# Patient Record
Sex: Female | Born: 1995 | Race: White | Hispanic: No | Marital: Married | Smoking: Former smoker
Health system: Southern US, Community
[De-identification: ages and names within clinical notes are randomized; demographics above are authoritative.]

## PROBLEM LIST (undated history)

## (undated) ENCOUNTER — Inpatient Hospital Stay (HOSPITAL_COMMUNITY): Payer: Self-pay

## (undated) DIAGNOSIS — Z872 Personal history of diseases of the skin and subcutaneous tissue: Secondary | ICD-10-CM

## (undated) DIAGNOSIS — R102 Pelvic and perineal pain unspecified side: Secondary | ICD-10-CM

## (undated) DIAGNOSIS — R591 Generalized enlarged lymph nodes: Secondary | ICD-10-CM

## (undated) DIAGNOSIS — O139 Gestational [pregnancy-induced] hypertension without significant proteinuria, unspecified trimester: Secondary | ICD-10-CM

## (undated) DIAGNOSIS — N926 Irregular menstruation, unspecified: Secondary | ICD-10-CM

## (undated) DIAGNOSIS — Z973 Presence of spectacles and contact lenses: Secondary | ICD-10-CM

## (undated) DIAGNOSIS — G8929 Other chronic pain: Secondary | ICD-10-CM

## (undated) DIAGNOSIS — N939 Abnormal uterine and vaginal bleeding, unspecified: Secondary | ICD-10-CM

## (undated) DIAGNOSIS — N83201 Unspecified ovarian cyst, right side: Secondary | ICD-10-CM

## (undated) DIAGNOSIS — N809 Endometriosis, unspecified: Secondary | ICD-10-CM

## (undated) DIAGNOSIS — N941 Unspecified dyspareunia: Secondary | ICD-10-CM

## (undated) DIAGNOSIS — Z8759 Personal history of other complications of pregnancy, childbirth and the puerperium: Secondary | ICD-10-CM

## (undated) DIAGNOSIS — N83202 Unspecified ovarian cyst, left side: Secondary | ICD-10-CM

## (undated) HISTORY — PX: TONSILLECTOMY: SUR1361

---
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
## 2013-06-06 ENCOUNTER — Encounter (HOSPITAL_COMMUNITY): Payer: Self-pay

## 2013-06-06 ENCOUNTER — Inpatient Hospital Stay (HOSPITAL_COMMUNITY)

## 2013-06-06 ENCOUNTER — Inpatient Hospital Stay (HOSPITAL_COMMUNITY)
Admission: AD | Admit: 2013-06-06 | Discharge: 2013-06-06 | Disposition: A | Discharge status: Home / Self Care | Source: Ambulatory Visit | Attending: Obstetrics and Gynecology | Admitting: Obstetrics and Gynecology

## 2013-06-06 DIAGNOSIS — Z87891 Personal history of nicotine dependence: Secondary | ICD-10-CM | POA: Insufficient documentation

## 2013-06-06 DIAGNOSIS — N83209 Unspecified ovarian cyst, unspecified side: Secondary | ICD-10-CM | POA: Insufficient documentation

## 2013-06-06 DIAGNOSIS — R1031 Right lower quadrant pain: Secondary | ICD-10-CM | POA: Insufficient documentation

## 2013-06-06 LAB — URINALYSIS, ROUTINE W REFLEX MICROSCOPIC
Bilirubin Urine: NEGATIVE
Glucose, UA: NEGATIVE mg/dL
Hgb urine dipstick: NEGATIVE
Ketones, ur: NEGATIVE mg/dL
Leukocytes, UA: NEGATIVE
Nitrite: NEGATIVE
Protein, ur: NEGATIVE mg/dL
Specific Gravity, Urine: 1.01 (ref 1.005–1.030)
Urobilinogen, UA: 0.2 mg/dL (ref 0.0–1.0)
pH: 6 (ref 5.0–8.0)

## 2013-06-06 MED ORDER — HYDROMORPHONE HCL PF 1 MG/ML IJ SOLN
1.0000 mg | Freq: Once | INTRAMUSCULAR | Status: AC
  Administered 2013-06-06: 1 mg via INTRAMUSCULAR
  Filled 2013-06-06: qty 1

## 2013-06-06 NOTE — MAU Provider Note (Signed)
History     CSN: [id number]  Arrival date and time: 06/06/13 1911   First Provider Initiated Contact with Patient 06/06/13 1945      Chief Complaint  Patient presents with  . Abdominal Pain   HPI [name] is a 17 y.o. G0P0 who presents to MAU today with complaint of RLQ pain. The patient has a known ovarian cyst seen on [place] in the office recently. She states that her pain became worse yesterday and then much worse this afternoon. She rates her pain at 8/10 now. She denies vaginal bleeding, but has noted a new clear, white discharge since earlier today. She states recent temp of 99 F earlier today, but no true fever.   OB History   Grav Para Term Preterm Abortions TAB SAB Ect Mult Living   0               Past Medical History  Diagnosis Date  . No pertinent past medical history   . Bilateral ovarian cysts     Past Surgical History  Procedure Laterality Date  . Laparoscopy  11/08/2011    Procedure: LAPAROSCOPY OPERATIVE;  Surgeon: Levi Aland, MD;  Location: [facility];  Service: Gynecology;  Laterality: Right;  . Ovarian cyst removal  11/08/2011    Procedure: OVARIAN CYSTECTOMY;  Surgeon: Levi Aland, MD;  Location: [facility];  Service: Gynecology;  Laterality: Right;    History reviewed. No pertinent family history.  History  Substance Use Topics  . Smoking status: Former Games developer  . Smokeless tobacco: Never Used  . Alcohol Use: No    Allergies:  Allergies  Allergen Reactions  . Peanuts [Peanut Oil] Anaphylaxis and Hives    Allergic to all nuts  . Latex Rash    Prescriptions prior to admission  Medication Sig Dispense Refill  . ibuprofen (ADVIL,MOTRIN) 800 MG tablet Take 1 tablet (800 mg total) by mouth 3 (three) times daily.  21 tablet  0  . HYDROcodone-acetaminophen (NORCO/VICODIN) 5-325 MG per tablet Take 1-2 tablets by mouth every 6 (six) hours as needed for pain.  20 tablet  0    Review of Systems  Constitutional: Negative for fever and  malaise/fatigue.  Gastrointestinal: Positive for nausea and abdominal pain. Negative for vomiting.  Genitourinary:       Neg - vaginal bleeding + vaginal discharge   Physical Exam   Blood pressure 138/78, pulse 106, temperature 98.6 F (37 C), temperature source Oral, resp. rate 20, height 5\' 11"  (1.803 m), weight 224 lb 6.4 oz (101.787 kg), SpO2 100.00%.  Physical Exam  Constitutional: She is oriented to person, place, and time. She appears well-developed and well-nourished. She appears distressed (appears uncomfortable).  HENT:  Head: Normocephalic and atraumatic.  Cardiovascular: Normal rate, regular rhythm and normal heart sounds.   Respiratory: Effort normal and breath sounds normal. No respiratory distress.  GI: Soft. Bowel sounds are normal. She exhibits no distension and no mass. There is tenderness (mild tenderness to palpation of the RLQ). There is no rebound and no guarding.  Neurological: She is alert and oriented to person, place, and time.  Skin: Skin is warm and dry. No erythema.  Psychiatric: She has a normal mood and affect.   Results for orders placed during the hospital encounter of 06/06/13 (from the past 24 hour(s))  URINALYSIS, ROUTINE W REFLEX MICROSCOPIC     Status: Abnormal   Collection Time    06/06/13  7:34 PM      Result Value  Range   Color, Urine STRAW (*) YELLOW   APPearance CLEAR  CLEAR   Specific Gravity, Urine 1.010  1.005 - 1.030   pH 6.0  5.0 - 8.0   Glucose, UA NEGATIVE  NEGATIVE mg/dL   Hgb urine dipstick NEGATIVE  NEGATIVE   Bilirubin Urine NEGATIVE  NEGATIVE   Ketones, ur NEGATIVE  NEGATIVE mg/dL   Protein, ur NEGATIVE  NEGATIVE mg/dL   Urobilinogen, UA 0.2  0.0 - 1.0 mg/dL   Nitrite NEGATIVE  NEGATIVE   Leukocytes, UA NEGATIVE  NEGATIVE  POCT PREGNANCY, URINE     Status: None   Collection Time    06/06/13  7:42 PM      Result Value Range   Preg Test, Ur NEGATIVE  NEGATIVE    MAU Course  Procedures None  MDM Discussed patient  with Dr. Claiborne Billings. Order pelvic US and 1 mg IM dilaudid.   2000 - Care turned over to [facility], CNM   Freddi Starr, PA-C  06/06/2013, 8:03 PM   Dorathy Kinsman, CNM assumed care of patient at 8 PM.  Pain resolved with Dilaudid.  US Transvaginal Non-ob  06/06/2013   CLINICAL DATA:  Right lower quadrant pelvic pain  EXAM: TRANSABDOMINAL AND TRANSVAGINAL ULTRASOUND OF PELVIS  TECHNIQUE: Both transabdominal and transvaginal ultrasound examinations of the pelvis were performed. Transabdominal technique was performed for global imaging of the pelvis including uterus, ovaries, adnexal regions, and pelvic cul-de-sac. It was necessary to proceed with endovaginal exam following the transabdominal exam to visualize the uterus, endometrium and the adnexa in better detail.  COMPARISON:  12/25/2012  FINDINGS: Uterus  Measurements: 6.3 x 2.8 x 3.6 cm. No fibroids or other mass visualized.  Endometrium  Thickness: 4.5 mm. No focal abnormality. Unremarkable appearance by ultrasound  Right ovary  Measurements: 2.4 x 1.4 x 1.3 cm. Normal appearance/no adnexal mass.  Left ovary  Measurements: 3.3 x 1.4 x 1.5 cm. Normal appearance/no adnexal mass.  Other findings  No free fluid.  IMPRESSION: Normal pelvic ultrasound. No acute finding.   Electronically Signed   By: Ruel Favors M.D.   On: 06/06/2013 21:08   US Pelvis Complete  06/06/2013   CLINICAL DATA:  Right lower quadrant pelvic pain  EXAM: TRANSABDOMINAL AND TRANSVAGINAL ULTRASOUND OF PELVIS  TECHNIQUE: Both transabdominal and transvaginal ultrasound examinations of the pelvis were performed. Transabdominal technique was performed for global imaging of the pelvis including uterus, ovaries, adnexal regions, and pelvic cul-de-sac. It was necessary to proceed with endovaginal exam following the transabdominal exam to visualize the uterus, endometrium and the adnexa in better detail.  COMPARISON:  12/25/2012  FINDINGS: Uterus  Measurements: 6.3 x 2.8 x 3.6 cm. No  fibroids or other mass visualized.  Endometrium  Thickness: 4.5 mm. No focal abnormality. Unremarkable appearance by ultrasound  Right ovary  Measurements: 2.4 x 1.4 x 1.3 cm. Normal appearance/no adnexal mass.  Left ovary  Measurements: 3.3 x 1.4 x 1.5 cm. Normal appearance/no adnexal mass.  Other findings  No free fluid.  IMPRESSION: Normal pelvic ultrasound. No acute finding.   Electronically Signed   By: Ruel Favors M.D.   On: 06/06/2013 21:08   Assessment and Plan   1. Ovarian cyst rupture    PLAN: Discharge home per consult with Dr. Claiborne Billings. Continue ibuprofen for pain management. Follow-up Information   Follow up with HORVATH,MICHELLE A, MD. (as scheduled or as needed if symptoms worsen)    Specialty:  Obstetrics and Gynecology   Contact information:  [address] [place] [address] [phone]       Follow up with [facility] [place] [facility]. (As needed in emergencies)    Contact information:   [address] [id number] [place] [address] [phone]       Medication List    STOP taking these medications       HYDROcodone-acetaminophen 5-325 MG per tablet  Commonly known as:  NORCO/VICODIN      TAKE these medications       ibuprofen 800 MG tablet  Commonly known as:  ADVIL,MOTRIN  Take 1 tablet (800 mg total) by mouth 3 (three) times daily.        Mariano Colan, CNM 06/07/2013 5:17 AM

## 2013-06-06 NOTE — MAU Note (Signed)
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 Fever tonight, temp 100 at home. No URI or cold symptoms. Vaginal spotting that pt relates to her birth control (nuvaring). White, thick discharge today, no odor. LMP 3 months ago, since using nuvaring.

## 2013-06-07 NOTE — MAU Provider Note (Signed)
agree

## 2013-07-03 ENCOUNTER — Emergency Department (HOSPITAL_COMMUNITY)
Admission: EM | Admit: 2013-07-03 | Discharge: 2013-07-03 | Disposition: A | Discharge status: Home / Self Care | Attending: Emergency Medicine | Admitting: Emergency Medicine

## 2013-07-03 ENCOUNTER — Emergency Department (HOSPITAL_COMMUNITY)

## 2013-07-03 ENCOUNTER — Encounter (HOSPITAL_COMMUNITY): Payer: Self-pay | Admitting: Emergency Medicine

## 2013-07-03 DIAGNOSIS — Z3202 Encounter for pregnancy test, result negative: Secondary | ICD-10-CM | POA: Insufficient documentation

## 2013-07-03 DIAGNOSIS — Z87891 Personal history of nicotine dependence: Secondary | ICD-10-CM | POA: Insufficient documentation

## 2013-07-03 DIAGNOSIS — Z791 Long term (current) use of non-steroidal anti-inflammatories (NSAID): Secondary | ICD-10-CM | POA: Insufficient documentation

## 2013-07-03 DIAGNOSIS — R197 Diarrhea, unspecified: Secondary | ICD-10-CM

## 2013-07-03 DIAGNOSIS — R109 Unspecified abdominal pain: Secondary | ICD-10-CM

## 2013-07-03 DIAGNOSIS — R1031 Right lower quadrant pain: Secondary | ICD-10-CM | POA: Insufficient documentation

## 2013-07-03 DIAGNOSIS — Z8742 Personal history of other diseases of the female genital tract: Secondary | ICD-10-CM | POA: Insufficient documentation

## 2013-07-03 DIAGNOSIS — Z91040 Latex allergy status: Secondary | ICD-10-CM | POA: Insufficient documentation

## 2013-07-03 DIAGNOSIS — R1110 Vomiting, unspecified: Secondary | ICD-10-CM

## 2013-07-03 DIAGNOSIS — R112 Nausea with vomiting, unspecified: Secondary | ICD-10-CM | POA: Insufficient documentation

## 2013-07-03 DIAGNOSIS — R509 Fever, unspecified: Secondary | ICD-10-CM | POA: Insufficient documentation

## 2013-07-03 LAB — URINE MICROSCOPIC-ADD ON

## 2013-07-03 LAB — URINALYSIS, ROUTINE W REFLEX MICROSCOPIC
Bilirubin Urine: NEGATIVE
Hgb urine dipstick: NEGATIVE
Ketones, ur: NEGATIVE mg/dL
Nitrite: NEGATIVE
Specific Gravity, Urine: 1.008 (ref 1.005–1.030)
Urobilinogen, UA: 0.2 mg/dL (ref 0.0–1.0)

## 2013-07-03 LAB — CBC WITH DIFFERENTIAL/PLATELET
Basophils Relative: 0 % (ref 0–1)
Eosinophils Absolute: 0.1 10*3/uL (ref 0.0–0.7)
Eosinophils Relative: 1 % (ref 0–5)
HCT: 33.8 % — ABNORMAL LOW (ref 36.0–46.0)
Hemoglobin: 10.8 g/dL — ABNORMAL LOW (ref 12.0–15.0)
Lymphs Abs: 2.8 10*3/uL (ref 0.7–4.0)
MCH: 24.3 pg — ABNORMAL LOW (ref 26.0–34.0)
MCHC: 32 g/dL (ref 30.0–36.0)
MCV: 76 fL — ABNORMAL LOW (ref 78.0–100.0)
Monocytes Absolute: 0.7 10*3/uL (ref 0.1–1.0)
Monocytes Relative: 9 % (ref 3–12)
RBC: 4.45 MIL/uL (ref 3.87–5.11)

## 2013-07-03 LAB — BASIC METABOLIC PANEL
BUN: 7 mg/dL (ref 6–23)
Calcium: 9.3 mg/dL (ref 8.4–10.5)
Creatinine, Ser: 0.73 mg/dL (ref 0.50–1.10)
GFR calc Af Amer: >90 mL/min (ref >90)
GFR calc non Af Amer: >90 mL/min (ref >90)
Glucose, Bld: 114 mg/dL — ABNORMAL HIGH (ref 70–99)
Potassium: 3.3 mEq/L — ABNORMAL LOW (ref 3.5–5.1)

## 2013-07-03 MED ORDER — IOHEXOL 300 MG/ML  SOLN
50.0000 mL | Freq: Once | INTRAMUSCULAR | Status: AC | PRN
  Administered 2013-07-03: 50 mL via INTRAVENOUS

## 2013-07-03 MED ORDER — HYDROCODONE-ACETAMINOPHEN 5-325 MG PO TABS: 2.0000 | ORAL_TABLET | ORAL | Status: DC | PRN

## 2013-07-03 MED ORDER — SODIUM CHLORIDE 0.9 % IV BOLUS (SEPSIS)
1000.0000 mL | Freq: Once | INTRAVENOUS | Status: AC
  Administered 2013-07-03: 1000 mL via INTRAVENOUS

## 2013-07-03 MED ORDER — ONDANSETRON HCL 4 MG/2ML IJ SOLN
4.0000 mg | Freq: Once | INTRAMUSCULAR | Status: AC
  Administered 2013-07-03: 4 mg via INTRAVENOUS
  Filled 2013-07-03: qty 2

## 2013-07-03 MED ORDER — ONDANSETRON HCL 4 MG PO TABS: 4.0000 mg | ORAL_TABLET | Freq: Four times a day (QID) | ORAL | Status: DC

## 2013-07-03 MED ORDER — MORPHINE SULFATE 4 MG/ML IJ SOLN
4.0000 mg | Freq: Once | INTRAMUSCULAR | Status: AC
  Administered 2013-07-03: 4 mg via INTRAVENOUS
  Filled 2013-07-03: qty 1

## 2013-07-03 MED ORDER — DIPHENOXYLATE-ATROPINE 2.5-0.025 MG PO TABS: 2.0000 | ORAL_TABLET | Freq: Four times a day (QID) | ORAL | Status: DC | PRN

## 2013-07-03 MED ORDER — IOHEXOL 300 MG/ML  SOLN
100.0000 mL | Freq: Once | INTRAMUSCULAR | Status: AC | PRN
  Administered 2013-07-03: 100 mL via INTRAVENOUS

## 2013-07-03 NOTE — ED Notes (Signed)
Pt has right sided abd pain and vomiting since Sunday has been nauseous every since 28th of Nov. States eating makes the pain worse. [facility] [address] and was told to come hereto rule out appendicitis.

## 2013-07-03 NOTE — ED Provider Notes (Signed)
CSN: [id number]     Arrival date & time 07/03/13  1718 History   First MD Initiated Contact with Patient 07/03/13 1757     Chief Complaint  Patient presents with  . Abdominal Pain  . Nausea  . Emesis   (Consider location/radiation/quality/duration/timing/severity/associated sxs/prior Treatment) HPI Comments: Patient presents to the ER for evaluation of right-sided abdominal pain. Patient has had 2 days of abdominal pain with nausea, vomiting and slight diarrhea. Initially the pain was around her umbilicus area, and now in the right lower area of the abdomen. Pain has been progressively worsening, now moderate to severe. Pain is continuous. She has been running a fever as high as 101.  Patient is a 17 y.o. female presenting with abdominal pain and vomiting.  Abdominal Pain Associated symptoms: diarrhea, fever, nausea and vomiting   Emesis Associated symptoms: abdominal pain and diarrhea     Past Medical History  Diagnosis Date  . No pertinent past medical history   . Bilateral ovarian cysts    Past Surgical History  Procedure Laterality Date  . Laparoscopy  11/08/2011    Procedure: LAPAROSCOPY OPERATIVE;  Surgeon: Levi Aland, MD;  Location: [facility];  Service: Gynecology;  Laterality: Right;  . Ovarian cyst removal  11/08/2011    Procedure: OVARIAN CYSTECTOMY;  Surgeon: Levi Aland, MD;  Location: [facility];  Service: Gynecology;  Laterality: Right;   No family history on file. History  Substance Use Topics  . Smoking status: Former Games developer  . Smokeless tobacco: Never Used  . Alcohol Use: No   OB History   Grav Para Term Preterm Abortions TAB SAB Ect Mult Living   0              Review of Systems  Constitutional: Positive for fever.  Gastrointestinal: Positive for nausea, vomiting, abdominal pain and diarrhea.    Allergies  Peanuts and Latex  Home Medications   Current Outpatient Rx  Name  Route  Sig  Dispense  Refill  . ibuprofen (ADVIL,MOTRIN) 800 MG tablet  Oral   Take 1 tablet (800 mg total) by mouth 3 (three) times daily.   21 tablet   0    BP 143/73  Pulse 118  Temp(Src) 98.4 F (36.9 C) (Oral)  Resp 20  SpO2 100% Physical Exam  Constitutional: She is oriented to person, place, and time. She appears well-developed and well-nourished. No distress.  HENT:  Head: Normocephalic and atraumatic.  Right Ear: Hearing normal.  Left Ear: Hearing normal.  Nose: Nose normal.  Mouth/Throat: Oropharynx is clear and moist and mucous membranes are normal.  Eyes: Conjunctivae and EOM are normal. Pupils are equal, round, and reactive to light.  Neck: Normal range of motion. Neck supple.  Cardiovascular: Regular rhythm, S1 normal and S2 normal.  Exam reveals no gallop and no friction rub.   No murmur heard. Pulmonary/Chest: Effort normal and breath sounds normal. No respiratory distress. She exhibits no tenderness.  Abdominal: Soft. Normal appearance and bowel sounds are normal. There is no hepatosplenomegaly. There is tenderness in the right lower quadrant. There is guarding. There is no rebound, no tenderness at McBurney's point and negative Murphy's sign. No hernia.  Musculoskeletal: Normal range of motion.  Neurological: She is alert and oriented to person, place, and time. She has normal strength. No cranial nerve deficit or sensory deficit. Coordination normal. GCS eye subscore is 4. GCS verbal subscore is 5. GCS motor subscore is 6.  Skin: Skin is warm, dry and intact.  No rash noted. No cyanosis.  Psychiatric: She has a normal mood and affect. Her speech is normal and behavior is normal. Thought content normal.    ED Course  Procedures (including critical care time) Labs Review Labs Reviewed  CBC WITH DIFFERENTIAL - Abnormal; Notable for the following:    Hemoglobin 10.8 (*)    HCT 33.8 (*)    MCV 76.0 (*)    MCH 24.3 (*)    All other components within normal limits  BASIC METABOLIC PANEL - Abnormal; Notable for the following:     Potassium 3.3 (*)    Glucose, Bld 114 (*)    All other components within normal limits  URINALYSIS, ROUTINE W REFLEX MICROSCOPIC - Abnormal; Notable for the following:    Leukocytes, UA TRACE (*)    All other components within normal limits  URINE MICROSCOPIC-ADD ON - Abnormal; Notable for the following:    Bacteria, UA FEW (*)    All other components within normal limits  POCT PREGNANCY, URINE   Imaging Review Ct Abdomen Pelvis W Contrast  07/03/2013   CLINICAL DATA:  Right-sided abdomen pain  EXAM: CT ABDOMEN AND PELVIS WITH CONTRAST  TECHNIQUE: Multidetector CT imaging of the abdomen and pelvis was performed using the standard protocol following bolus administration of intravenous contrast.  CONTRAST:  50mL OMNIPAQUE IOHEXOL 300 MG/ML SOLN, OMNIPAQUE IOHEXOL 300 MG/ML SOLN  COMPARISON:  September 13, 2012  FINDINGS: The liver, spleen, pancreas, gallbladder, adrenal glands and kidneys are normal. The aorta is normal. There is no abdominal lymphadenopathy. There is no small bowel obstruction or diverticulitis. The appendix is normal. There is minimal umbilical herniation of mesenteric fat.  Fluid-filled bladder is normal. The uterus is normal. The bilateral ovaries are normal. Minimal free fluid is identified in the right pelvis, likely physiologic. The lung bases are clear. No acute abnormalities identified within the visualized bones.  IMPRESSION: Normal appendix. No acute abnormality identified in the abdomen and pelvis. Minimal free fluid in the right pelvis, likely physiologic.   Electronically Signed   By: Sherian Rein M.D.   On: 07/03/2013 20:28    EKG Interpretation   None       MDM  Diagnsosis: Abdominal pain, vomiting, diarrhea  Patient presents to the ER for evaluation of fever, abdominal pain. Patient reports that the pain was initially diffuse in the middle of the abdomen, now in the lower abdomen, currently on the right. She has been sick for 2 or 3 days including fever.  She has had GI symptoms including vomiting and diarrhea. She feels constipated. Based on the presentation and the area of tenderness, appendicitis was considered. CAT scan was performed and shows no evidence of acute appendicitis. Appendix was normal. There were no abnormalities seen.  Patient has had previous cyst. Based on the fever or GI symptoms, however, do not suspect gynecologic origin. Symptoms are consistent with likely viral syndrome. She was hydrated in the ER. She will be continued on Zofran, Lomotil as needed.    Gilda Crease, MD 07/03/13 2045

## 2013-07-03 NOTE — ED Notes (Signed)
Patient back from CT.  Patient in NAD.

## 2013-07-03 NOTE — ED Notes (Signed)
MD informed that patient states "The Morphine really didn't help me any."

## 2013-07-03 NOTE — ED Notes (Signed)
Assumed care of patient Patient up to bathroom to provide urine specimen Patient states that pain still present, but has decreased since admin of medication, see MAR Warm blanket given Patient and pt's visitor deny further needs at this time Side rails up, call bell in reach

## 2013-07-03 NOTE — Progress Notes (Signed)
   CARE MANAGEMENT ED NOTE 07/03/2013  Patient:  Chelsea Day, Chelsea Day   Account Number:  [id number]  Date Initiated:  07/03/2013  Documentation initiated by:  Radford Pax  Subjective/Objective Assessment:   Patient presents to Ed with abdominal pain and nausea and vomitting.     Subjective/Objective Assessment Detail:     Action/Plan:   Action/Plan Detail:   Anticipated DC Date:       Status Recommendation to Physician:   Result of Recommendation:    Other ED Services  Consult Working Plan    DC Planning Services  Other  PCP issues    Choice offered to / List presented to:            Status of service:  Completed, signed off  ED Comments:   ED Comments Detail:  EDCM spoke to patient at bedside.  Patient reports she does not have a pcp.  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  EDCM instructed patient to call the phone number on back of insurance card or go to insurance company website to help her find a physician who is close to her and within network.

## 2013-07-03 NOTE — ED Notes (Signed)
MD at bedside. 

## 2013-08-20 ENCOUNTER — Inpatient Hospital Stay (HOSPITAL_COMMUNITY)
Admission: AD | Admit: 2013-08-20 | Discharge: 2013-08-20 | Disposition: A | Discharge status: Home / Self Care | Source: Ambulatory Visit | Attending: Obstetrics and Gynecology | Admitting: Obstetrics and Gynecology

## 2013-08-20 ENCOUNTER — Inpatient Hospital Stay (HOSPITAL_COMMUNITY)

## 2013-08-20 ENCOUNTER — Encounter (HOSPITAL_COMMUNITY): Payer: Self-pay | Admitting: *Deleted

## 2013-08-20 DIAGNOSIS — R300 Dysuria: Secondary | ICD-10-CM | POA: Insufficient documentation

## 2013-08-20 DIAGNOSIS — Z87891 Personal history of nicotine dependence: Secondary | ICD-10-CM | POA: Insufficient documentation

## 2013-08-20 DIAGNOSIS — R350 Frequency of micturition: Secondary | ICD-10-CM | POA: Insufficient documentation

## 2013-08-20 DIAGNOSIS — R1031 Right lower quadrant pain: Secondary | ICD-10-CM

## 2013-08-20 HISTORY — DX: Irregular menstruation, unspecified: N92.6

## 2013-08-20 LAB — WET PREP, GENITAL
CLUE CELLS WET PREP: NONE SEEN
Trich, Wet Prep: NONE SEEN
YEAST WET PREP: NONE SEEN

## 2013-08-20 LAB — POCT PREGNANCY, URINE: Preg Test, Ur: NEGATIVE

## 2013-08-20 LAB — URINALYSIS, ROUTINE W REFLEX MICROSCOPIC
Bilirubin Urine: NEGATIVE
GLUCOSE, UA: NEGATIVE mg/dL
KETONES UR: NEGATIVE mg/dL
Leukocytes, UA: NEGATIVE
Nitrite: NEGATIVE
PH: 6 (ref 5.0–8.0)
Protein, ur: NEGATIVE mg/dL
SPECIFIC GRAVITY, URINE: 1.01 (ref 1.005–1.030)
Urobilinogen, UA: 0.2 mg/dL (ref 0.0–1.0)

## 2013-08-20 LAB — URINE MICROSCOPIC-ADD ON

## 2013-08-20 MED ORDER — OXYCODONE-ACETAMINOPHEN 5-325 MG PO TABS: 1.0000 | ORAL_TABLET | ORAL | Status: DC | PRN

## 2013-08-20 MED ORDER — OXYCODONE-ACETAMINOPHEN 5-325 MG PO TABS
1.0000 | ORAL_TABLET | Freq: Once | ORAL | Status: AC
  Administered 2013-08-20: 1 via ORAL
  Filled 2013-08-20: qty 1

## 2013-08-20 NOTE — MAU Provider Note (Signed)
History     CSN: [id number]  Arrival date and time: 08/20/13 2017   First Provider Initiated Contact with Patient 08/20/13 2057      No chief complaint on file.  HPI [name] is a 18 y.o. G0P0 who presents to MAU today with complaint of RLQ pain and "black-red discharge." The patient states that she uses Nuvaring continuously for bleeding control and history of ovarian cysts. She usually has light pink spotting at some point during the month, but has never noted this kind of darker discharge and not in this quantity. She states that she had to use 2 tampons earlier today. She is also complaining of increased frequency and dysuria. She denies fever or flank pain. The patient states that she is virgin.   OB History   Grav Para Term Preterm Abortions TAB SAB Ect Mult Living   0               Past Medical History  Diagnosis Date  . No pertinent past medical history   . Bilateral ovarian cysts   . Irregular periods/menstrual cycles     Past Surgical History  Procedure Laterality Date  . Laparoscopy  11/08/2011    Procedure: LAPAROSCOPY OPERATIVE;  Surgeon: Olga Millers, MD;  Location: [place] [facility];  Service: Gynecology;  Laterality: Right;  . Ovarian cyst removal  11/08/2011    Procedure: OVARIAN CYSTECTOMY;  Surgeon: Olga Millers, MD;  Location: [place] [facility];  Service: Gynecology;  Laterality: Right;    History reviewed. No pertinent family history.  History  Substance Use Topics  . Smoking status: Former Smoker    Types: Cigarettes  . Smokeless tobacco: Never Used  . Alcohol Use: No    Allergies:  Allergies  Allergen Reactions  . Peanuts [Peanut Oil] Anaphylaxis and Hives    Allergic to all nuts  . Latex Rash    Prescriptions prior to admission  Medication Sig Dispense Refill  . ibuprofen (ADVIL,MOTRIN) 800 MG tablet Take 800 mg by mouth every 8 (eight) hours as needed for fever, headache, mild pain, moderate pain or cramping.      . [DISCONTINUED] ibuprofen  (ADVIL,MOTRIN) 800 MG tablet Take 1 tablet (800 mg total) by mouth 3 (three) times daily.  21 tablet  0    Review of Systems  Constitutional: Negative for fever and malaise/fatigue.  Gastrointestinal: Positive for nausea and abdominal pain. Negative for vomiting, diarrhea and constipation.  Genitourinary: Positive for dysuria, urgency and frequency. Negative for hematuria and flank pain.       + vaginal bleeding Neg - vaginal discharge   Physical Exam   Blood pressure 135/74, pulse 78, temperature 98.7 F (37.1 C), temperature source Oral, resp. rate 78, height 5\' 10"  (1.778 m), weight 220 lb (99.791 kg).  Physical Exam  Constitutional: She is oriented to person, place, and time. She appears well-developed and well-nourished. No distress.  HENT:  Head: Normocephalic and atraumatic.  Cardiovascular: Normal rate.   Respiratory: Effort normal.  GI: Soft. Bowel sounds are normal. She exhibits no distension and no mass. There is no tenderness. There is no rebound, no guarding and no CVA tenderness.  Genitourinary: There is bleeding (scant dark brown blood noted) around the vagina. No vaginal discharge found.  Neurological: She is alert and oriented to person, place, and time.  Skin: Skin is warm and dry. No erythema.  Psychiatric: She has a normal mood and affect.   Results for orders placed during the hospital encounter of 08/20/13 (  from the past 24 hour(s))  URINALYSIS, ROUTINE W REFLEX MICROSCOPIC     Status: Abnormal   Collection Time    08/20/13  8:21 PM      Result Value Range   Color, Urine YELLOW  YELLOW   APPearance CLEAR  CLEAR   Specific Gravity, Urine 1.010  1.005 - 1.030   pH 6.0  5.0 - 8.0   Glucose, UA NEGATIVE  NEGATIVE mg/dL   Hgb urine dipstick LARGE (*) NEGATIVE   Bilirubin Urine NEGATIVE  NEGATIVE   Ketones, ur NEGATIVE  NEGATIVE mg/dL   Protein, ur NEGATIVE  NEGATIVE mg/dL   Urobilinogen, UA 0.2  0.0 - 1.0 mg/dL   Nitrite NEGATIVE  NEGATIVE   Leukocytes, UA  NEGATIVE  NEGATIVE  URINE MICROSCOPIC-ADD ON     Status: None   Collection Time    08/20/13  8:21 PM      Result Value Range   Squamous Epithelial / LPF RARE  RARE   WBC, UA 0-2  <3 WBC/hpf   RBC / HPF 0-2  <3 RBC/hpf   Bacteria, UA RARE  RARE  POCT PREGNANCY, URINE     Status: None   Collection Time    08/20/13  8:30 PM      Result Value Range   Preg Test, Ur NEGATIVE  NEGATIVE  WET PREP, GENITAL     Status: Abnormal   Collection Time    08/20/13  9:05 PM      Result Value Range   Yeast Wet Prep HPF POC NONE SEEN  NONE SEEN   Trich, Wet Prep NONE SEEN  NONE SEEN   Clue Cells Wet Prep HPF POC NONE SEEN  NONE SEEN   WBC, Wet Prep HPF POC FEW (*) NONE SEEN   US Transvaginal Non-ob  08/20/2013   CLINICAL DATA:  Right lower quadrant abdominal pain.  EXAM: TRANSVAGINAL ULTRASOUND OF PELVIS  TECHNIQUE: Transvaginal ultrasound examination of the pelvis was performed including evaluation of the uterus, ovaries, adnexal regions, and pelvic cul-de-sac.  COMPARISON:  CT ABD/PELVIS W CM dated 07/03/2013; US PELVIS COMPLETE dated 06/06/2013  FINDINGS: Uterus  Measurements: 5.4 x 3.1 x 4.1 cm. No fibroids or other mass visualized.  Endometrium  Thickness: 0.5 cm.  No focal abnormality visualized.  Right ovary  Measurements: 2.1 x 1.1 x 1.7 cm. Normal appearance/no adnexal mass.  Left ovary  Measurements: 2.1 x 1.5 x 1.7 cm. Normal appearance/no adnexal mass.  Other findings: A small amount of free fluid is seen within the pelvic cul-de-sac.  IMPRESSION: Unremarkable pelvic ultrasound.   Electronically Signed   By: Garald Balding M.D.   On: 08/20/2013 22:53   US Pelvis Limited  08/20/2013   CLINICAL DATA:  Right lower quadrant abdominal pain.  EXAM: LIMITED ABDOMINAL ULTRASOUND  TECHNIQUE: Pearline Cables scale imaging of the right lower quadrant was performed to evaluate for suspected appendicitis. Standard imaging planes and graded compression technique were utilized.  COMPARISON:  None.  FINDINGS: The appendix is  not visualized.  Ancillary findings: None.  Factors affecting image quality: Patient habitus and overlying bowel loops.  IMPRESSION: No appendix, focal fluid collection or other abnormality seen. Evaluation limited due to the patient's habitus and overlying bowel loops.   Electronically Signed   By: Garald Balding M.D.   On: 08/20/2013 22:47     MAU Course  Procedures None  MDM UPT - negative UA and wet prep today Ordered Pelvic and abdominal US Dr. Harrington Challenger called to MAU. She has  reviewed the patient's chart and noted: 05/2013 - patient was seen in her office and had [place] that showed a ? Dermoid cyst 06/2013 - patient was seen in MAU and admitted for a rupture ovarian cyst 07/2013 - patient seen at [facility] for RLQ pain. Patient had abdominal CT scan that was normal and showed no cyst Assessment and Plan  A: Abdominal pain, RLQ  P: Discharge home Rx for Percocet given to the patient Patient advised to increase PO hydration as tolerated Patient advised to keep appointment for follow-up with Dr. Philis Pique on 08/29/13 or call for earlier appointment if symptoms worsen Patient may return to MAU as needed or if her condition were to change or worsen  Farris Has, PA-C  08/20/2013, 11:56 PM

## 2013-08-20 NOTE — MAU Note (Signed)
Pt c.o black/red discharge for the past few days with  Lower abd pain that started today; took 800mg  ibuprofen without any relief.

## 2013-08-20 NOTE — Discharge Instructions (Signed)
Abdominal Pain, Women °Abdominal (stomach, pelvic, or belly) pain can be caused by many things. It is important to tell your doctor: °· The location of the pain. °· Does it come and go or is it present all the time? °· Are there things that start the pain (eating certain foods, exercise)? °· Are there other symptoms associated with the pain (fever, nausea, vomiting, diarrhea)? °All of this is helpful to know when trying to find the cause of the pain. °CAUSES  °· Stomach: virus or bacteria infection, or ulcer. °· Intestine: appendicitis (inflamed appendix), regional ileitis (Crohn's disease), ulcerative colitis (inflamed colon), irritable bowel syndrome, diverticulitis (inflamed diverticulum of the colon), or cancer of the stomach or intestine. °· Gallbladder disease or stones in the gallbladder. °· Kidney disease, kidney stones, or infection. °· Pancreas infection or cancer. °· Fibromyalgia (pain disorder). °· Diseases of the female organs: °· Uterus: fibroid (non-cancerous) tumors or infection. °· Fallopian tubes: infection or tubal pregnancy. °· Ovary: cysts or tumors. °· Pelvic adhesions (scar tissue). °· Endometriosis (uterus lining tissue growing in the pelvis and on the pelvic organs). °· Pelvic congestion syndrome (female organs filling up with blood just before the menstrual period). °· Pain with the menstrual period. °· Pain with ovulation (producing an egg). °· Pain with an IUD (intrauterine device, birth control) in the uterus. °· Cancer of the female organs. °· Functional pain (pain not caused by a disease, may improve without treatment). °· Psychological pain. °· Depression. °DIAGNOSIS  °Your doctor will decide the seriousness of your pain by doing an examination. °· Blood tests. °· X-rays. °· Ultrasound. °· CT scan (computed tomography, special type of X-ray). °· MRI (magnetic resonance imaging). °· Cultures, for infection. °· Barium enema (dye inserted in the large intestine, to better view it with  X-rays). °· Colonoscopy (looking in intestine with a lighted tube). °· Laparoscopy (minor surgery, looking in abdomen with a lighted tube). °· Major abdominal exploratory surgery (looking in abdomen with a large incision). °TREATMENT  °The treatment will depend on the cause of the pain.  °· Many cases can be observed and treated at home. °· Over-the-counter medicines recommended by your caregiver. °· Prescription medicine. °· Antibiotics, for infection. °· Birth control pills, for painful periods or for ovulation pain. °· Hormone treatment, for endometriosis. °· Nerve blocking injections. °· Physical therapy. °· Antidepressants. °· Counseling with a psychologist or psychiatrist. °· Minor or major surgery. °HOME CARE INSTRUCTIONS  °· Do not take laxatives, unless directed by your caregiver. °· Take over-the-counter pain medicine only if ordered by your caregiver. Do not take aspirin because it can cause an upset stomach or bleeding. °· Try a clear liquid diet (broth or water) as ordered by your caregiver. Slowly move to a bland diet, as tolerated, if the pain is related to the stomach or intestine. °· Have a thermometer and take your temperature several times a day, and record it. °· Bed rest and sleep, if it helps the pain. °· Avoid sexual intercourse, if it causes pain. °· Avoid stressful situations. °· Keep your follow-up appointments and tests, as your caregiver orders. °· If the pain does not go away with medicine or surgery, you may try: °· Acupuncture. °· Relaxation exercises (yoga, meditation). °· Group therapy. °· Counseling. °SEEK MEDICAL CARE IF:  °· You notice certain foods cause stomach pain. °· Your home care treatment is not helping your pain. °· You need stronger pain medicine. °· You want your IUD removed. °· You feel faint or   lightheaded. °· You develop nausea and vomiting. °· You develop a rash. °· You are having side effects or an allergy to your medicine. °SEEK IMMEDIATE MEDICAL CARE IF:  °· Your  pain does not go away or gets worse. °· You have a fever. °· Your pain is felt only in portions of the abdomen. The right side could possibly be appendicitis. The left lower portion of the abdomen could be colitis or diverticulitis. °· You are passing blood in your stools (bright red or black tarry stools, with or without vomiting). °· You have blood in your urine. °· You develop chills, with or without a fever. °· You pass out. °MAKE SURE YOU:  °· Understand these instructions. °· Will watch your condition. °· Will get help right away if you are not doing well or get worse. °Document Released: 05/16/2007 Document Revised: 10/11/2011 Document Reviewed: 06/05/2009 °[employer]® Patient Information ©2014 [employer]. ° °

## 2013-09-14 ENCOUNTER — Ambulatory Visit: Admitting: Physician Assistant

## 2014-09-22 ENCOUNTER — Encounter (HOSPITAL_COMMUNITY): Payer: Self-pay

## 2014-09-22 ENCOUNTER — Emergency Department (HOSPITAL_COMMUNITY)
Admission: EM | Admit: 2014-09-22 | Discharge: 2014-09-23 | Disposition: A | Discharge status: Home / Self Care | Payer: BLUE CROSS/BLUE SHIELD | Attending: Emergency Medicine | Admitting: Emergency Medicine

## 2014-09-22 DIAGNOSIS — N830 Follicular cyst of ovary: Secondary | ICD-10-CM | POA: Diagnosis not present

## 2014-09-22 DIAGNOSIS — Z91040 Latex allergy status: Secondary | ICD-10-CM | POA: Diagnosis not present

## 2014-09-22 DIAGNOSIS — Z87891 Personal history of nicotine dependence: Secondary | ICD-10-CM | POA: Diagnosis not present

## 2014-09-22 DIAGNOSIS — R1031 Right lower quadrant pain: Secondary | ICD-10-CM | POA: Diagnosis present

## 2014-09-22 DIAGNOSIS — N83209 Unspecified ovarian cyst, unspecified side: Secondary | ICD-10-CM

## 2014-09-22 DIAGNOSIS — R109 Unspecified abdominal pain: Secondary | ICD-10-CM

## 2014-09-22 NOTE — ED Notes (Signed)
Pt presents with c/o abdominal pain and nausea that started approx 3 days ago. Pt denies any vomiting, reports her stools have been looser than normal. Pt ambulatory to triage.

## 2014-09-23 ENCOUNTER — Emergency Department (HOSPITAL_COMMUNITY): Payer: BLUE CROSS/BLUE SHIELD

## 2014-09-23 ENCOUNTER — Encounter (HOSPITAL_COMMUNITY): Payer: Self-pay

## 2014-09-23 LAB — COMPREHENSIVE METABOLIC PANEL
ALBUMIN: 4 g/dL (ref 3.5–5.2)
ALT: 41 U/L — AB (ref 0–35)
ANION GAP: 7 (ref 5–15)
AST: 33 U/L (ref 0–37)
Alkaline Phosphatase: 81 U/L (ref 39–117)
BILIRUBIN TOTAL: 0.4 mg/dL (ref 0.3–1.2)
BUN: 6 mg/dL (ref 6–23)
CALCIUM: 9.2 mg/dL (ref 8.4–10.5)
CO2: 23 mmol/L (ref 19–32)
CREATININE: 0.6 mg/dL (ref 0.50–1.10)
Chloride: 109 mmol/L (ref 96–112)
GFR calc non Af Amer: >90 mL/min (ref >90)
Glucose, Bld: 130 mg/dL — ABNORMAL HIGH (ref 70–99)
Potassium: 3.7 mmol/L (ref 3.5–5.1)
Sodium: 139 mmol/L (ref 135–145)
Total Protein: 7.2 g/dL (ref 6.0–8.3)

## 2014-09-23 LAB — CBC WITH DIFFERENTIAL/PLATELET
BASOS PCT: 1 % (ref 0–1)
Basophils Absolute: 0.1 10*3/uL (ref 0.0–0.1)
EOS ABS: 0.3 10*3/uL (ref 0.0–0.7)
Eosinophils Relative: 2 % (ref 0–5)
HEMATOCRIT: 38.9 % (ref 36.0–46.0)
Hemoglobin: 12.3 g/dL (ref 12.0–15.0)
Lymphocytes Relative: 46 % (ref 12–46)
Lymphs Abs: 6.2 10*3/uL — ABNORMAL HIGH (ref 0.7–4.0)
MCH: 26.2 pg (ref 26.0–34.0)
MCHC: 31.6 g/dL (ref 30.0–36.0)
MCV: 82.9 fL (ref 78.0–100.0)
Monocytes Absolute: 0.8 10*3/uL (ref 0.1–1.0)
Monocytes Relative: 6 % (ref 3–12)
NEUTROS ABS: 6 10*3/uL (ref 1.7–7.7)
Neutrophils Relative %: 45 % (ref 43–77)
Platelets: 358 10*3/uL (ref 150–400)
RBC: 4.69 MIL/uL (ref 3.87–5.11)
RDW: 14.9 % (ref 11.5–15.5)
WBC: 13.4 10*3/uL — ABNORMAL HIGH (ref 4.0–10.5)

## 2014-09-23 LAB — URINALYSIS, ROUTINE W REFLEX MICROSCOPIC
Bilirubin Urine: NEGATIVE
GLUCOSE, UA: NEGATIVE mg/dL
HGB URINE DIPSTICK: NEGATIVE
KETONES UR: NEGATIVE mg/dL
Leukocytes, UA: NEGATIVE
NITRITE: NEGATIVE
PH: 6.5 (ref 5.0–8.0)
Protein, ur: NEGATIVE mg/dL
SPECIFIC GRAVITY, URINE: 1.016 (ref 1.005–1.030)
Urobilinogen, UA: 1 mg/dL (ref 0.0–1.0)

## 2014-09-23 LAB — I-STAT BETA HCG BLOOD, ED (MC, WL, AP ONLY): I-stat hCG, quantitative: <5 m[IU]/mL (ref <5)

## 2014-09-23 LAB — PATHOLOGIST SMEAR REVIEW

## 2014-09-23 MED ORDER — HYDROCODONE-ACETAMINOPHEN 5-325 MG PO TABS
2.0000 | ORAL_TABLET | Freq: Once | ORAL | Status: AC
  Administered 2014-09-23: 2 via ORAL
  Filled 2014-09-23: qty 2

## 2014-09-23 MED ORDER — IOHEXOL 300 MG/ML  SOLN
100.0000 mL | Freq: Once | INTRAMUSCULAR | Status: AC | PRN
  Administered 2014-09-23: 100 mL via INTRAVENOUS

## 2014-09-23 MED ORDER — IBUPROFEN 600 MG PO TABS: 600.0000 mg | ORAL_TABLET | Freq: Four times a day (QID) | ORAL | Status: DC | PRN

## 2014-09-23 MED ORDER — HYDROMORPHONE HCL 1 MG/ML IJ SOLN
1.0000 mg | Freq: Once | INTRAMUSCULAR | Status: AC
  Administered 2014-09-23: 1 mg via INTRAVENOUS
  Filled 2014-09-23: qty 1

## 2014-09-23 MED ORDER — SODIUM CHLORIDE 0.9 % IV BOLUS (SEPSIS)
1000.0000 mL | Freq: Once | INTRAVENOUS | Status: AC
  Administered 2014-09-23: 1000 mL via INTRAVENOUS

## 2014-09-23 MED ORDER — ONDANSETRON 8 MG PO TBDP: 8.0000 mg | ORAL_TABLET | Freq: Three times a day (TID) | ORAL | Status: DC | PRN

## 2014-09-23 MED ORDER — HYDROCODONE-ACETAMINOPHEN 5-325 MG PO TABS: 1.0000 | ORAL_TABLET | Freq: Four times a day (QID) | ORAL | Status: DC | PRN

## 2014-09-23 MED ORDER — ONDANSETRON HCL 4 MG/2ML IJ SOLN
4.0000 mg | Freq: Once | INTRAMUSCULAR | Status: AC
  Administered 2014-09-23: 4 mg via INTRAVENOUS
  Filled 2014-09-23: qty 2

## 2014-09-23 MED ORDER — MORPHINE SULFATE 4 MG/ML IJ SOLN
4.0000 mg | Freq: Once | INTRAMUSCULAR | Status: AC
  Administered 2014-09-23: 4 mg via INTRAVENOUS
  Filled 2014-09-23: qty 1

## 2014-09-23 MED ORDER — IOHEXOL 300 MG/ML  SOLN
50.0000 mL | Freq: Once | INTRAMUSCULAR | Status: AC | PRN
  Administered 2014-09-23: 50 mL via ORAL

## 2014-09-23 NOTE — ED Provider Notes (Addendum)
CSN: [id number]     Arrival date & time 09/22/14  2327 History   First MD Initiated Contact with Patient 09/23/14 0015     Chief Complaint  Patient presents with  . Abdominal Pain  . Nausea     (Consider location/radiation/quality/duration/timing/severity/associated sxs/prior Treatment) HPI Comments: Pt comes in with cc of abd pain and nausea. Pt started having pain 3 days ago. Pt has hx of ovarian cyst. Current pain is different. Pain is located in the RLQ, and initially was intermittent  More diffuse, but now constant, and more focal and severe. Patient also has been having nausea and anorexia. There is no vaginal discharge. Pt is here with her partner, and has no hx of STD, vaginal discharge, bleeding. PT has no uti like sx.   ROS 10 Systems reviewed and are negative for acute change except as noted in the HPI.     Patient is a 19 y.o. female presenting with abdominal pain. The history is provided by the patient.  Abdominal Pain Associated symptoms: nausea   Associated symptoms: no vomiting     Past Medical History  Diagnosis Date  . No pertinent past medical history   . Bilateral ovarian cysts   . Irregular periods/menstrual cycles    Past Surgical History  Procedure Laterality Date  . Laparoscopy  11/08/2011    Procedure: LAPAROSCOPY OPERATIVE;  Surgeon: Olga Millers, MD;  Location: [facility];  Service: Gynecology;  Laterality: Right;  . Ovarian cyst removal  11/08/2011    Procedure: OVARIAN CYSTECTOMY;  Surgeon: Olga Millers, MD;  Location: [facility];  Service: Gynecology;  Laterality: Right;   No family history on file. History  Substance Use Topics  . Smoking status: Former Smoker    Types: Cigarettes  . Smokeless tobacco: Never Used  . Alcohol Use: No   OB History    Gravida Para Term Preterm AB TAB SAB Ectopic Multiple Living   0              Review of Systems  Gastrointestinal: Positive for nausea and abdominal pain. Negative for vomiting.  All other  systems reviewed and are negative.     Allergies  Peanuts and Latex  Home Medications   Prior to Admission medications   Medication Sig Start Date End Date Taking? Authorizing Provider  HYDROcodone-acetaminophen (NORCO/VICODIN) 5-325 MG per tablet Take 1 tablet by mouth every 6 (six) hours as needed. 09/23/14   Varney Biles, MD  ibuprofen (ADVIL,MOTRIN) 600 MG tablet Take 1 tablet (600 mg total) by mouth every 6 (six) hours as needed. 09/23/14   Varney Biles, MD  ondansetron (ZOFRAN ODT) 8 MG disintegrating tablet Take 1 tablet (8 mg total) by mouth every 8 (eight) hours as needed for nausea. 09/23/14   Varney Biles, MD  oxyCODONE-acetaminophen (PERCOCET/ROXICET) 5-325 MG per tablet Take 1-2 tablets by mouth every 4 (four) hours as needed for severe pain. Patient not taking: Reported on 09/23/2014 08/20/13   Luvenia Redden, PA-C   BP 111/45 mmHg  Pulse 81  Temp(Src) 98.1 F (36.7 C) (Oral)  Resp 18  SpO2 100%  LMP 09/11/2014 Physical Exam  Constitutional: She appears well-developed and well-nourished.  HENT:  Head: Normocephalic and atraumatic.  Eyes: EOM are normal. Pupils are equal, round, and reactive to light.  Neck: Neck supple.  Cardiovascular: Normal rate, regular rhythm and normal heart sounds.   No murmur heard. Pulmonary/Chest: Effort normal. No respiratory distress.  Abdominal: Soft. She exhibits no distension. There is tenderness.  There is no rebound and no guarding.  RLQ tenderness, including at Mcburney's point  Skin: Skin is warm and dry.  Nursing note and vitals reviewed.   ED Course  Procedures (including critical care time) Labs Review Labs Reviewed  CBC WITH DIFFERENTIAL/PLATELET - Abnormal; Notable for the following:    WBC 13.4 (*)    Lymphs Abs 6.2 (*)    All other components within normal limits  COMPREHENSIVE METABOLIC PANEL - Abnormal; Notable for the following:    Glucose, Bld 130 (*)    ALT 41 (*)    All other components within normal  limits  URINALYSIS, ROUTINE W REFLEX MICROSCOPIC  PATHOLOGIST SMEAR REVIEW  I-STAT BETA HCG BLOOD, ED ([facility], [facility], [facility] ONLY)    Imaging Review US Transvaginal Non-ob  09/23/2014   CLINICAL DATA:  Right lower quadrant pain and nausea for 3 days. Evaluate for ovarian cyst, rule out torsion  EXAM: TRANSABDOMINAL AND TRANSVAGINAL ULTRASOUND OF PELVIS  DOPPLER ULTRASOUND OF OVARIES  TECHNIQUE: Both transabdominal and transvaginal ultrasound examinations of the pelvis were performed. Transabdominal technique was performed for global imaging of the pelvis including uterus, ovaries, adnexal regions, and pelvic cul-de-sac.  It was necessary to proceed with endovaginal exam following the transabdominal exam to visualize the uterus, adnexa and ovaries. Color and duplex Doppler ultrasound was utilized to evaluate blood flow to the ovaries.  COMPARISON:  CT earlier the same day.  Pelvic ultrasound 08/20/2013  FINDINGS: Uterus  Measurements: 7.6 x 2.8 x 3.3 cm. No fibroids or other mass visualized.  Endometrium  Thickness: 4 mm.  No focal abnormality visualized.  Right ovary  Measurements: 3.8 x 2.8 x 3.2 cm. There is a 2.5 x 2.3 cm hypoechoic structure within the right ovary with lacy internal echoes and no vascularity. Normal blood flow seen to the adjacent ovarian parenchyma.  Left ovary  Measurements: 2.9 x 1.8 x 2.0 cm. Normal appearance/no adnexal mass. Normal blood flow appear  Pulsed Doppler evaluation of both ovaries demonstrates normal low-resistance arterial and venous waveforms.  Other findings  Small volume of simple free fluid.  IMPRESSION: 1. Findings consistent with 2.5 cm hemorrhagic cyst in the right ovary. 2. No ovarian torsion.   Electronically Signed   By: Jeb Levering M.D.   On: 09/23/2014 06:03   US Pelvis Complete  09/23/2014   CLINICAL DATA:  Right lower quadrant pain and nausea for 3 days. Evaluate for ovarian cyst, rule out torsion  EXAM: TRANSABDOMINAL AND TRANSVAGINAL ULTRASOUND OF PELVIS   DOPPLER ULTRASOUND OF OVARIES  TECHNIQUE: Both transabdominal and transvaginal ultrasound examinations of the pelvis were performed. Transabdominal technique was performed for global imaging of the pelvis including uterus, ovaries, adnexal regions, and pelvic cul-de-sac.  It was necessary to proceed with endovaginal exam following the transabdominal exam to visualize the uterus, adnexa and ovaries. Color and duplex Doppler ultrasound was utilized to evaluate blood flow to the ovaries.  COMPARISON:  CT earlier the same day.  Pelvic ultrasound 08/20/2013  FINDINGS: Uterus  Measurements: 7.6 x 2.8 x 3.3 cm. No fibroids or other mass visualized.  Endometrium  Thickness: 4 mm.  No focal abnormality visualized.  Right ovary  Measurements: 3.8 x 2.8 x 3.2 cm. There is a 2.5 x 2.3 cm hypoechoic structure within the right ovary with lacy internal echoes and no vascularity. Normal blood flow seen to the adjacent ovarian parenchyma.  Left ovary  Measurements: 2.9 x 1.8 x 2.0 cm. Normal appearance/no adnexal mass. Normal blood flow appear  Pulsed  Doppler evaluation of both ovaries demonstrates normal low-resistance arterial and venous waveforms.  Other findings  Small volume of simple free fluid.  IMPRESSION: 1. Findings consistent with 2.5 cm hemorrhagic cyst in the right ovary. 2. No ovarian torsion.   Electronically Signed   By: Jeb Levering M.D.   On: 09/23/2014 06:03   Ct Abdomen Pelvis W Contrast  09/23/2014   CLINICAL DATA:  Right lower quadrant pain and nausea for 3 days. White cell count 13.4. Microhematuria. Negative pregnancy test.  EXAM: CT ABDOMEN AND PELVIS WITH CONTRAST  TECHNIQUE: Multidetector CT imaging of the abdomen and pelvis was performed using the standard protocol following bolus administration of intravenous contrast.  CONTRAST:  124mL OMNIPAQUE IOHEXOL 300 MG/ML  SOLN  COMPARISON:  07/03/2013  FINDINGS: Mild dependent changes in the lung bases.  The liver, spleen, gallbladder, pancreas, adrenal  glands, kidneys, abdominal aorta, inferior vena cava, and retroperitoneal lymph nodes are unremarkable. Stomach, small bowel, and colon are not abnormally distended. Stool fills the colon. No free air or free fluid in the abdomen. Abdominal wall musculature appears intact.  Pelvis: The appendix is normal. Small amount of free fluid in the pelvis is likely physiologic. Probable involuting cyst in the right ovary. Uterus is not abnormally enlarged. Bladder wall is not thickened. No pelvic mass or lymphadenopathy. No destructive bone lesions.  IMPRESSION: Normal appendix. Probable involuting cyst in the right ovary with small amount of free fluid in the pelvis, likely physiologic.   Electronically Signed   By: Lucienne Capers M.D.   On: 09/23/2014 02:53   [place] Art/ven Flow Abd Pelv Doppler  09/23/2014   CLINICAL DATA:  Right lower quadrant pain and nausea for 3 days. Evaluate for ovarian cyst, rule out torsion  EXAM: TRANSABDOMINAL AND TRANSVAGINAL ULTRASOUND OF PELVIS  DOPPLER ULTRASOUND OF OVARIES  TECHNIQUE: Both transabdominal and transvaginal ultrasound examinations of the pelvis were performed. Transabdominal technique was performed for global imaging of the pelvis including uterus, ovaries, adnexal regions, and pelvic cul-de-sac.  It was necessary to proceed with endovaginal exam following the transabdominal exam to visualize the uterus, adnexa and ovaries. Color and duplex Doppler ultrasound was utilized to evaluate blood flow to the ovaries.  COMPARISON:  CT earlier the same day.  Pelvic ultrasound 08/20/2013  FINDINGS: Uterus  Measurements: 7.6 x 2.8 x 3.3 cm. No fibroids or other mass visualized.  Endometrium  Thickness: 4 mm.  No focal abnormality visualized.  Right ovary  Measurements: 3.8 x 2.8 x 3.2 cm. There is a 2.5 x 2.3 cm hypoechoic structure within the right ovary with lacy internal echoes and no vascularity. Normal blood flow seen to the adjacent ovarian parenchyma.  Left ovary  Measurements:  2.9 x 1.8 x 2.0 cm. Normal appearance/no adnexal mass. Normal blood flow appear  Pulsed Doppler evaluation of both ovaries demonstrates normal low-resistance arterial and venous waveforms.  Other findings  Small volume of simple free fluid.  IMPRESSION: 1. Findings consistent with 2.5 cm hemorrhagic cyst in the right ovary. 2. No ovarian torsion.   Electronically Signed   By: Jeb Levering M.D.   On: 09/23/2014 06:03     EKG Interpretation None       6:46 AM@4am  CT scan resulted. No appendicitis. There is involution Rt cyst with some free fluid in the pelvis, which makes me think that she might have had a rupture. I discussed the case with the Radiologist (different than the one who read the CT), and there is no Ct evidence  of ovarian torsion or ischemia. We will start oral pain control and reassess to see if [place] is mandated for this 3 day old, evolving pain process.   @5  am Persistent pain. Will get US pelvis now.  @6 :30 US shows hemorrhagic cyst only. Results discussed. Will d.c MDM   Final diagnoses:  Right lower quadrant abdominal pain  Acute right lower quadrant pain  Hemorrhagic ovarian cyst    DDx includes: Diverticulitis Peritonitis Appendicitis Hernia Nephrolithiasis Pyelonephritis UTI/Cystitis Ovarian cyst TOA Ectopic pregnancy PID  Pt comes in with cc of right sided abd pain.  Pain x 3 days, with hx of ovarian cyst. No hx of STD, no vaginal discharge and no risk factors for it. Pt has RLQ tenderness, different than her cyst, with nausea and anorexia. CT ordered to start off, as we have concerns for possible appendicitis due to the nature of the pain.            Varney Biles, MD 09/23/14 [id number]  Varney Biles, MD 09/23/14 [place], MD 09/23/14 [phone]

## 2014-09-23 NOTE — Discharge Instructions (Signed)
ER results show hemorrhagic cyst. Please see Gyne as requested. Ultrasound results provided to you, you may take it with you to the Gynecologist.   Ovarian Cyst An ovarian cyst is a fluid-filled sac that forms on an ovary. The ovaries are small organs that produce eggs in women. Various types of cysts can form on the ovaries. Most are not cancerous. Many do not cause problems, and they often go away on their own. Some may cause symptoms and require treatment. Common types of ovarian cysts include:  Functional cysts--These cysts may occur every month during the menstrual cycle. This is normal. The cysts usually go away with the next menstrual cycle if the woman does not get pregnant. Usually, there are no symptoms with a functional cyst.  Endometrioma cysts--These cysts form from the tissue that lines the uterus. They are also called "chocolate cysts" because they become filled with blood that turns brown. This type of cyst can cause pain in the lower abdomen during intercourse and with your menstrual period.  Cystadenoma cysts--This type develops from the cells on the outside of the ovary. These cysts can get very big and cause lower abdomen pain and pain with intercourse. This type of cyst can twist on itself, cut off its blood supply, and cause severe pain. It can also easily rupture and cause a lot of pain.  Dermoid cysts--This type of cyst is sometimes found in both ovaries. These cysts may contain different kinds of body tissue, such as skin, teeth, hair, or cartilage. They usually do not cause symptoms unless they get very big.  Theca lutein cysts--These cysts occur when too much of a certain hormone (human chorionic gonadotropin) is produced and overstimulates the ovaries to produce an egg. This is most common after procedures used to assist with the conception of a baby (in vitro fertilization). CAUSES   Fertility drugs can cause a condition in which multiple large cysts are formed on the  ovaries. This is called ovarian hyperstimulation syndrome.  A condition called polycystic ovary syndrome can cause hormonal imbalances that can lead to nonfunctional ovarian cysts. SIGNS AND SYMPTOMS  Many ovarian cysts do not cause symptoms. If symptoms are present, they may include:  Pelvic pain or pressure.  Pain in the lower abdomen.  Pain during sexual intercourse.  Increasing girth (swelling) of the abdomen.  Abnormal menstrual periods.  Increasing pain with menstrual periods.  Stopping having menstrual periods without being pregnant. DIAGNOSIS  These cysts are commonly found during a routine or annual pelvic exam. Tests may be ordered to find out more about the cyst. These tests may include:  Ultrasound.  X-ray of the pelvis.  CT scan.  MRI.  Blood tests. TREATMENT  Many ovarian cysts go away on their own without treatment. Your health care provider may want to check your cyst regularly for 2-3 months to see if it changes. For women in menopause, it is particularly important to monitor a cyst closely because of the higher rate of ovarian cancer in menopausal women. When treatment is needed, it may include any of the following:  A procedure to drain the cyst (aspiration). This may be done using a long needle and ultrasound. It can also be done through a laparoscopic procedure. This involves using a thin, lighted tube with a tiny camera on the end (laparoscope) inserted through a small incision.  Surgery to remove the whole cyst. This may be done using laparoscopic surgery or an open surgery involving a larger incision in the lower  abdomen.  Hormone treatment or birth control pills. These methods are sometimes used to help dissolve a cyst. HOME CARE INSTRUCTIONS   Only take over-the-counter or prescription medicines as directed by your health care provider.  Follow up with your health care provider as directed.  Get regular pelvic exams and Pap tests. SEEK MEDICAL  CARE IF:   Your periods are late, irregular, or painful, or they stop.  Your pelvic pain or abdominal pain does not go away.  Your abdomen becomes larger or swollen.  You have pressure on your bladder or trouble emptying your bladder completely.  You have pain during sexual intercourse.  You have feelings of fullness, pressure, or discomfort in your stomach.  You lose weight for no apparent reason.  You feel generally ill.  You become constipated.  You lose your appetite.  You develop acne.  You have an increase in body and facial hair.  You are gaining weight, without changing your exercise and eating habits.  You think you are pregnant. SEEK IMMEDIATE MEDICAL CARE IF:   You have increasing abdominal pain.  You feel sick to your stomach (nauseous), and you throw up (vomit).  You develop a fever that comes on suddenly.  You have abdominal pain during a bowel movement.  Your menstrual periods become heavier than usual. MAKE SURE YOU:  Understand these instructions.  Will watch your condition.  Will get help right away if you are not doing well or get worse. Document Released: 07/19/2005 Document Revised: 07/24/2013 Document Reviewed: 03/26/2013 [facility] Patient Information 2015 [place], [place]. This information is not intended to replace advice given to you by your health care provider. Make sure you discuss any questions you have with your health care provider.

## 2014-09-27 ENCOUNTER — Encounter: Payer: Self-pay | Admitting: Women's Health

## 2014-09-27 ENCOUNTER — Ambulatory Visit (INDEPENDENT_AMBULATORY_CARE_PROVIDER_SITE_OTHER): Payer: BLUE CROSS/BLUE SHIELD | Admitting: Women's Health

## 2014-09-27 VITALS — BP 128/76 | Ht 69.0 in | Wt 231.0 lb

## 2014-09-27 DIAGNOSIS — B009 Herpesviral infection, unspecified: Secondary | ICD-10-CM

## 2014-09-27 DIAGNOSIS — Z01419 Encounter for gynecological examination (general) (routine) without abnormal findings: Secondary | ICD-10-CM

## 2014-09-27 DIAGNOSIS — R635 Abnormal weight gain: Secondary | ICD-10-CM

## 2014-09-27 DIAGNOSIS — N8320 Unspecified ovarian cysts: Secondary | ICD-10-CM

## 2014-09-27 DIAGNOSIS — N83201 Unspecified ovarian cyst, right side: Secondary | ICD-10-CM

## 2014-09-27 DIAGNOSIS — Z30011 Encounter for initial prescription of contraceptive pills: Secondary | ICD-10-CM

## 2014-09-27 LAB — TSH: TSH: 3.353 u[IU]/mL (ref 0.350–4.500)

## 2014-09-27 MED ORDER — LEVONORGESTREL-ETHINYL ESTRAD 0.1-20 MG-MCG PO TABS: 1.0000 | ORAL_TABLET | Freq: Every day | ORAL | Status: DC

## 2014-09-27 MED ORDER — VALACYCLOVIR HCL 500 MG PO TABS: ORAL_TABLET | ORAL | Status: DC

## 2014-09-27 NOTE — Progress Notes (Signed)
Karyssa Amaral 1996-01-31 [id number]    History:    Presents for annual exam.  Presents for follow-up from the ER visit 09/22/2014 having extreme lower right quadrant pain, was noted to have a right ovarian hemorrhagic cyst 2.5 cm. Negative CT scan 09/22/2014. Reports pain persists but is now tolerable. Regular monthly cycle/condoms. History of ovarian cysts, 11/2011 had a benign 10 cm cyst removed. First partner both.  Past medical history, past surgical history, family history and social history were all reviewed and documented in the EPIC chart. Student at [employer] and works at [employer].  ROS:  A ROS was performed and pertinent positives and negatives are included.  Exam:  Filed Vitals:   09/27/14 1502  BP: 128/76    General appearance:  Normal Thyroid:  Symmetrical, normal in size, without palpable masses or nodularity. Respiratory  Auscultation:  Clear without wheezing or rhonchi Cardiovascular  Auscultation:  Regular rate, without rubs, murmurs or gallops  Edema/varicosities:  Not grossly evident Abdominal  Soft,nontender, without masses, guarding or rebound.  Liver/spleen:  No organomegaly noted  Hernia:  None appreciated  Skin  Inspection:  Grossly normal right upper cheek healing superficial erythematous rash   Breasts: Examined lying and sitting.     Right: Without masses, retractions, discharge or axillary adenopathy.     Left: Without masses, retractions, discharge or axillary adenopathy. Gentitourinary   Inguinal/mons:  Normal without inguinal adenopathy  External genitalia:  Normal  BUS/Urethra/Skene's glands:  Normal  Vagina:  Normal  Cervix:  Normal  Uterus:   normal in size, shape and contour.  Midline and mobile  Adnexa/parametria:     Rt: Without masses or tenderness.   Lt: Without masses or tenderness.  Anus and perineum: Normal   Assessment/Plan:  19 y.o. MWF G0 for annual exam.    Right ovarian cyst Monthly cycle/condoms Recurrent facial rash with  blistering appearance  Plan: Contraception and prevention of ovarian cysts reviewed. Will try Alesse, prescription, risks of blood clots and strokes reviewed, has used in the past. proper use given and reviewed start up instructions reviewed encouraged condoms first month. Repeat ultrasound in 3 months to check for resolution. SBE's, exercise, calcium rich diet, MVI daily encouraged. Reports difficulty losing weight. Has had problems with recurrent blistering area on upper right cheek since childhood has seen a dermatologist without resolution. In the healing stage right now will check a HSV IgG. TSH, glucose, Pap. New screening guidelines reviewed. Declines gardasil.    Huel Cote [facility], 5:34 PM 09/27/2014

## 2014-09-27 NOTE — Patient Instructions (Signed)

## 2014-09-28 LAB — GLUCOSE, RANDOM: GLUCOSE: 88 mg/dL (ref 70–99)

## 2014-09-30 LAB — HSV(HERPES SIMPLEX VRS) I + II AB-IGG: HSV 1 Glycoprotein G Ab, IgG: 11.59 IV — ABNORMAL HIGH

## 2014-11-22 ENCOUNTER — Ambulatory Visit (INDEPENDENT_AMBULATORY_CARE_PROVIDER_SITE_OTHER): Payer: BLUE CROSS/BLUE SHIELD

## 2014-11-22 ENCOUNTER — Ambulatory Visit (INDEPENDENT_AMBULATORY_CARE_PROVIDER_SITE_OTHER): Payer: BLUE CROSS/BLUE SHIELD | Admitting: Women's Health

## 2014-11-22 VITALS — BP 160/80 | Ht 69.0 in | Wt 231.0 lb

## 2014-11-22 DIAGNOSIS — R1031 Right lower quadrant pain: Secondary | ICD-10-CM | POA: Diagnosis not present

## 2014-11-22 MED ORDER — IBUPROFEN 600 MG PO TABS: 600.0000 mg | ORAL_TABLET | Freq: Four times a day (QID) | ORAL | Status: DC | PRN

## 2014-11-22 NOTE — Progress Notes (Signed)
Patient ID: Chelsea Day, female   DOB: 1996-03-28, 19 y.o.   MRN: [id number] Presents with complaint of severe, sharp and burning RLQ pain and nausea/vomiting 1x since this morning. Pain began about 2 weeks ago off and on, but has worsened and become more constant over the past 2 days. Pain 8/10, localized to right side and does not radiate. Pain best with standing, worst with lying. Tried ibuprofen and Zofran this morning, little to no relief. Denies discharge, urinary symptoms, diarrhea, constipation, or fever. Married, OCs and condoms regularly, LMP in February, all home pregnancy tests negative. History of 10 cm cyst April 2013, additional cyst May 2014, still has appendix.   Exam: Visibly uncomfortable and tearful, abdomen soft, no guarding, rebound test negative, McBurney's point negative. Ultrasound: No uterine abnormalities seen. Ovaries appear normal with approximately 15 follicles less than 10 mm on each ovary. Good blood flow seen to right ovary. Small amount of free fluid seen in cul-de-sac. Transvaginal images. Moderate amount of stool noted in colon.  Minimal free fluid noted in cul-de-sac, no visible cysts Constipation  Plan: Encouraged to rest and Ibuprofen 600 mg prescription given. Instructed to take MiraLAX until bowel movements soft, increase fluids. Continue OCs. Remains uncomfortable but better than earlier.

## 2014-12-25 ENCOUNTER — Ambulatory Visit: Payer: BLUE CROSS/BLUE SHIELD | Admitting: Women's Health

## 2014-12-25 ENCOUNTER — Other Ambulatory Visit: Payer: BLUE CROSS/BLUE SHIELD

## 2015-01-17 ENCOUNTER — Other Ambulatory Visit: Payer: Self-pay | Admitting: Obstetrics and Gynecology

## 2015-01-17 ENCOUNTER — Encounter (HOSPITAL_COMMUNITY): Payer: Self-pay | Admitting: *Deleted

## 2015-01-19 IMAGING — CT CT ABD-PELV W/ CM
1 of 2 series · 15 of 32 positions shown, 19 images · IV contrast (OMNIPAQUE)
Comparison: CT abdomen and pelvis 11/03/2011.  Pelvic ultrasound
09/13/2012.

CLINICAL DATA: Right lower quadrant abdominal pain.  Nausea and
vomiting.  Surgery for right ovarian cyst November 2011.

CT ABDOMEN AND PELVIS WITH CONTRAST
TECHNIQUE: Multidetector CT imaging of the abdomen and pelvis was
performed following the standard protocol during bolus
administration of intravenous contrast.
Contrast: 100mL OMNIPAQUE IOHEXOL 300 MG/ML  SOLN

[Series 2: routine abdomen/pelvis with · axial · 0.70mm/px · z∈[-489,-94]mm · 15 of 87 slices shown, 19 images]
[im 4/87  soft-tissue]
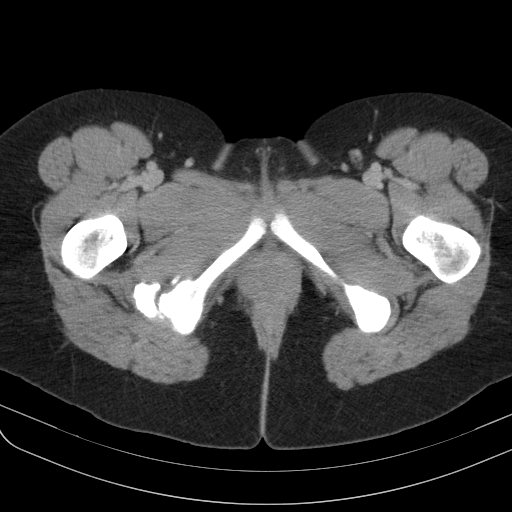
[im 4/87  bone]
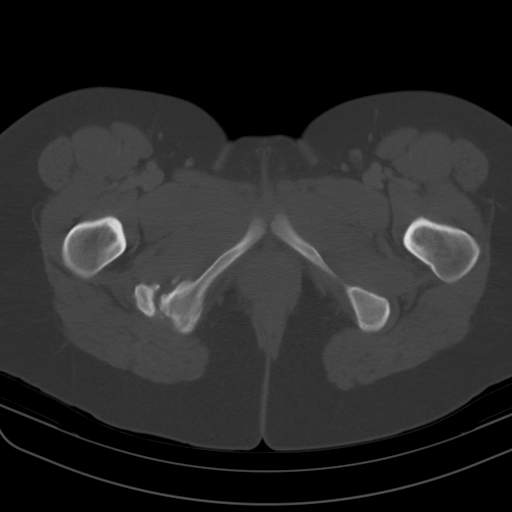
[im 11/87  soft-tissue]
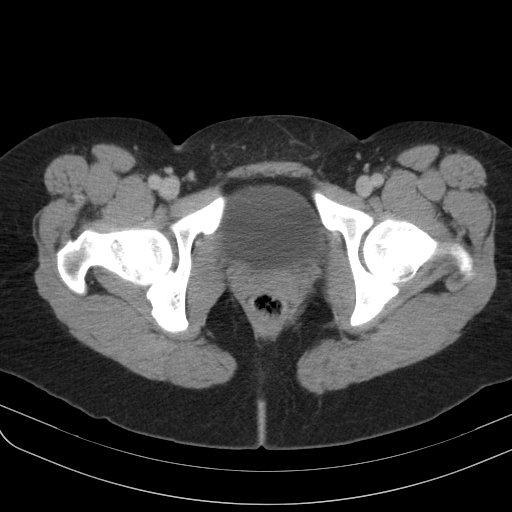
[im 18/87  soft-tissue]
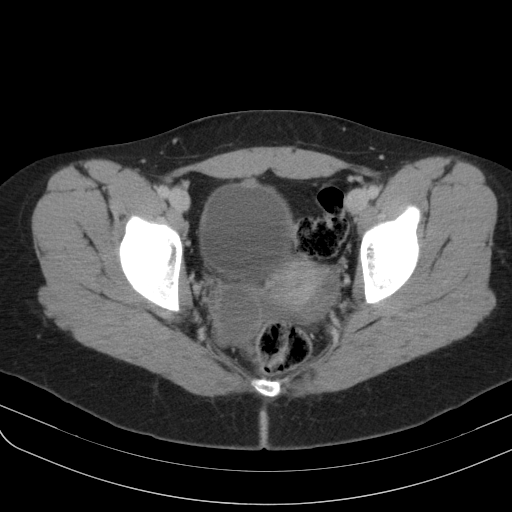
[im 25/87  soft-tissue]
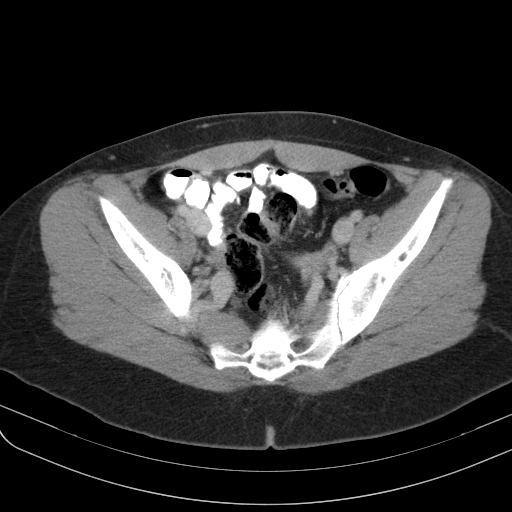
[im 31/87  soft-tissue]
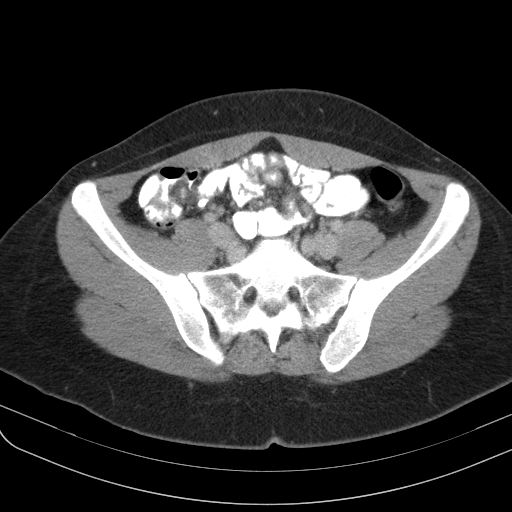
[im 38/87  soft-tissue]
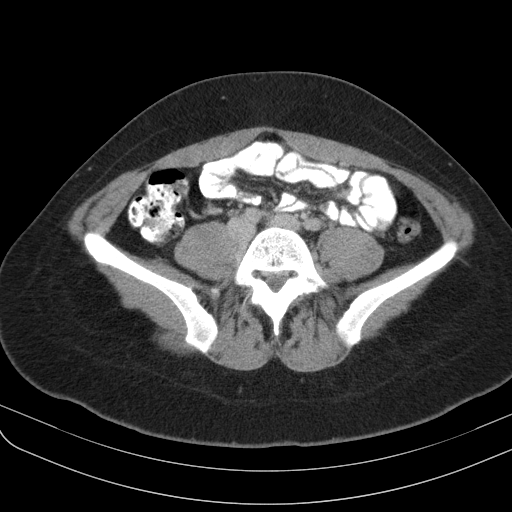
[im 45/87  soft-tissue]
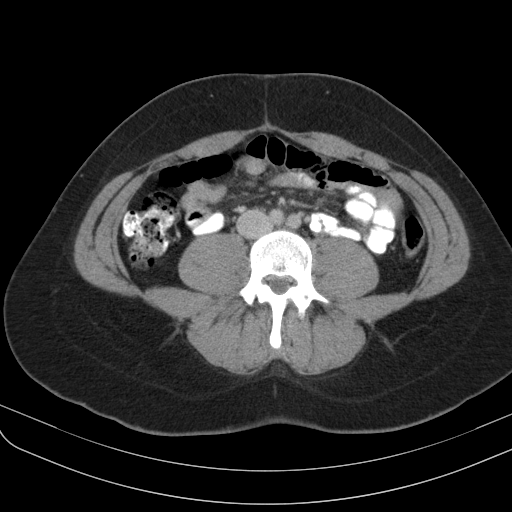
[im 49/87  soft-tissue]
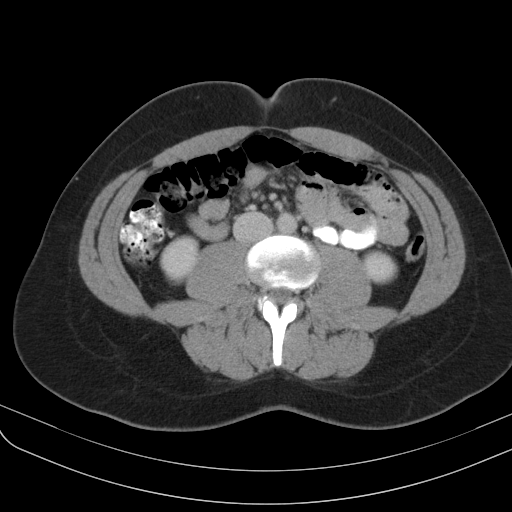
[im 56/87  soft-tissue]
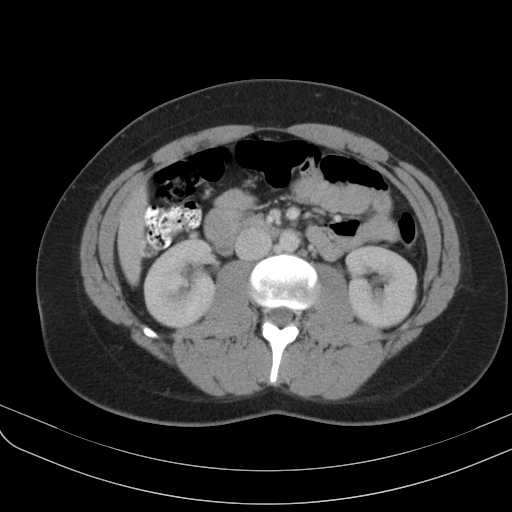
[im 56/87  bone]
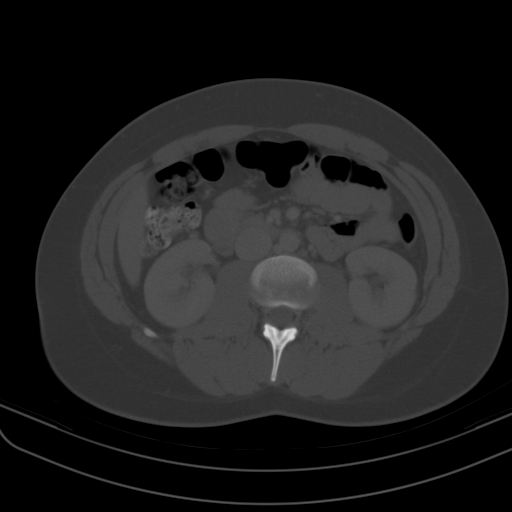
[im 62/87  soft-tissue]
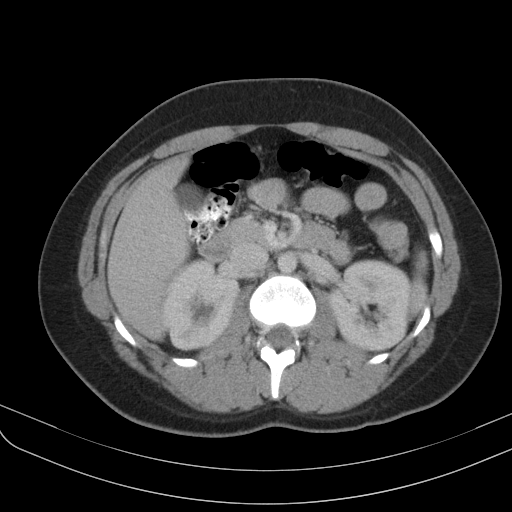
[im 69/87  soft-tissue]
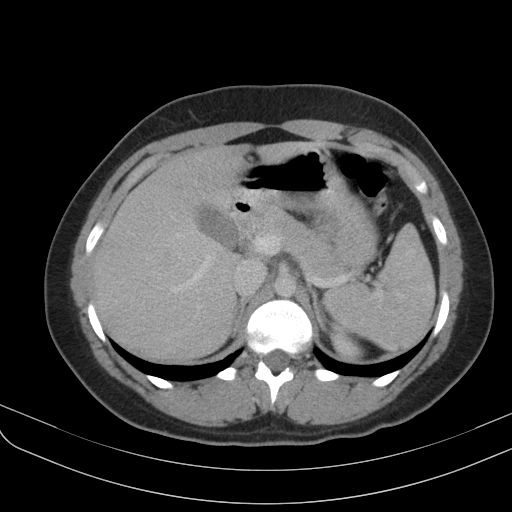
[im 73/87  lung]
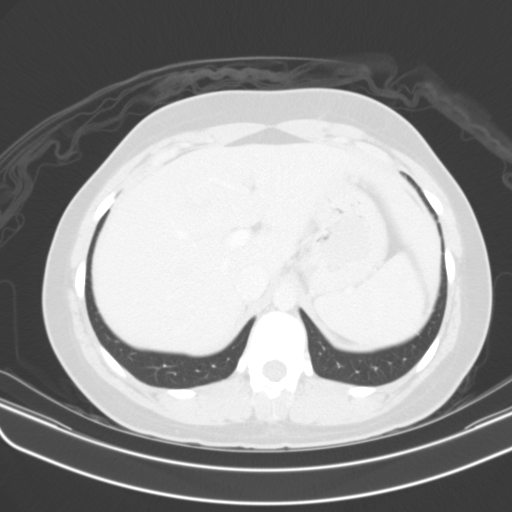
[im 76/87  soft-tissue]
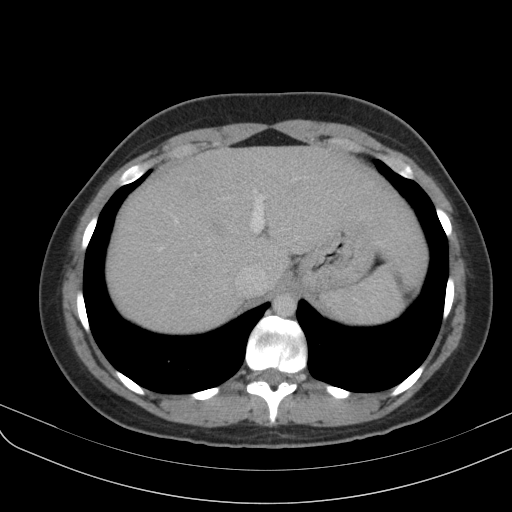
[im 76/87  lung]
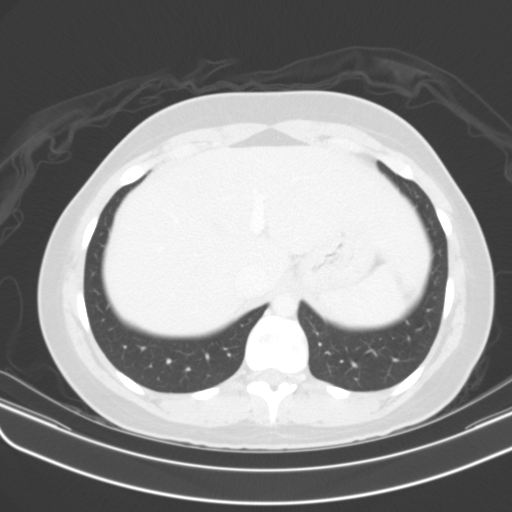
[im 80/87  lung]
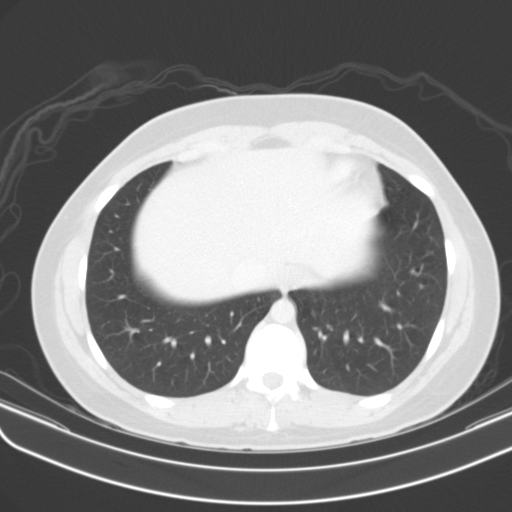
[im 83/87  soft-tissue]
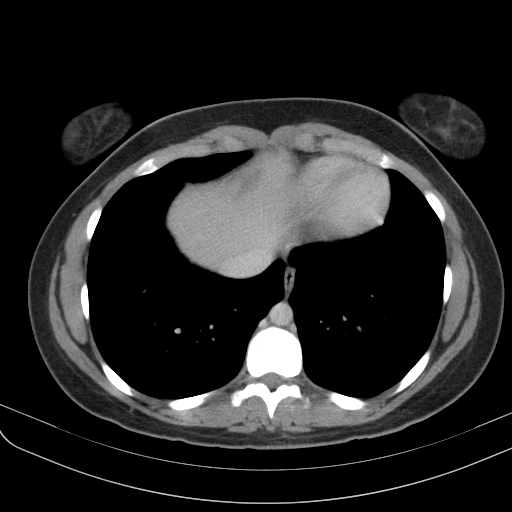
[im 83/87  lung]
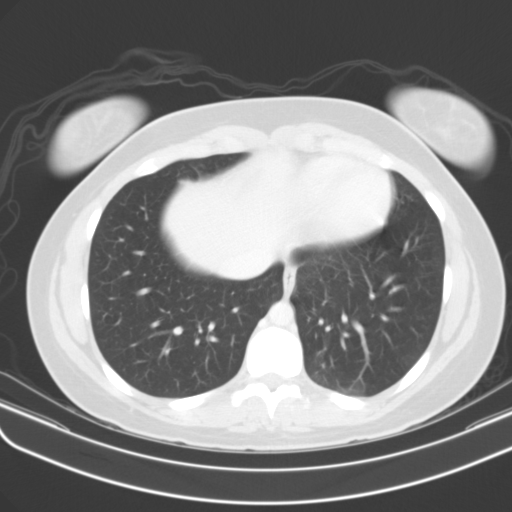

[15 of 32 positions shown; findings below may reference images not displayed]

FINDINGS: Lungs are clear without focal nodule, mass, or airspace
disease.  The heart size is normal.  No significant extra-axial
fluid collection is present.

The liver and spleen are within normal limits.  The stomach,
duodenum, and pancreas are unremarkable.  The common bile duct and
gallbladder are within normal limits.  The adrenal glands are
normal bilaterally.  The kidneys and ureters are unremarkable.

The rectosigmoid colon is within normal limits.  Remainder of the
colon is unremarkable.  The appendix is not discretely visualized
and may be surgically absent.  No secondary inflammatory changes
are present.  The small bowel is unremarkable.  No significant
adenopathy is present.

Uterus and left adnexa are within normal limits.  A cystic lesion
of the right adnexa measures 4.5 x 3.2 x 3.2 cm.  A small amount of
free fluid is present.

Bone windows are unremarkable.
IMPRESSION: 1.  4.5 cm cyst of the right ovary as on ultrasound.
2.  The appendix is not discretely visualized but no secondary
signs inflammation are evident.
3.  A small amount of free fluid may be associated with the right
ovarian cyst.  This could be physiologic.

## 2015-01-23 NOTE — H&P (Signed)
Admission History and Physical Exam for a Gynecology Patient  [name] is a 19 y.o. female, G0P0, who presents for a diagnostic laparoscopy and possible right oophorectomy. She has been followed at the [facility] of [facility]. The patient has a history of right lower quadrant pain and recurrent ovarian cyst.  She has had a laparoscopy in the past were a 10 cm simple cyst was removed.  The patient continues to have severe pain on that side.  She has been told in the past that she has endometriosis.  However, at the time of her prior laparoscopy no endometriosis was seen.  OB History    Gravida Para Term Preterm AB TAB SAB Ectopic Multiple Living   0               Past Medical History  Diagnosis Date  . Bilateral ovarian cysts   . Irregular periods/menstrual cycles     No prescriptions prior to admission    Past Surgical History  Procedure Laterality Date  . Laparoscopy  11/08/2011    Procedure: LAPAROSCOPY OPERATIVE;  Surgeon: Olga Millers, MD;  Location: [facility];  Service: Gynecology;  Laterality: Right;  . Ovarian cyst removal  11/08/2011    Procedure: OVARIAN CYSTECTOMY;  Surgeon: Olga Millers, MD;  Location: [facility];  Service: Gynecology;  Laterality: Right;    Allergies  Allergen Reactions  . Peanuts [Peanut Oil] Anaphylaxis and Hives    Allergic to all nuts  . Latex Rash    Family History: family history includes Cancer in her father.  Social History:  reports that she quit smoking about 13 months ago. Her smoking use included Cigarettes. She has never used smokeless tobacco. She reports that she drinks alcohol. She reports that she does not use illicit drugs.  Review of systems: See HPI.  Admission Physical Exam:    Body mass index is 34.15 kg/(m^2).  Height 5\' 10"  (1.778 m), weight 238 lb (107.956 kg), last menstrual period 12/24/2014.  HEENT:                 Within normal limits Chest:                    Clear Heart:                    Regular rate and rhythm Breasts:                No masses, skin changes, bleeding, or discharge present Abdomen:             Nontender, no masses Extremities:          Grossly normal Neurologic exam: Grossly normal  Pelvic exam:  External genitalia: normal general appearance Vaginal: normal without tenderness, induration or masses Cervix: normal appearance Adnexa: normal bimanual exam and tenderness on the right Uterus: normal size shape and consistency  Assessment:  Right lower quadrant pain  Questionable history of endometriosis  History of right ovarian cysts  Plan:  The patient will undergo a diagnostic laparoscopy.  If endometriosis, pelvic adhesions, or other pathology is seen on the right ovary, then we will proceed with a right oophorectomy.  However, if no pathology is seen, then we will not remove the right ovary.  The patient understands the indications for surgical procedure.  She accepts the risk of, but not limited to, anesthetic complications, bleeding, infections, and possible damage to surrounding organs.  She understands that no guarantees can be given concerning the total relief of her discomfort.   Chelsea Day 01/23/2015

## 2015-01-24 ENCOUNTER — Ambulatory Visit (HOSPITAL_COMMUNITY)
Admission: RE | Admit: 2015-01-24 | Discharge: 2015-01-24 | Disposition: A | Discharge status: Home / Self Care | Payer: BLUE CROSS/BLUE SHIELD | Source: Ambulatory Visit | Attending: Obstetrics and Gynecology | Admitting: Obstetrics and Gynecology

## 2015-01-24 ENCOUNTER — Ambulatory Visit (HOSPITAL_COMMUNITY): Payer: BLUE CROSS/BLUE SHIELD | Admitting: Anesthesiology

## 2015-01-24 ENCOUNTER — Encounter (HOSPITAL_COMMUNITY): Payer: Self-pay | Admitting: Anesthesiology

## 2015-01-24 ENCOUNTER — Encounter (HOSPITAL_COMMUNITY)
Admission: RE | Disposition: A | Discharge status: Home / Self Care | Payer: Self-pay | Source: Ambulatory Visit | Attending: Obstetrics and Gynecology

## 2015-01-24 DIAGNOSIS — N736 Female pelvic peritoneal adhesions (postinfective): Secondary | ICD-10-CM | POA: Insufficient documentation

## 2015-01-24 DIAGNOSIS — Z91010 Allergy to peanuts: Secondary | ICD-10-CM | POA: Diagnosis not present

## 2015-01-24 DIAGNOSIS — E669 Obesity, unspecified: Secondary | ICD-10-CM | POA: Insufficient documentation

## 2015-01-24 DIAGNOSIS — N803 Endometriosis of pelvic peritoneum: Secondary | ICD-10-CM | POA: Diagnosis not present

## 2015-01-24 DIAGNOSIS — Z302 Encounter for sterilization: Secondary | ICD-10-CM | POA: Insufficient documentation

## 2015-01-24 DIAGNOSIS — N830 Follicular cyst of ovary: Secondary | ICD-10-CM | POA: Insufficient documentation

## 2015-01-24 DIAGNOSIS — Z91040 Latex allergy status: Secondary | ICD-10-CM | POA: Insufficient documentation

## 2015-01-24 DIAGNOSIS — Z87891 Personal history of nicotine dependence: Secondary | ICD-10-CM | POA: Diagnosis not present

## 2015-01-24 DIAGNOSIS — Z6834 Body mass index (BMI) 34.0-34.9, adult: Secondary | ICD-10-CM | POA: Diagnosis not present

## 2015-01-24 DIAGNOSIS — R1031 Right lower quadrant pain: Secondary | ICD-10-CM | POA: Diagnosis present

## 2015-01-24 HISTORY — PX: LAPAROSCOPIC OVARIAN CYSTECTOMY: SHX6248

## 2015-01-24 HISTORY — PX: UNILATERAL SALPINGECTOMY: SHX6160

## 2015-01-24 LAB — CBC
HCT: 40.4 % (ref 36.0–46.0)
HEMOGLOBIN: 13.4 g/dL (ref 12.0–15.0)
MCH: 27.4 pg (ref 26.0–34.0)
MCHC: 33.2 g/dL (ref 30.0–36.0)
MCV: 82.6 fL (ref 78.0–100.0)
PLATELETS: 332 10*3/uL (ref 150–400)
RBC: 4.89 MIL/uL (ref 3.87–5.11)
RDW: 14.3 % (ref 11.5–15.5)
WBC: 10.5 10*3/uL (ref 4.0–10.5)

## 2015-01-24 LAB — PREGNANCY, URINE: Preg Test, Ur: NEGATIVE

## 2015-01-24 SURGERY — EXCISION, CYST, OVARY, LAPAROSCOPIC
Anesthesia: General | Site: Abdomen | Laterality: Right

## 2015-01-24 MED ORDER — SCOPOLAMINE 1 MG/3DAYS TD PT72
1.0000 | MEDICATED_PATCH | Freq: Once | TRANSDERMAL | Status: DC
  Administered 2015-01-24: 1.5 mg via TRANSDERMAL

## 2015-01-24 MED ORDER — HYDROMORPHONE HCL 1 MG/ML IJ SOLN
INTRAMUSCULAR | Status: AC
  Filled 2015-01-24: qty 1

## 2015-01-24 MED ORDER — OXYCODONE-ACETAMINOPHEN 5-325 MG PO TABS
1.0000 | ORAL_TABLET | ORAL | Status: DC | PRN
Start: 2015-01-24 — End: 2015-01-24
  Administered 2015-01-24: 1 via ORAL

## 2015-01-24 MED ORDER — ONDANSETRON HCL 4 MG/2ML IJ SOLN
INTRAMUSCULAR | Status: DC
Start: 2015-01-24 — End: 2015-01-24
  Filled 2015-01-24: qty 2

## 2015-01-24 MED ORDER — PROPOFOL 10 MG/ML IV BOLUS
INTRAVENOUS | Status: DC | PRN
  Administered 2015-01-24: 200 mg via INTRAVENOUS

## 2015-01-24 MED ORDER — FENTANYL CITRATE (PF) 100 MCG/2ML IJ SOLN
INTRAMUSCULAR | Status: DC | PRN
  Administered 2015-01-24 (×5): 50 ug via INTRAVENOUS
  Administered 2015-01-24: 100 ug via INTRAVENOUS

## 2015-01-24 MED ORDER — ONDANSETRON HCL 4 MG/2ML IJ SOLN
INTRAMUSCULAR | Status: DC | PRN
  Administered 2015-01-24: 4 mg via INTRAVENOUS

## 2015-01-24 MED ORDER — GLYCOPYRROLATE 0.2 MG/ML IJ SOLN
INTRAMUSCULAR | Status: DC | PRN
  Administered 2015-01-24: 0.6 mg via INTRAVENOUS

## 2015-01-24 MED ORDER — DEXAMETHASONE SODIUM PHOSPHATE 10 MG/ML IJ SOLN
INTRAMUSCULAR | Status: AC
  Filled 2015-01-24: qty 1

## 2015-01-24 MED ORDER — OXYCODONE-ACETAMINOPHEN 5-325 MG PO TABS
ORAL_TABLET | ORAL | Status: AC
  Filled 2015-01-24: qty 1

## 2015-01-24 MED ORDER — NEOSTIGMINE METHYLSULFATE 10 MG/10ML IV SOLN
INTRAVENOUS | Status: AC
  Filled 2015-01-24: qty 1

## 2015-01-24 MED ORDER — FENTANYL CITRATE (PF) 100 MCG/2ML IJ SOLN: 25.0000 ug | INTRAMUSCULAR | Status: DC | PRN

## 2015-01-24 MED ORDER — PROPOFOL 10 MG/ML IV BOLUS
INTRAVENOUS | Status: AC
Start: 2015-01-24 — End: 2015-01-24
  Filled 2015-01-24: qty 20

## 2015-01-24 MED ORDER — NEOSTIGMINE METHYLSULFATE 10 MG/10ML IV SOLN
INTRAVENOUS | Status: DC | PRN
  Administered 2015-01-24: 3 mg via INTRAVENOUS

## 2015-01-24 MED ORDER — FENTANYL CITRATE (PF) 100 MCG/2ML IJ SOLN
INTRAMUSCULAR | Status: AC
  Filled 2015-01-24: qty 2

## 2015-01-24 MED ORDER — MEPERIDINE HCL 25 MG/ML IJ SOLN: 6.2500 mg | INTRAMUSCULAR | Status: DC | PRN

## 2015-01-24 MED ORDER — LIDOCAINE HCL (CARDIAC) 20 MG/ML IV SOLN
INTRAVENOUS | Status: DC | PRN
Start: 2015-01-24 — End: 2015-01-24
  Administered 2015-01-24: 100 mg via INTRAVENOUS

## 2015-01-24 MED ORDER — MIDAZOLAM HCL 2 MG/2ML IJ SOLN
INTRAMUSCULAR | Status: AC
  Filled 2015-01-24: qty 2

## 2015-01-24 MED ORDER — DEXAMETHASONE SODIUM PHOSPHATE 10 MG/ML IJ SOLN
INTRAMUSCULAR | Status: DC | PRN
  Administered 2015-01-24: 8 mg via INTRAVENOUS

## 2015-01-24 MED ORDER — GLYCOPYRROLATE 0.2 MG/ML IJ SOLN
INTRAMUSCULAR | Status: AC
  Filled 2015-01-24: qty 2

## 2015-01-24 MED ORDER — MIDAZOLAM HCL 2 MG/2ML IJ SOLN
INTRAMUSCULAR | Status: DC | PRN
  Administered 2015-01-24: 2 mg via INTRAVENOUS

## 2015-01-24 MED ORDER — KETOROLAC TROMETHAMINE 30 MG/ML IJ SOLN: 30.0000 mg | Freq: Once | INTRAMUSCULAR | Status: DC | PRN

## 2015-01-24 MED ORDER — LIDOCAINE HCL (CARDIAC) 20 MG/ML IV SOLN
INTRAVENOUS | Status: AC
Start: 2015-01-24 — End: 2015-01-24
  Filled 2015-01-24: qty 5

## 2015-01-24 MED ORDER — OXYCODONE-ACETAMINOPHEN 5-325 MG PO TABS: 1.0000 | ORAL_TABLET | ORAL | Status: DC | PRN

## 2015-01-24 MED ORDER — ROCURONIUM BROMIDE 100 MG/10ML IV SOLN
INTRAVENOUS | Status: AC
Start: 2015-01-24 — End: 2015-01-24
  Filled 2015-01-24: qty 1

## 2015-01-24 MED ORDER — ROCURONIUM BROMIDE 100 MG/10ML IV SOLN
INTRAVENOUS | Status: DC | PRN
  Administered 2015-01-24: 10 mg via INTRAVENOUS
  Administered 2015-01-24: 40 mg via INTRAVENOUS

## 2015-01-24 MED ORDER — BUPIVACAINE-EPINEPHRINE (PF) 0.5% -1:200000 IJ SOLN
INTRAMUSCULAR | Status: AC
  Filled 2015-01-24: qty 30

## 2015-01-24 MED ORDER — IBUPROFEN 800 MG PO TABS
800.0000 mg | ORAL_TABLET | Freq: Three times a day (TID) | ORAL | Status: DC | PRN
Start: 2015-01-24 — End: 2016-02-04

## 2015-01-24 MED ORDER — BUPIVACAINE-EPINEPHRINE 0.5% -1:200000 IJ SOLN
INTRAMUSCULAR | Status: DC | PRN
  Administered 2015-01-24: 30 mL

## 2015-01-24 MED ORDER — ONDANSETRON HCL 4 MG/2ML IJ SOLN
INTRAMUSCULAR | Status: AC
  Filled 2015-01-24: qty 2

## 2015-01-24 MED ORDER — LACTATED RINGERS IV SOLN
INTRAVENOUS | Status: DC
  Administered 2015-01-24 (×2): via INTRAVENOUS

## 2015-01-24 MED ORDER — HYDROMORPHONE HCL 1 MG/ML IJ SOLN
INTRAMUSCULAR | Status: DC | PRN
  Administered 2015-01-24: 1 mg via INTRAVENOUS

## 2015-01-24 MED ORDER — KETOROLAC TROMETHAMINE 30 MG/ML IJ SOLN
INTRAMUSCULAR | Status: DC | PRN
  Administered 2015-01-24: 30 mg via INTRAVENOUS

## 2015-01-24 MED ORDER — HEPARIN SODIUM (PORCINE) 5000 UNIT/ML IJ SOLN
INTRAMUSCULAR | Status: AC
  Filled 2015-01-24: qty 1

## 2015-01-24 MED ORDER — ONDANSETRON HCL 4 MG/2ML IJ SOLN
4.0000 mg | Freq: Once | INTRAMUSCULAR | Status: AC | PRN
  Administered 2015-01-24: 4 mg via INTRAVENOUS

## 2015-01-24 MED ORDER — FENTANYL CITRATE (PF) 250 MCG/5ML IJ SOLN
INTRAMUSCULAR | Status: AC
  Filled 2015-01-24: qty 5

## 2015-01-24 MED ORDER — ONDANSETRON 8 MG PO TBDP: 8.0000 mg | ORAL_TABLET | Freq: Three times a day (TID) | ORAL | Status: DC | PRN

## 2015-01-24 MED ORDER — SCOPOLAMINE 1 MG/3DAYS TD PT72
MEDICATED_PATCH | TRANSDERMAL | Status: AC
  Administered 2015-01-24: 1.5 mg via TRANSDERMAL
  Filled 2015-01-24: qty 1

## 2015-01-24 MED ORDER — KETOROLAC TROMETHAMINE 30 MG/ML IJ SOLN
INTRAMUSCULAR | Status: AC
Start: 2015-01-24 — End: 2015-01-24
  Filled 2015-01-24: qty 1

## 2015-01-24 MED ORDER — EPHEDRINE SULFATE 50 MG/ML IJ SOLN
INTRAMUSCULAR | Status: DC | PRN
  Administered 2015-01-24 (×2): 10 mg via INTRAVENOUS

## 2015-01-24 SURGICAL SUPPLY — 32 items
CABLE HIGH FREQUENCY MONO STRZ (ELECTRODE) ×4 IMPLANT
CHLORAPREP W/TINT 26ML (MISCELLANEOUS) ×4 IMPLANT
CLOSURE WOUND 1/4 X3 (GAUZE/BANDAGES/DRESSINGS) ×1
CLOTH BEACON ORANGE TIMEOUT ST (SAFETY) ×4 IMPLANT
DRSG COVADERM PLUS 2X2 (GAUZE/BANDAGES/DRESSINGS) ×8 IMPLANT
DRSG OPSITE POSTOP 3X4 (GAUZE/BANDAGES/DRESSINGS) ×4 IMPLANT
FORCEPS CUTTING 33CM 5MM (CUTTING FORCEPS) IMPLANT
FORCEPS CUTTING 45CM 5MM (CUTTING FORCEPS) IMPLANT
GLOVE BIOGEL PI IND STRL 8.5 (GLOVE) ×2 IMPLANT
GLOVE BIOGEL PI INDICATOR 8.5 (GLOVE) ×2
GLOVE ECLIPSE 8.0 STRL XLNG CF (GLOVE) ×8 IMPLANT
GOWN STRL REUS W/TWL 2XL LVL3 (GOWN DISPOSABLE) ×4 IMPLANT
GOWN STRL REUS W/TWL LRG LVL3 (GOWN DISPOSABLE) ×8 IMPLANT
LIQUID BAND (GAUZE/BANDAGES/DRESSINGS) ×4 IMPLANT
NS IRRIG 1000ML POUR BTL (IV SOLUTION) ×4 IMPLANT
PACK LAPAROSCOPY BASIN (CUSTOM PROCEDURE TRAY) ×4 IMPLANT
PAD POSITIONER PINK NONSTERILE (MISCELLANEOUS) ×4 IMPLANT
POUCH SPECIMEN RETRIEVAL 10MM (ENDOMECHANICALS) IMPLANT
PROTECTOR NERVE ULNAR (MISCELLANEOUS) ×4 IMPLANT
RINGERS IRRIG 1000ML POUR BTL (IV SOLUTION) IMPLANT
SET IRRIG TUBING LAPAROSCOPIC (IRRIGATION / IRRIGATOR) IMPLANT
SHEARS HARMONIC ACE PLUS 36CM (ENDOMECHANICALS) IMPLANT
STRIP CLOSURE SKIN 1/4X3 (GAUZE/BANDAGES/DRESSINGS) ×3 IMPLANT
SUT MNCRL AB 3-0 PS2 27 (SUTURE) ×4 IMPLANT
SUT VIC AB 2-0 UR6 27 (SUTURE) ×8 IMPLANT
SUT VICRYL 0 ENDOLOOP (SUTURE) ×8 IMPLANT
SYR 50ML LL SCALE MARK (SYRINGE) IMPLANT
TOWEL OR 17X24 6PK STRL BLUE (TOWEL DISPOSABLE) ×8 IMPLANT
TRAY FOLEY CATH SILVER 14FR (SET/KITS/TRAYS/PACK) ×4 IMPLANT
TROCAR BALLN 12MMX100 BLUNT (TROCAR) ×4 IMPLANT
WARMER LAPAROSCOPE (MISCELLANEOUS) ×4 IMPLANT
WATER STERILE IRR 1000ML POUR (IV SOLUTION) ×4 IMPLANT

## 2015-01-24 NOTE — Op Note (Addendum)
OPERATIVE NOTE  Chelsea Day  DOB:    [birth year]  MRN:    [id number]  CSN:    [id number]  Date of Surgery:  01/24/2015  Preoperative Diagnosis:  Right lower quadrant pain  History of endometriosis  History of ovarian cyst  Postoperative Diagnosis:  Same  Pelvic adhesions  Procedure:  Diagnostic laparoscopy  Laparoscopic lysis of adhesions  Laparoscopic right salpingo-oophorectomy  Laparoscopic pelvic biopsies  Surgeon:  Gildardo Cranker, M.D.  Assistant:  None  Anesthetic:  General  Disposition:  [name] is a 19 y.o. year old female,G0P0, who presents for a tubal sterilization procedure. She understands the indications for her operation as well as the alternative treatment options. She accepts the risk of, but not limited to, anesthetic complications, bleeding, infections, and possible damage to the surrounding organs. The patient understands that no guarantees can be given concerning the total relief of her discomfort.  Findings:  The uterus, fallopian tubes, and left ovary were normal. The right ovary had a thick adhesion to the right pelvic sidewall. Hyperpigmented lesions were noted in the posterior cul-de-sac. One was located on the right broad ligament. Another was located on the left uterosacral ligament. These were consistent with stage I endometriosis. There were adhesions between the bowel and the left pelvic sidewall. The appendix appeared normal. The liver and upper abdomen appeared normal. The anterior cul-de-sac appeared normal. The right ureter appeared normal.  Procedure:  The patient was taken to the operating room where a general anesthetic was given. The patient's abdomen, perineum, and vagina were prepped with sterile solution. The bladder was drained of urine using a Foley catheter. An examination under anesthesia was performed. A Hulka tenaculum was placed inside the uterus. The patient was sterilely draped. The  subumbilical area was injected with half percent Marcaine with epinephrine. A subumbilical incision was made and the incision was carried sharply through the subcutaneous tissue, the fascia, and the anterior peritoneum. The Hassan cannula was sutured into place. A pneumoperitoneum was obtained. The laparoscope was inserted. The pelvis was visualized with findings as mentioned above. Half percent Marcaine with epinephrine was injected in the left lower quadrant. A 5 mm trocar was placed and the abdominal cavity under direct visualization. An identical procedure was carried out on the opposite side. The adhesions between the right ovary and the right pelvis were lysed. The right fallopian tube was cauterized and cut. The right infundibulopelvic ligament was skeletonized. 2 Endoloop sutures of 0 by oh were placed on the right infundibular pelvic ligament. The ligament was cut freeing the right fallopian tube and the right ovary. Hemostasis was adequate. The adhesions between the bowel and the left pelvic sidewall were lysed. The posterior peritoneum underlining the endometriosis lesions was injected with half percent Marcaine with epinephrine. 2 biopsies were obtained. Hemostasis was adequate. The ovary and the right fallopian tube were removed from the umbilical port. A final check was made for hemostasis and hemostasis was confirmed. We felt that we were ready to end our procedure. The pneumoperitoneum was allowed to escape. All instruments were removed. The subumbilical fascia was closed using 0 Vicryl. The skin was reapproximated using 3-0 Monocryl. Sponge and needle counts were correct. The estimated blood loss was less than 10 cc. The patient tolerated her procedure well. She was awakened from her anesthetic without difficulty. She was transported to the recovery room in stable condition. The right fallopian tube, right ovary, and the 2 pelvic biopsies were sent to pathology.   Followup  instructions:  The  patient will return to see Dr. Raphael Gibney in 3 weeks. She was given a copy of the postoperative instructions as prepared by the [facility] for patients who've undergone laparoscopy.  Discharge medications:  Motrin 800 mg tablets            One tablet every 8 hours as needed for moderate pain. Percocet                                One or two tablets every 4 hours as needed for severe pain. Zofran ODT                           One tablet every 8 hours as needed for nausea.  Gildardo Cranker, M.D.  01/24/2015

## 2015-01-24 NOTE — Discharge Instructions (Signed)

## 2015-01-24 NOTE — Anesthesia Postprocedure Evaluation (Signed)
  Anesthesia Post-op Note  Patient: Chelsea Day  Procedure(s) Performed: Procedure(s): LAPAROSCOPIC LYSIS OF ADHESION, LAPROSCOPIC RIGHT SALPINGOOOPHORECTOMY, BIOPSY OF RIGHT BOARD LIGAMENT; LYSIS BIOPSY OF ADHESIONS, BIOPSY OF LEFT UTERO LIGAMENT (Right)  Patient Location: PACU  Anesthesia Type:General  Level of Consciousness: awake and alert   Airway and Oxygen Therapy: Patient Spontanous Breathing  Post-op Pain: mild  Post-op Assessment: Post-op Vital signs reviewed and Patient's Cardiovascular Status Stable              Post-op Vital Signs: Reviewed and stable  Last Vitals:  Filed Vitals:   01/24/15 1515  BP: 122/61  Pulse: 83  Temp:   Resp: 14    Complications: No apparent anesthesia complications

## 2015-01-24 NOTE — H&P (Signed)
The patient was interviewed and examined today.  The previously documented history and physical examination was reviewed. There are no changes. The operative procedure was reviewed. The risks and benefits were outlined again. The specific risks include, but are not limited to, anesthetic complications, bleeding, infections, and possible damage to the surrounding organs. The patient's questions were answered.  We are ready to proceed as outlined. The likelihood of the patient achieving the goals of this procedure is very likely.   BP 137/71 mmHg  Pulse 71  Temp(Src) 99 F (37.2 C)  Resp 20  Ht 5\' 10"  (1.778 m)  Wt 238 lb (107.956 kg)  BMI 34.15 kg/m2  SpO2 100%  LMP 12/24/2014  CBC    Component Value Date/Time   WBC 10.5 01/24/2015 1107   RBC 4.89 01/24/2015 1107   HGB 13.4 01/24/2015 1107   HCT 40.4 01/24/2015 1107   PLT 332 01/24/2015 1107   MCV 82.6 01/24/2015 1107   MCH 27.4 01/24/2015 1107   MCHC 33.2 01/24/2015 1107   RDW 14.3 01/24/2015 1107   LYMPHSABS 6.2* 09/22/2014 2337   MONOABS 0.8 09/22/2014 2337   EOSABS 0.3 09/22/2014 2337   BASOSABS 0.1 09/22/2014 2337    Results for orders placed or performed during the hospital encounter of 01/24/15 (from the past 24 hour(s))  CBC     Status: None   Collection Time: 01/24/15 11:07 AM  Result Value Ref Range   WBC 10.5 4.0 - 10.5 K/uL   RBC 4.89 3.87 - 5.11 MIL/uL   Hemoglobin 13.4 12.0 - 15.0 g/dL   HCT 40.4 36.0 - 46.0 %   MCV 82.6 78.0 - 100.0 fL   MCH 27.4 26.0 - 34.0 pg   MCHC 33.2 30.0 - 36.0 g/dL   RDW 14.3 11.5 - 15.5 %   Platelets 332 150 - 400 K/uL  Pregnancy, urine     Status: None   Collection Time: 01/24/15 11:13 AM  Result Value Ref Range   Preg Test, Ur NEGATIVE NEGATIVE     Gildardo Cranker, M.D.

## 2015-01-24 NOTE — Anesthesia Preprocedure Evaluation (Signed)
Anesthesia Evaluation  Patient identified by MRN, date of birth, ID band Patient awake    Reviewed: Allergy & Precautions, H&P , Patient's Chart, lab work & pertinent test results  Airway Mallampati: I  TM Distance: >3 FB Neck ROM: full    Dental no notable dental hx. (+) Teeth Intact   Pulmonary former smoker,    Pulmonary exam normal       Cardiovascular negative cardio ROS Normal cardiovascular exam    Neuro/Psych negative neurological ROS  negative psych ROS   GI/Hepatic negative GI ROS, Neg liver ROS,   Endo/Other  negative endocrine ROS  Renal/GU negative Renal ROS     Musculoskeletal   Abdominal (+) + obese,   Peds  Hematology negative hematology ROS (+)   Anesthesia Other Findings   Reproductive/Obstetrics negative OB ROS                             Anesthesia Physical Anesthesia Plan  ASA: II  Anesthesia Plan: General   Post-op Pain Management:    Induction: Intravenous  Airway Management Planned: Oral ETT  Additional Equipment:   Intra-op Plan:   Post-operative Plan: Extubation in OR  Informed Consent: I have reviewed the patients History and Physical, chart, labs and discussed the procedure including the risks, benefits and alternatives for the proposed anesthesia with the patient or authorized representative who has indicated his/her understanding and acceptance.   Dental Advisory Given  Plan Discussed with: CRNA and Surgeon  Anesthesia Plan Comments:         Anesthesia Quick Evaluation

## 2015-01-24 NOTE — Anesthesia Procedure Notes (Signed)
Procedure Name: Intubation Date/Time: 01/24/2015 12:32 PM Performed by: Brock Ra Pre-anesthesia Checklist: Patient identified, Emergency Drugs available, Suction available, Patient being monitored and Timeout performed Patient Re-evaluated:Patient Re-evaluated prior to inductionOxygen Delivery Method: Circle system utilized Preoxygenation: Pre-oxygenation with 100% oxygen Intubation Type: IV induction Ventilation: Mask ventilation without difficulty Laryngoscope Size: Mac and 3 Grade View: Grade I Tube type: Oral Tube size: 7.0 mm Number of attempts: 1 Placement Confirmation: ETT inserted through vocal cords under direct vision,  positive ETCO2 and breath sounds checked- equal and bilateral Secured at: 22 cm Tube secured with: Tape Dental Injury: Teeth and Oropharynx as per pre-operative assessment

## 2015-01-24 NOTE — Transfer of Care (Signed)
Immediate Anesthesia Transfer of Care Note  Patient: Chelsea Day  Procedure(s) Performed: Procedure(s): LAPAROSCOPIC LYSIS OF ADHESION, LAPROSCOPIC RIGHT SALPINGOOOPHORECTOMY, BIOPSY OF RIGHT BOARD LIGAMENT; LYSIS BIOPSY OF ADHESIONS, BIOPSY OF LEFT UTERO LIGAMENT (Right)  Patient Location: PACU  Anesthesia Type:General  Level of Consciousness: awake, alert , oriented and patient cooperative  Airway & Oxygen Therapy: Patient Spontanous Breathing and Patient connected to nasal cannula oxygen  Post-op Assessment: Report given to RN and Post -op Vital signs reviewed and stable  Post vital signs: Reviewed and stable  Last Vitals:  Filed Vitals:   01/24/15 1111  BP: 137/71  Pulse: 71  Temp: 37.2 C  Resp: 20    Complications: No apparent anesthesia complications

## 2015-01-27 ENCOUNTER — Encounter (HOSPITAL_COMMUNITY): Payer: Self-pay | Admitting: Obstetrics and Gynecology

## 2015-09-15 ENCOUNTER — Emergency Department (HOSPITAL_COMMUNITY): Payer: Worker's Compensation

## 2015-09-15 ENCOUNTER — Emergency Department (HOSPITAL_COMMUNITY)
Admission: EM | Admit: 2015-09-15 | Discharge: 2015-09-15 | Disposition: A | Discharge status: Home / Self Care | Payer: Worker's Compensation | Attending: Emergency Medicine | Admitting: Emergency Medicine

## 2015-09-15 ENCOUNTER — Encounter (HOSPITAL_COMMUNITY): Payer: Self-pay | Admitting: Emergency Medicine

## 2015-09-15 DIAGNOSIS — W260XXA Contact with knife, initial encounter: Secondary | ICD-10-CM | POA: Diagnosis not present

## 2015-09-15 DIAGNOSIS — Z91040 Latex allergy status: Secondary | ICD-10-CM | POA: Diagnosis not present

## 2015-09-15 DIAGNOSIS — S6992XA Unspecified injury of left wrist, hand and finger(s), initial encounter: Secondary | ICD-10-CM | POA: Diagnosis present

## 2015-09-15 DIAGNOSIS — Y9389 Activity, other specified: Secondary | ICD-10-CM | POA: Insufficient documentation

## 2015-09-15 DIAGNOSIS — Z8742 Personal history of other diseases of the female genital tract: Secondary | ICD-10-CM | POA: Insufficient documentation

## 2015-09-15 DIAGNOSIS — Y990 Civilian activity done for income or pay: Secondary | ICD-10-CM | POA: Diagnosis not present

## 2015-09-15 DIAGNOSIS — S61211A Laceration without foreign body of left index finger without damage to nail, initial encounter: Secondary | ICD-10-CM | POA: Diagnosis not present

## 2015-09-15 DIAGNOSIS — S61219A Laceration without foreign body of unspecified finger without damage to nail, initial encounter: Secondary | ICD-10-CM

## 2015-09-15 DIAGNOSIS — Y9289 Other specified places as the place of occurrence of the external cause: Secondary | ICD-10-CM | POA: Diagnosis not present

## 2015-09-15 DIAGNOSIS — Z87891 Personal history of nicotine dependence: Secondary | ICD-10-CM | POA: Diagnosis not present

## 2015-09-15 DIAGNOSIS — Z23 Encounter for immunization: Secondary | ICD-10-CM | POA: Insufficient documentation

## 2015-09-15 MED ORDER — TETANUS-DIPHTH-ACELL PERTUSSIS 5-2.5-18.5 LF-MCG/0.5 IM SUSP
0.5000 mL | Freq: Once | INTRAMUSCULAR | Status: AC
  Administered 2015-09-15: 0.5 mL via INTRAMUSCULAR
  Filled 2015-09-15: qty 0.5

## 2015-09-15 NOTE — ED Provider Notes (Signed)
CSN: [id number]     Arrival date & time 09/15/15  1103 History  By signing my name below, I, Rayna Sexton, attest that this documentation has been prepared under the direction and in the presence of [employer], PA-C. Electronically Signed: Rayna Sexton, ED Scribe. 09/15/2015. 12:53 PM.    Chief Complaint  Patient presents with  . Extremity Laceration   The history is provided by the patient. No language interpreter was used.   HPI Comments: Chelsea Day is a 20 y.o. 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 Pt notes associated, moderate, pain to the affected finger that worsens with movement. She is unsure of the timing of her last TDAP booster. Pt denies any other associated symptoms at this time.    Past Medical History  Diagnosis Date  . Bilateral ovarian cysts   . Irregular periods/menstrual cycles    Past Surgical History  Procedure Laterality Date  . Laparoscopy  11/08/2011    Procedure: LAPAROSCOPY OPERATIVE;  Surgeon: Olga Millers, MD;  Location: [facility];  Service: Gynecology;  Laterality: Right;  . Ovarian cyst removal  11/08/2011    Procedure: OVARIAN CYSTECTOMY;  Surgeon: Olga Millers, MD;  Location: [facility];  Service: Gynecology;  Laterality: Right;  . Laparoscopic ovarian cystectomy Right 01/24/2015    Procedure: LAPAROSCOPIC LYSIS OF ADHESION, LAPROSCOPIC RIGHT SALPINGOOOPHORECTOMY, BIOPSY OF RIGHT BOARD LIGAMENT; LYSIS BIOPSY OF ADHESIONS, BIOPSY OF LEFT UTERO LIGAMENT;  Surgeon: Ena Dawley, MD;  Location: [facility];  Service: Gynecology;  Laterality: Right;   Family History  Problem Relation Age of Onset  . Cancer Father     ???   Social History  Substance Use Topics  . Smoking status: Former Smoker    Types: Cigarettes    Quit date: 11/30/2013  . Smokeless tobacco: Never Used  .  Alcohol Use: 0.0 oz/week    0 Standard drinks or equivalent per week     Comment: social   OB History    Gravida Para Term Preterm AB TAB SAB Ectopic Multiple Living   0              Review of Systems A complete 10 system review of systems was obtained and all systems are negative except as noted in the HPI and PMH.   Allergies  Peanuts and Latex  Home Medications   Prior to Admission medications   Medication Sig Start Date End Date Taking? Authorizing Provider  ibuprofen (ADVIL,MOTRIN) 800 MG tablet Take 1 tablet (800 mg total) by mouth every 8 (eight) hours as needed. 01/24/15   Ena Dawley, MD  levonorgestrel-ethinyl estradiol (AVIANE,ALESSE,LESSINA) 0.1-20 MG-MCG tablet Take 1 tablet by mouth daily. Patient not taking: Reported on 01/17/2015 09/27/14   Huel Cote, NP  ondansetron (ZOFRAN ODT) 8 MG disintegrating tablet Take 1 tablet (8 mg total) by mouth every 8 (eight) hours as needed for nausea or vomiting. 01/24/15   Ena Dawley, MD  oxyCODONE-acetaminophen (PERCOCET/ROXICET) 5-325 MG per tablet Take 1 tablet by mouth every 6 (six) hours as needed for moderate pain or severe pain.    Historical Provider, MD  oxyCODONE-acetaminophen (ROXICET) 5-325 MG per tablet Take 1 tablet by mouth every 4 (four) hours as needed for severe pain. 01/24/15   Ena Dawley, MD  valACYclovir (VALTREX) 500 MG tablet Take twice daily for 3-5 days as  needed Patient taking differently: Take 500 mg by mouth 2 (two) times daily as needed. For fever blisters 09/27/14   Huel Cote, NP   BP 138/76 mmHg  Pulse 80  Temp(Src) 98.7 F (37.1 C) (Oral)  Resp 18  SpO2 100%    Physical Exam  Constitutional: She is oriented to person, place, and time. She appears well-developed and well-nourished.  HENT:  Head: Normocephalic and atraumatic.  Eyes: EOM are normal.  Neck: Normal range of motion.  Cardiovascular: Normal rate.   Pulmonary/Chest: Effort normal. No respiratory distress.  Abdominal:  Soft.  Musculoskeletal: Normal range of motion.  Neurological: She is alert and oriented to person, place, and time.  Skin: Skin is warm and dry.  1cm vertical laceration to distal tip of L index finger. No FB seen. No active bleeding. No evidence of tendon injury.  Psychiatric: She has a normal mood and affect.  Nursing note and vitals reviewed.   ED Course  Procedures  DIAGNOSTIC STUDIES: Oxygen Saturation is 100% on RA, normal by my interpretation.    COORDINATION OF CARE: 12:35 PM Discussed next steps with pt including DG of the affected finger and reevaluation based on imaging results. Pt verbalized understanding and is agreeable with the plan.   LACERATION REPAIR Performed by: Carlos Levering, PA-C Consent: Verbal consent obtained. Risks and benefits: risks, benefits and alternatives were discussed Patient identity confirmed: provided demographic data Time out performed prior to procedure Prepped and Draped in normal sterile fashion Wound explored Laceration Location: distal tip L index finger Laceration Length: 1cm No Foreign Bodies seen or palpated Irrigation method: syringe Amount of cleaning: standard Technique: dermabond Patient tolerance: Patient tolerated the procedure well with no immediate complications.   Labs Review Labs Reviewed - No data to display  Imaging Review Dg Finger Index Left  09/15/2015  CLINICAL DATA:  Laceration left index finger today. Initial encounter. EXAM: LEFT INDEX FINGER 2+V COMPARISON:  None. FINDINGS: There is no evidence of fracture or dislocation. There is no evidence of arthropathy or other focal bone abnormality. Small well corticated ossicle volar to the PIP joint left index finger is most consistent with a sesamoid bone or possibly a remote avulsion fracture. Soft tissues are unremarkable. IMPRESSION: No acute abnormality. Electronically Signed   By: Inge Rise M.D.   On: 09/15/2015 12:57   I have personally reviewed  and evaluated these images as part of my medical decision-making.   EKG Interpretation None      MDM   Final diagnoses:  Finger laceration, initial encounter    Tetanus updated in ED. Laceration occurred < 12 hours prior to repair. Repaired with dermabond. Xray negative for fx or FB. Discussed laceration care with pt and answered questions. Pt is hemodynamically stable with no complaints prior to dc.    I personally performed the services described in this documentation, which was scribed in my presence. The recorded information has been reviewed and is accurate.     Dondra Spry Derma, PA-C 09/15/15 1706  Varney Biles, MD 09/15/15 1736

## 2015-09-15 NOTE — ED Notes (Signed)
Pt states that she was cutting a bagel at work and cut her lt index finger.  Went to UC and the wait was too long.  Pt states that she tried to use liquid bandage on it but it didn't work. Bleeding controlled.  Last tetanus unknown.

## 2015-09-15 NOTE — Discharge Instructions (Signed)
Nonsutured Laceration Care °A laceration is a cut that goes through all layers of the skin and extends into the tissue that is right under the skin. This type of cut is usually stitched up (sutured) or closed with tape (adhesive strips) or skin glue shortly after the injury happens. °However, if the wound is dirty or if several hours pass before medical treatment is provided, it is likely that germs (bacteria) will enter the wound. Closing a laceration after bacteria have entered it increases the risk of infection. In these cases, your health care provider may leave the laceration open (nonsutured) and cover it with a bandage. This type of treatment helps prevent infection and allows the wound to heal from the deepest layer of tissue damage up to the surface. °An open fracture is a type of injury that may involve nonsutured lacerations. An open fracture is a break in a bone that happens along with one or more lacerations through the skin that is near the fracture site. °HOW TO CARE FOR YOUR NONSUTURED LACERATION °· Take or apply over-the-counter and prescription medicines only as told by your health care provider. °· If you were prescribed an antibiotic medicine, take or apply it as told by your health care provider. Do not stop using the antibiotic even if your condition improves. °· Clean the wound one time each day or as told by your health care provider. °¨ Wash the wound with mild soap and water. °¨ Rinse the wound with water to remove all soap. °¨ Pat your wound dry with a clean towel. Do not rub the wound. °· Do not inject anything into the wound unless your health care provider told you to. °· Change any bandages (dressings) as told by your health care provider. This includes changing the dressing if it gets wet, dirty, or starts to smell bad. °· Keep the dressing dry until your health care provider says it can be removed. Do not take baths, swim, or do anything that puts your wound underwater until your  health care provider approves. °· Raise (elevate) the injured area above the level of your heart while you are sitting or lying down, if possible. °· Do not scratch or pick at the wound. °· Check your wound every day for signs of infection. Watch for: °¨ Redness, swelling, or pain. °¨ Fluid, blood, or pus. °· Keep all follow-up visits as told by your health care provider. This is important. °SEEK MEDICAL CARE IF: °· You received a tetanus and shot and you have swelling, severe pain, redness, or bleeding at the injection site.   °· You have a fever. °· Your pain is not controlled with medicine. °· You have increased redness, swelling, or pain at the site of your wound. °· You have fluid, blood, or pus coming from your wound. °· You notice a bad smell coming from your wound or your dressing. °· You notice something coming out of the wound, such as wood or glass. °· You notice a change in the color of your skin near your wound. °· You develop a new rash. °· You need to change the dressing frequently due to fluid, blood, or pus draining from the wound. °· You develop numbness around your wound. °SEEK IMMEDIATE MEDICAL CARE IF: °· Your pain suddenly increases and is severe. °· You develop severe swelling around the wound. °· The wound is on your hand or foot and you cannot properly move a finger or toe. °· The wound is on your hand or   foot and you notice that your fingers or toes look pale or bluish.  You have a red streak going away from your wound.   This information is not intended to replace advice given to you by your health care provider. Make sure you discuss any questions you have with your health care provider.  Keep wound clean and dry. Take ibuprofen as needed for pain. Return to the ED if you experience severe worsening of your symptoms, redness or swelling around wound, fevers, chills.

## 2016-01-14 LAB — OB RESULTS CONSOLE GC/CHLAMYDIA
CHLAMYDIA, DNA PROBE: NEGATIVE
GC PROBE AMP, GENITAL: NEGATIVE

## 2016-01-14 LAB — OB RESULTS CONSOLE RPR: RPR: NONREACTIVE

## 2016-01-14 LAB — OB RESULTS CONSOLE RUBELLA ANTIBODY, IGM: Rubella: UNDETERMINED

## 2016-01-14 LAB — OB RESULTS CONSOLE HIV ANTIBODY (ROUTINE TESTING): HIV: NONREACTIVE

## 2016-01-14 LAB — OB RESULTS CONSOLE HEPATITIS B SURFACE ANTIGEN: Hepatitis B Surface Ag: NEGATIVE

## 2016-02-04 ENCOUNTER — Encounter (HOSPITAL_COMMUNITY): Payer: Self-pay | Admitting: *Deleted

## 2016-02-04 ENCOUNTER — Inpatient Hospital Stay (HOSPITAL_COMMUNITY)
Admission: AD | Admit: 2016-02-04 | Discharge: 2016-02-04 | Disposition: A | Discharge status: Home / Self Care | Payer: 59 | Source: Ambulatory Visit | Attending: Obstetrics and Gynecology | Admitting: Obstetrics and Gynecology

## 2016-02-04 ENCOUNTER — Inpatient Hospital Stay (HOSPITAL_COMMUNITY): Payer: 59

## 2016-02-04 DIAGNOSIS — Z87891 Personal history of nicotine dependence: Secondary | ICD-10-CM | POA: Diagnosis not present

## 2016-02-04 DIAGNOSIS — O26891 Other specified pregnancy related conditions, first trimester: Secondary | ICD-10-CM | POA: Diagnosis not present

## 2016-02-04 DIAGNOSIS — N83201 Unspecified ovarian cyst, right side: Secondary | ICD-10-CM | POA: Diagnosis not present

## 2016-02-04 DIAGNOSIS — O26899 Other specified pregnancy related conditions, unspecified trimester: Secondary | ICD-10-CM

## 2016-02-04 DIAGNOSIS — Z3A09 9 weeks gestation of pregnancy: Secondary | ICD-10-CM | POA: Insufficient documentation

## 2016-02-04 DIAGNOSIS — R1031 Right lower quadrant pain: Secondary | ICD-10-CM | POA: Diagnosis present

## 2016-02-04 DIAGNOSIS — O3481 Maternal care for other abnormalities of pelvic organs, first trimester: Secondary | ICD-10-CM | POA: Insufficient documentation

## 2016-02-04 DIAGNOSIS — Z91040 Latex allergy status: Secondary | ICD-10-CM | POA: Diagnosis not present

## 2016-02-04 DIAGNOSIS — R109 Unspecified abdominal pain: Secondary | ICD-10-CM

## 2016-02-04 DIAGNOSIS — Z91010 Allergy to peanuts: Secondary | ICD-10-CM | POA: Diagnosis not present

## 2016-02-04 HISTORY — DX: Endometriosis, unspecified: N80.9

## 2016-02-04 LAB — DIFFERENTIAL
Basophils Absolute: 0 10*3/uL (ref 0.0–0.1)
Basophils Relative: 0 %
EOS ABS: 0.1 10*3/uL (ref 0.0–0.7)
Eosinophils Relative: 1 %
Lymphocytes Relative: 20 %
Lymphs Abs: 3.3 10*3/uL (ref 0.7–4.0)
Monocytes Absolute: 0.7 10*3/uL (ref 0.1–1.0)
Monocytes Relative: 4 %
Neutro Abs: 12.3 10*3/uL — ABNORMAL HIGH (ref 1.7–7.7)
Neutrophils Relative %: 75 %

## 2016-02-04 LAB — CBC
HCT: 35 % — ABNORMAL LOW (ref 36.0–46.0)
Hemoglobin: 12 g/dL (ref 12.0–15.0)
MCH: 28.4 pg (ref 26.0–34.0)
MCHC: 34.3 g/dL (ref 30.0–36.0)
MCV: 82.7 fL (ref 78.0–100.0)
PLATELETS: 276 10*3/uL (ref 150–400)
RBC: 4.23 MIL/uL (ref 3.87–5.11)
RDW: 13.8 % (ref 11.5–15.5)
WBC: 15.9 10*3/uL — ABNORMAL HIGH (ref 4.0–10.5)

## 2016-02-04 LAB — URINE MICROSCOPIC-ADD ON

## 2016-02-04 LAB — ABO/RH: ABO/RH(D): A POS

## 2016-02-04 LAB — URINALYSIS, ROUTINE W REFLEX MICROSCOPIC
Bilirubin Urine: NEGATIVE
Glucose, UA: NEGATIVE mg/dL
Ketones, ur: NEGATIVE mg/dL
LEUKOCYTES UA: NEGATIVE
NITRITE: NEGATIVE
Protein, ur: NEGATIVE mg/dL
pH: 5.5 (ref 5.0–8.0)

## 2016-02-04 LAB — HCG, QUANTITATIVE, PREGNANCY: hCG, Beta Chain, Quant, S: 99182 m[IU]/mL — ABNORMAL HIGH (ref <5)

## 2016-02-04 MED ORDER — IBUPROFEN 600 MG PO TABS
600.0000 mg | ORAL_TABLET | Freq: Once | ORAL | Status: AC
  Administered 2016-02-04: 600 mg via ORAL
  Filled 2016-02-04: qty 1

## 2016-02-04 MED ORDER — OXYCODONE-ACETAMINOPHEN 5-325 MG PO TABS
1.0000 | ORAL_TABLET | Freq: Once | ORAL | Status: AC
  Administered 2016-02-04: 1 via ORAL
  Filled 2016-02-04: qty 1

## 2016-02-04 MED ORDER — OXYCODONE-ACETAMINOPHEN 5-325 MG PO TABS: 1.0000 | ORAL_TABLET | ORAL | Status: DC | PRN

## 2016-02-04 NOTE — MAU Note (Signed)
Has some pretty bad in RLQ, feels like it is starting to move to left side. Took Tylenol, no relief.

## 2016-02-04 NOTE — MAU Provider Note (Signed)
History     CSN: [id number]  Arrival date and time: 02/04/16 1657   First Provider Initiated Contact with Patient 02/04/16 1754       Chief Complaint  Patient presents with  . Abdominal Pain   HPI  Chelsea Day is a 20 y.o. G1P0 at [redacted]w[redacted]d who presents with abdominal pain. Reports constant RLQ abdominal pain that started at 3 pm today. Describes pain as sharp and radiates down right thigh. States feels similar to previous ovarian cysts. Had right salpingo-oopherectomy in 2016 & diagnosed with endometriosis at that time. Currently rates pain 6/10. Has not treated. No aggravating or alleviating factors.  Seen at [facility] last month for initial prenatal appt; has not had ultrasound done during this pregnancy.  Denies fever, dysuria, vaginal bleeding, vaginal discharge, or vomiting. Endorses nausea & diarrhea. Reports watery/loose stools x 2 months; no changes.  OB History    Gravida Para Term Preterm AB TAB SAB Ectopic Multiple Living   1               Past Medical History  Diagnosis Date  . Bilateral ovarian cysts   . Irregular periods/menstrual cycles   . Endometriosis determined by laparoscopy     Past Surgical History  Procedure Laterality Date  . Laparoscopy  11/08/2011    Procedure: LAPAROSCOPY OPERATIVE;  Surgeon: Olga Millers, MD;  Location: [facility];  Service: Gynecology;  Laterality: Right;  . Ovarian cyst removal  11/08/2011    Procedure: OVARIAN CYSTECTOMY;  Surgeon: Olga Millers, MD;  Location: [facility];  Service: Gynecology;  Laterality: Right;  . Laparoscopic ovarian cystectomy Right 01/24/2015    Procedure: LAPAROSCOPIC LYSIS OF ADHESION, LAPROSCOPIC RIGHT SALPINGOOOPHORECTOMY, BIOPSY OF RIGHT BOARD LIGAMENT; LYSIS BIOPSY OF ADHESIONS, BIOPSY OF LEFT UTERO LIGAMENT;  Surgeon: Ena Dawley, MD;  Location: [facility];  Service: Gynecology;  Laterality: Right;  . Unilateral salpingectomy Right 01/24/15    Family History  Problem Relation Age of Onset  . Cancer Father    ???    Social History  Substance Use Topics  . Smoking status: Former Smoker    Types: Cigarettes    Quit date: 11/30/2013  . Smokeless tobacco: Never Used  . Alcohol Use: 0.0 oz/week    0 Standard drinks or equivalent per week     Comment: social    Allergies:  Allergies  Allergen Reactions  . Peanuts [Peanut Oil] Anaphylaxis and Hives    Allergic to all nuts  . Latex Rash    Prescriptions prior to admission  Medication Sig Dispense Refill Last Dose  . ibuprofen (ADVIL,MOTRIN) 800 MG tablet Take 1 tablet (800 mg total) by mouth every 8 (eight) hours as needed. 50 tablet 1   . levonorgestrel-ethinyl estradiol (AVIANE,ALESSE,LESSINA) 0.1-20 MG-MCG tablet Take 1 tablet by mouth daily. (Patient not taking: Reported on 01/17/2015) 3 Package 4 Not Taking at Unknown time  . ondansetron (ZOFRAN ODT) 8 MG disintegrating tablet Take 1 tablet (8 mg total) by mouth every 8 (eight) hours as needed for nausea or vomiting. 20 tablet 0   . oxyCODONE-acetaminophen (PERCOCET/ROXICET) 5-325 MG per tablet Take 1 tablet by mouth every 6 (six) hours as needed for moderate pain or severe pain.     Marland Kitchen oxyCODONE-acetaminophen (ROXICET) 5-325 MG per tablet Take 1 tablet by mouth every 4 (four) hours as needed for severe pain. 30 tablet 0   . valACYclovir (VALTREX) 500 MG tablet Take twice daily for 3-5 days as needed (Patient taking differently: Take 500 mg  by mouth 2 (two) times daily as needed. For fever blisters) 30 tablet 0 Taking    Review of Systems  Constitutional: Negative.   Gastrointestinal: Positive for nausea, abdominal pain and diarrhea. Negative for vomiting, constipation, blood in stool and melena.  Genitourinary: Negative.    Physical Exam   Blood pressure 129/65, pulse 85, temperature 98.7 F (37.1 C), temperature source Oral, resp. rate 20, weight 234 lb (106.142 kg), last menstrual period 11/28/2015.  Physical Exam  Nursing note and vitals reviewed. Constitutional: She is oriented  to person, place, and time. She appears well-developed and well-nourished. No distress.  HENT:  Head: Normocephalic and atraumatic.  Eyes: Conjunctivae are normal. Right eye exhibits no discharge. Left eye exhibits no discharge. No scleral icterus.  Neck: Normal range of motion.  Cardiovascular: Normal rate, regular rhythm and normal heart sounds.   No murmur heard. Respiratory: Effort normal and breath sounds normal. No respiratory distress. She has no wheezes.  GI: Soft. Bowel sounds are normal. She exhibits no distension. There is no tenderness. There is no rebound and no guarding.  Genitourinary: Uterus normal. Cervix exhibits no motion tenderness. Right adnexum displays tenderness. Left adnexum displays no tenderness.  Cervix closed Adnexa difficult to palpate d/t body habitus  Neurological: She is alert and oriented to person, place, and time.  Skin: Skin is warm and dry. She is not diaphoretic.  Psychiatric: She has a normal mood and affect. Her behavior is normal. Judgment and thought content normal.    MAU Course  Procedures Results for orders placed or performed during the hospital encounter of 02/04/16 (from the past 24 hour(s))  Urinalysis, Routine w reflex microscopic (not at [facility])     Status: Abnormal   Collection Time: 02/04/16  5:25 PM  Result Value Ref Range   Color, Urine YELLOW YELLOW   APPearance CLEAR CLEAR   Specific Gravity, Urine >1.030 (H) 1.005 - 1.030   pH 5.5 5.0 - 8.0   Glucose, UA NEGATIVE NEGATIVE mg/dL   Hgb urine dipstick TRACE (A) NEGATIVE   Bilirubin Urine NEGATIVE NEGATIVE   Ketones, ur NEGATIVE NEGATIVE mg/dL   Protein, ur NEGATIVE NEGATIVE mg/dL   Nitrite NEGATIVE NEGATIVE   Leukocytes, UA NEGATIVE NEGATIVE  Urine microscopic-add on     Status: Abnormal   Collection Time: 02/04/16  5:25 PM  Result Value Ref Range   Squamous Epithelial / LPF 0-5 (A) NONE SEEN   WBC, UA 0-5 0 - 5 WBC/hpf   RBC / HPF 0-5 0 - 5 RBC/hpf   Bacteria, UA FEW (A)  NONE SEEN  CBC     Status: Abnormal   Collection Time: 02/04/16  6:34 PM  Result Value Ref Range   WBC 15.9 (H) 4.0 - 10.5 K/uL   RBC 4.23 3.87 - 5.11 MIL/uL   Hemoglobin 12.0 12.0 - 15.0 g/dL   HCT 35.0 (L) 36.0 - 46.0 %   MCV 82.7 78.0 - 100.0 fL   MCH 28.4 26.0 - 34.0 pg   MCHC 34.3 30.0 - 36.0 g/dL   RDW 13.8 11.5 - 15.5 %   Platelets 276 150 - 400 K/uL  ABO/Rh     Status: None   Collection Time: 02/04/16  6:34 PM  Result Value Ref Range   ABO/RH(D) A POS   hCG, quantitative, pregnancy     Status: Abnormal   Collection Time: 02/04/16  6:34 PM  Result Value Ref Range   hCG, Beta Chain, Quant, S 99182 (H) <5 mIU/mL  US Ob Comp Less 14 Wks  02/04/2016  CLINICAL DATA:  Pregnant patient in first-trimester pregnancy with right lower quadrant pain for 4 hours. EXAM: OBSTETRIC <14 WK ULTRASOUND TECHNIQUE: Transabdominal ultrasound was performed for evaluation of the gestation as well as the maternal uterus and adnexal regions. COMPARISON:  None. FINDINGS: Intrauterine gestational sac: Single Yolk sac:  Present. Embryo:  Present. Cardiac Activity: Present. Heart Rate: 165 bpm CRL:   28.9  mm   9 w 5 d                  [place] EDC: 09/03/2016 Subchorionic hemorrhage:  None visualized. Maternal uterus/adnexae: Large right ovarian cyst measures 7.2 x 5.8 x 7.3 cm. Blood flow seen peripherally, however normal discrete ovarian tissue is identified separate from the ovarian cyst. No internal septations. The left ovary is normal measuring 3.9 x 2.6 x 2.4 cm. There is no pelvic free fluid. IMPRESSION: 1. Single live intrauterine pregnancy estimated gestational age [redacted] weeks 5 days for estimated date of delivery 09/03/2016. 2. Right ovarian cyst measures 7.3 cm. Blood flow about the periphery of the cyst is likely within a thin rind of ovarian tissue. Electronically Signed   By: Jeb Levering M.D.   On: 02/04/2016 19:56    MDM CBC, BHCG, Abo/rh, ultrasound  2000- Care turned over to [facility], [place], NP   2027: D/W Dr. Mancel Bale. Will give ibuprofen for pain. If that does not help then patient can have percocet. Call Dr. Mancel Bale with an update on pain after medication. 2145: Patient states that ibuprofen has not helped her pain. She rates her pain 7/10 at this time. Of note, she can be heard at the nurses station laughing and talking loudly with her visitors.  2229: Patient reports that her pain is much improved with one percocet. She rates her pain 4/10 at this time.  2230: DW Dr. Mancel Bale, ok for DC home. Call for an appointment to see Dr. Raphael Gibney. Can have RX for percocet.   Assessment and Plan   1. Cyst of right ovary   2. Abdominal pain affecting pregnancy    DC home Comfort measures reviewed  1st Trimester precautions  RX: percocet PRN #[id number]  Return to MAU as needed FU with OB as planned  Follow-up Information    Schedule an appointment as soon as possible for a visit with Eli Hose, MD.   Specialty:  Obstetrics and Gynecology   Contact information:   [address] [place] [address] [phone]

## 2016-02-04 NOTE — Discharge Instructions (Signed)
First Trimester of Pregnancy The first trimester of pregnancy is from week 1 until the end of week 12 (months 1 through 3). A week after a sperm fertilizes an egg, the egg will implant on the wall of the uterus. This embryo will begin to develop into a baby. Genes from you and your partner are forming the baby. The female genes determine whether the baby is a boy or a girl. At 6-8 weeks, the eyes and face are formed, and the heartbeat can be seen on ultrasound. At the end of 12 weeks, all the baby's organs are formed.  Now that you are pregnant, you will want to do everything you can to have a healthy baby. Two of the most important things are to get good prenatal care and to follow your health care provider's instructions. Prenatal care is all the medical care you receive before the baby's birth. This care will help prevent, find, and treat any problems during the pregnancy and childbirth. BODY CHANGES Your body goes through many changes during pregnancy. The changes vary from woman to woman.   You may gain or lose a couple of pounds at first.  You may feel sick to your stomach (nauseous) and throw up (vomit). If the vomiting is uncontrollable, call your health care provider.  You may tire easily.  You may develop headaches that can be relieved by medicines approved by your health care provider.  You may urinate more often. Painful urination may mean you have a bladder infection.  You may develop heartburn as a result of your pregnancy.  You may develop constipation because certain hormones are causing the muscles that push waste through your intestines to slow down.  You may develop hemorrhoids or swollen, bulging veins (varicose veins).  Your breasts may begin to grow larger and become tender. Your nipples may stick out more, and the tissue that surrounds them (areola) may become darker.  Your gums may bleed and may be sensitive to brushing and flossing.  Dark spots or blotches (chloasma,  mask of pregnancy) may develop on your face. This will likely fade after the baby is born.  Your menstrual periods will stop.  You may have a loss of appetite.  You may develop cravings for certain kinds of food.  You may have changes in your emotions from day to day, such as being excited to be pregnant or being concerned that something may go wrong with the pregnancy and baby.  You may have more vivid and strange dreams.  You may have changes in your hair. These can include thickening of your hair, rapid growth, and changes in texture. Some women also have hair loss during or after pregnancy, or hair that feels dry or thin. Your hair will most likely return to normal after your baby is born. WHAT TO EXPECT AT YOUR PRENATAL VISITS During a routine prenatal visit:  You will be weighed to make sure you and the baby are growing normally.  Your blood pressure will be taken.  Your abdomen will be measured to track your baby's growth.  The fetal heartbeat will be listened to starting around week 10 or 12 of your pregnancy.  Test results from any previous visits will be discussed. Your health care provider may ask you:  How you are feeling.  If you are feeling the baby move.  If you have had any abnormal symptoms, such as leaking fluid, bleeding, severe headaches, or abdominal cramping.  If you are using any tobacco products,   including cigarettes, chewing tobacco, and electronic cigarettes.  If you have any questions. Other tests that may be performed during your first trimester include:  Blood tests to find your blood type and to check for the presence of any previous infections. They will also be used to check for low iron levels (anemia) and Rh antibodies. Later in the pregnancy, blood tests for diabetes will be done along with other tests if problems develop.  Urine tests to check for infections, diabetes, or protein in the urine.  An ultrasound to confirm the proper growth  and development of the baby.  An amniocentesis to check for possible genetic problems.  Fetal screens for spina bifida and Down syndrome.  You may need other tests to make sure you and the baby are doing well.  HIV (human immunodeficiency virus) testing. Routine prenatal testing includes screening for HIV, unless you choose not to have this test. HOME CARE INSTRUCTIONS  Medicines  Follow your health care provider's instructions regarding medicine use. Specific medicines may be either safe or unsafe to take during pregnancy.  Take your prenatal vitamins as directed.  If you develop constipation, try taking a stool softener if your health care provider approves. Diet  Eat regular, well-balanced meals. Choose a variety of foods, such as meat or vegetable-based protein, fish, milk and low-fat dairy products, vegetables, fruits, and whole grain breads and cereals. Your health care provider will help you determine the amount of weight gain that is right for you.  Avoid raw meat and uncooked cheese. These carry germs that can cause birth defects in the baby.  Eating four or five small meals rather than three large meals a day may help relieve nausea and vomiting. If you start to feel nauseous, eating a few soda crackers can be helpful. Drinking liquids between meals instead of during meals also seems to help nausea and vomiting.  If you develop constipation, eat more high-fiber foods, such as fresh vegetables or fruit and whole grains. Drink enough fluids to keep your urine clear or pale yellow. Activity and Exercise  Exercise only as directed by your health care provider. Exercising will help you:  Control your weight.  Stay in shape.  Be prepared for labor and delivery.  Experiencing pain or cramping in the lower abdomen or low back is a good sign that you should stop exercising. Check with your health care provider before continuing normal exercises.  Try to avoid standing for long  periods of time. Move your legs often if you must stand in one place for a long time.  Avoid heavy lifting.  Wear low-heeled shoes, and practice good posture.  You may continue to have sex unless your health care provider directs you otherwise. Relief of Pain or Discomfort  Wear a good support bra for breast tenderness.   Take warm sitz baths to soothe any pain or discomfort caused by hemorrhoids. Use hemorrhoid cream if your health care provider approves.   Rest with your legs elevated if you have leg cramps or low back pain.  If you develop varicose veins in your legs, wear support hose. Elevate your feet for 15 minutes, 3-4 times a day. Limit salt in your diet. Prenatal Care  Schedule your prenatal visits by the twelfth week of pregnancy. They are usually scheduled monthly at first, then more often in the last 2 months before delivery.  Write down your questions. Take them to your prenatal visits.  Keep all your prenatal visits as directed by your   health care provider. Safety  Wear your seat belt at all times when driving.  Make a list of emergency phone numbers, including numbers for family, friends, the hospital, and police and fire departments. General Tips  Ask your health care provider for a referral to a local prenatal education class. Begin classes no later than at the beginning of month 6 of your pregnancy.  Ask for help if you have counseling or nutritional needs during pregnancy. Your health care provider can offer advice or refer you to specialists for help with various needs.  Do not use hot tubs, steam rooms, or saunas.  Do not douche or use tampons or scented sanitary pads.  Do not cross your legs for long periods of time.  Avoid cat litter boxes and soil used by cats. These carry germs that can cause birth defects in the baby and possibly loss of the fetus by miscarriage or stillbirth.  Avoid all smoking, herbs, alcohol, and medicines not prescribed by  your health care provider. Chemicals in these affect the formation and growth of the baby.  Do not use any tobacco products, including cigarettes, chewing tobacco, and electronic cigarettes. If you need help quitting, ask your health care provider. You may receive counseling support and other resources to help you quit.  Schedule a dentist appointment. At home, brush your teeth with a soft toothbrush and be gentle when you floss. SEEK MEDICAL CARE IF:   You have dizziness.  You have mild pelvic cramps, pelvic pressure, or nagging pain in the abdominal area.  You have persistent nausea, vomiting, or diarrhea.  You have a bad smelling vaginal discharge.  You have pain with urination.  You notice increased swelling in your face, hands, legs, or ankles. SEEK IMMEDIATE MEDICAL CARE IF:   You have a fever.  You are leaking fluid from your vagina.  You have spotting or bleeding from your vagina.  You have severe abdominal cramping or pain.  You have rapid weight gain or loss.  You vomit blood or material that looks like coffee grounds.  You are exposed to German measles and have never had them.  You are exposed to fifth disease or chickenpox.  You develop a severe headache.  You have shortness of breath.  You have any kind of trauma, such as from a fall or a car accident.   This information is not intended to replace advice given to you by your health care provider. Make sure you discuss any questions you have with your health care provider.   Document Released: 07/13/2001 Document Revised: 08/09/2014 Document Reviewed: 05/29/2013 Elsevier Interactive Patient Education 2016 [employer]  

## 2016-02-05 ENCOUNTER — Other Ambulatory Visit: Payer: Self-pay | Admitting: Advanced Practice Midwife

## 2016-02-05 DIAGNOSIS — O26899 Other specified pregnancy related conditions, unspecified trimester: Secondary | ICD-10-CM

## 2016-02-05 DIAGNOSIS — R102 Pelvic and perineal pain: Principal | ICD-10-CM

## 2016-02-05 DIAGNOSIS — O26891 Other specified pregnancy related conditions, first trimester: Secondary | ICD-10-CM

## 2016-02-05 NOTE — Progress Notes (Signed)
Spoke with Dr. Mancel Bale she would like a urine culture added to patient's urine. Unable to place orders after patient has been DC >2 hours. Called the lab. They were unable to find this patient's urine. They will attempt to look for it, and will call if they can find it. Lab was able to locate the urine. Order placed for urine culture.  Mathis Bud  1:17 AM 02/05/2016

## 2016-02-05 NOTE — Progress Notes (Signed)
I have attempted to place urine culture order multiple times and in different ways. Unable to get the order to crossover the lab. Lab is not sure as to why the order will not crossover. Unable to take a verbal order for urine culture.  Mathis Bud  3:04 AM 02/05/2016

## 2016-07-21 ENCOUNTER — Inpatient Hospital Stay (HOSPITAL_COMMUNITY)
Admission: AD | Admit: 2016-07-21 | Discharge: 2016-07-21 | Disposition: A | Discharge status: Home / Self Care | Payer: 59 | Source: Ambulatory Visit | Attending: Obstetrics and Gynecology | Admitting: Obstetrics and Gynecology

## 2016-07-21 ENCOUNTER — Encounter (HOSPITAL_COMMUNITY): Payer: Self-pay | Admitting: *Deleted

## 2016-07-21 DIAGNOSIS — R109 Unspecified abdominal pain: Secondary | ICD-10-CM

## 2016-07-21 DIAGNOSIS — O4703 False labor before 37 completed weeks of gestation, third trimester: Secondary | ICD-10-CM | POA: Insufficient documentation

## 2016-07-21 DIAGNOSIS — Z87891 Personal history of nicotine dependence: Secondary | ICD-10-CM | POA: Diagnosis not present

## 2016-07-21 DIAGNOSIS — Z3A33 33 weeks gestation of pregnancy: Secondary | ICD-10-CM | POA: Diagnosis not present

## 2016-07-21 DIAGNOSIS — O479 False labor, unspecified: Secondary | ICD-10-CM

## 2016-07-21 DIAGNOSIS — O26893 Other specified pregnancy related conditions, third trimester: Secondary | ICD-10-CM

## 2016-07-21 LAB — URINALYSIS, ROUTINE W REFLEX MICROSCOPIC
Bilirubin Urine: NEGATIVE
GLUCOSE, UA: NEGATIVE mg/dL
HGB URINE DIPSTICK: NEGATIVE
Ketones, ur: NEGATIVE mg/dL
Nitrite: NEGATIVE
PROTEIN: NEGATIVE mg/dL
SPECIFIC GRAVITY, URINE: 1.008 (ref 1.005–1.030)
pH: 7 (ref 5.0–8.0)

## 2016-07-21 MED ORDER — NIFEDIPINE 10 MG PO CAPS
10.0000 mg | ORAL_CAPSULE | Freq: Once | ORAL | Status: AC
  Administered 2016-07-21: 10 mg via ORAL
  Filled 2016-07-21: qty 1

## 2016-07-21 NOTE — MAU Note (Addendum)
Patient was sent from office due to cramping/contractions, spotting and was told FHR was 180. Denies recent intercourse, was not examined in office. States cramping and contractions started 3 days ago. States pain starts in her back, then moves down and around to abdomen.

## 2016-07-21 NOTE — MAU Provider Note (Signed)
History     CSN: [id number]  Arrival date and time: 07/21/16 1131   First Provider Initiated Contact with Patient 07/21/16 1256      Chief Complaint  Patient presents with  . Abdominal Cramping   HPI   [name] is a 20 y.o. female G1P0 @ [redacted]w[redacted]d here in MAU from the office. She was scheduled for a regular appointment and had an NST. The patient was sent here and was told that her baby's heart rate was high. Since her arrival she has been feeling the baby move.   No recent intercourse, nothing in the vagina in the last 24 hours. She says she saw some brown discharge this morning mixed with mucus. No bleeding.   Since her arrival here she has continued to have abdominal cramping. The cramping started 3 days ago. The cramping is felt the most in her lower back and around to the middle of her abdomen.    OB History    Gravida Para Term Preterm AB Living   1             SAB TAB Ectopic Multiple Live Births                  Past Medical History:  Diagnosis Date  . Bilateral ovarian cysts   . Endometriosis determined by laparoscopy   . Irregular periods/menstrual cycles     Past Surgical History:  Procedure Laterality Date  . LAPAROSCOPIC OVARIAN CYSTECTOMY Right 01/24/2015   Procedure: LAPAROSCOPIC LYSIS OF ADHESION, LAPROSCOPIC RIGHT SALPINGOOOPHORECTOMY, BIOPSY OF RIGHT BOARD LIGAMENT; LYSIS BIOPSY OF ADHESIONS, BIOPSY OF LEFT UTERO LIGAMENT;  Surgeon: Ena Dawley, MD;  Location: [facility];  Service: Gynecology;  Laterality: Right;  . LAPAROSCOPY  11/08/2011   Procedure: LAPAROSCOPY OPERATIVE;  Surgeon: Olga Millers, MD;  Location: [facility];  Service: Gynecology;  Laterality: Right;  . OVARIAN CYST REMOVAL  11/08/2011   Procedure: OVARIAN CYSTECTOMY;  Surgeon: Olga Millers, MD;  Location: [facility];  Service: Gynecology;  Laterality: Right;  . UNILATERAL SALPINGECTOMY Right 01/24/15    Family History  Problem Relation Age of Onset  . Cancer Father     ???     Social History  Substance Use Topics  . Smoking status: Former Smoker    Types: Cigarettes    Quit date: 11/30/2013  . Smokeless tobacco: Never Used  . Alcohol use 0.0 oz/week     Comment: social    Allergies:  Allergies  Allergen Reactions  . Peanuts [Peanut Oil] Anaphylaxis and Hives    Allergic to all nuts  . Latex Rash    Prescriptions Prior to Admission  Medication Sig Dispense Refill Last Dose  . ondansetron (ZOFRAN ODT) 8 MG disintegrating tablet Take 1 tablet (8 mg total) by mouth every 8 (eight) hours as needed for nausea or vomiting. 20 tablet 0   . oxyCODONE-acetaminophen (PERCOCET/ROXICET) 5-325 MG tablet Take 1-2 tablets by mouth every 4 (four) hours as needed for severe pain. 10 tablet 0   . valACYclovir (VALTREX) 500 MG tablet Take twice daily for 3-5 days as needed (Patient taking differently: Take 500 mg by mouth 2 (two) times daily as needed. For fever blisters) 30 tablet 0 Taking   Results for orders placed or performed during the hospital encounter of 07/21/16 (from the past 48 hour(s))  Urinalysis, Routine w reflex microscopic     Status: Abnormal   Collection Time: 07/21/16 11:52 AM  Result Value Ref Range   Color, Urine  YELLOW YELLOW   APPearance HAZY (A) CLEAR   Specific Gravity, Urine 1.008 1.005 - 1.030   pH 7.0 5.0 - 8.0   Glucose, UA NEGATIVE NEGATIVE mg/dL   Hgb urine dipstick NEGATIVE NEGATIVE   Bilirubin Urine NEGATIVE NEGATIVE   Ketones, ur NEGATIVE NEGATIVE mg/dL   Protein, ur NEGATIVE NEGATIVE mg/dL   Nitrite NEGATIVE NEGATIVE   Leukocytes, UA MODERATE (A) NEGATIVE   RBC / HPF 0-5 0 - 5 RBC/hpf   WBC, UA 0-5 0 - 5 WBC/hpf   Bacteria, UA RARE (A) NONE SEEN   Squamous Epithelial / LPF 6-30 (A) NONE SEEN    Review of Systems  Constitutional: Negative for chills and fever.  Gastrointestinal: Negative for nausea and vomiting.   Physical Exam   Blood pressure 134/72, pulse 99, temperature 98.2 F (36.8 C), temperature source Oral,  resp. rate 18, height 5' 10.5" (1.791 m), weight 269 lb (122 kg), last menstrual period 11/28/2015, SpO2 98 %.  Physical Exam  Constitutional: She is oriented to person, place, and time. She appears well-developed and well-nourished. No distress.  HENT:  Head: Normocephalic.  Eyes: Pupils are equal, round, and reactive to light.  GI: Soft. She exhibits no distension. There is no tenderness.  Genitourinary:  Genitourinary Comments: Dilation: Closed Effacement (%): Thick Cervical Position: Posterior Station: Ballotable Presentation: Undeterminable Exam by:: Altamease Oiler, NP  Musculoskeletal: Normal range of motion.  Neurological: She is alert and oriented to person, place, and time.  Skin: Skin is warm. She is not diaphoretic.  Psychiatric: Her behavior is normal.   Fetal Tracing: Baseline: 135 bpm  Variability: Moderate  Accelerations: 15x15 Decelerations: Quick variables  Toco: occasional UI   MAU Course  Procedures  None  MDM  UA Urine culture- leukocytes found on UA.  Procardia X 1 Po. Patient feeling much better following procardia.  Discussed patient with Dr. Charlesetta Garibaldi.   Assessment and Plan   A:  1. Abdominal pain during pregnancy in third trimester   2. Braxton Hicks contractions     P:  Discharge home in stable condition Preterm labor precautions Return to MAU if symptoms worsen  Follow up with OB as scheduled  Lezlie Lye, NP 07/21/2016 7:31 PM

## 2016-07-21 NOTE — Discharge Instructions (Signed)
Braxton Hicks Contractions °Contractions of the uterus can occur throughout pregnancy. Contractions are not always a sign that you are in labor.  °WHAT ARE BRAXTON HICKS CONTRACTIONS?  °Contractions that occur before labor are called Braxton Hicks contractions, or false labor. Toward the end of pregnancy (32-34 weeks), these contractions can develop more often and may become more forceful. This is not true labor because these contractions do not result in opening (dilatation) and thinning of the cervix. They are sometimes difficult to tell apart from true labor because these contractions can be forceful and people have different pain tolerances. You should not feel embarrassed if you go to the hospital with false labor. Sometimes, the only way to tell if you are in true labor is for your health care provider to look for changes in the cervix. °If there are no prenatal problems or other health problems associated with the pregnancy, it is completely safe to be sent home with false labor and await the onset of true labor. °HOW CAN YOU TELL THE DIFFERENCE BETWEEN TRUE AND FALSE LABOR? °False Labor  °· The contractions of false labor are usually shorter and not as hard as those of true labor.   °· The contractions are usually irregular.   °· The contractions are often felt in the front of the lower abdomen and in the groin.   °· The contractions may go away when you walk around or change positions while lying down.   °· The contractions get weaker and are shorter lasting as time goes on.   °· The contractions do not usually become progressively stronger, regular, and closer together as with true labor.   °True Labor  °· Contractions in true labor last 30-70 seconds, become very regular, usually become more intense, and increase in frequency.   °· The contractions do not go away with walking.   °· The discomfort is usually felt in the top of the uterus and spreads to the lower abdomen and low back.   °· True labor can be  determined by your health care provider with an exam. This will show that the cervix is dilating and getting thinner.   °WHAT TO REMEMBER °· Keep up with your usual exercises and follow other instructions given by your health care provider.   °· Take medicines as directed by your health care provider.   °· Keep your regular prenatal appointments.   °· Eat and drink lightly if you think you are going into labor.   °· If Braxton Hicks contractions are making you uncomfortable:   °¨ Change your position from lying down or resting to walking, or from walking to resting.   °¨ Sit and rest in a tub of warm water.   °¨ Drink 2-3 glasses of water. Dehydration may cause these contractions.   °¨ Do slow and deep breathing several times an hour.   °WHEN SHOULD I SEEK IMMEDIATE MEDICAL CARE? °Seek immediate medical care if: °· Your contractions become stronger, more regular, and closer together.   °· You have fluid leaking or gushing from your vagina.   °· You have a fever.   °· You pass blood-tinged mucus.   °· You have vaginal bleeding.   °· You have continuous abdominal pain.   °· You have low back pain that you never had before.   °· You feel your baby's head pushing down and causing pelvic pressure.   °· Your baby is not moving as much as it used to.   °This information is not intended to replace advice given to you by your health care provider. Make sure you discuss any questions you have with your health care   provider. °Document Released: 07/19/2005 Document Revised: 11/10/2015 Document Reviewed: 04/30/2013 °Elsevier Interactive Patient Education © 2017 Elsevier Inc. ° °

## 2016-07-22 LAB — CULTURE, OB URINE: Special Requests: NORMAL

## 2016-08-02 DIAGNOSIS — Z8759 Personal history of other complications of pregnancy, childbirth and the puerperium: Secondary | ICD-10-CM

## 2016-08-02 HISTORY — DX: Personal history of other complications of pregnancy, childbirth and the puerperium: Z87.59

## 2016-08-02 NOTE — L&D Delivery Note (Signed)
Delivery Note At  a viable female was delivered via  (Presentation:ROA ;  vtx).  APGAR:8 ,9 ; weight pending  .   Placenta status:complete , . 3V Cord:  with the following complications:After delivery of placenta inspected to be intact, bleeding noted.  Bimanual expression used, boggy uterus noted, pt given Hemabate and 1000cc cytotec rectally.  Uterus firm after meds.  Pitocin running per Iv, mom stable.  Anesthesia:  Epidural Episiotomy:  None Lacerations:  1st degree Suture Repair: 2.0  Vicryl 4-0 Vicryl  Est. Blood Loss (mL): 1400cc    Mom to postpartum.  Baby to Couplet care / Skin to Skin.  Pleas Koch Prothero 08/13/2016, 3:54 PM

## 2016-08-11 ENCOUNTER — Inpatient Hospital Stay (HOSPITAL_COMMUNITY)
Admission: AD | Admit: 2016-08-11 | Discharge: 2016-08-15 | DRG: 774 | Disposition: A | Discharge status: Home / Self Care | Payer: BLUE CROSS/BLUE SHIELD | Source: Ambulatory Visit | Attending: Obstetrics and Gynecology | Admitting: Obstetrics and Gynecology

## 2016-08-11 ENCOUNTER — Encounter (HOSPITAL_COMMUNITY): Payer: Self-pay | Admitting: *Deleted

## 2016-08-11 DIAGNOSIS — Z87891 Personal history of nicotine dependence: Secondary | ICD-10-CM | POA: Diagnosis not present

## 2016-08-11 DIAGNOSIS — Z3A36 36 weeks gestation of pregnancy: Secondary | ICD-10-CM | POA: Diagnosis not present

## 2016-08-11 DIAGNOSIS — O99824 Streptococcus B carrier state complicating childbirth: Secondary | ICD-10-CM | POA: Diagnosis present

## 2016-08-11 DIAGNOSIS — O99214 Obesity complicating childbirth: Secondary | ICD-10-CM | POA: Diagnosis present

## 2016-08-11 DIAGNOSIS — O9081 Anemia of the puerperium: Secondary | ICD-10-CM | POA: Diagnosis not present

## 2016-08-11 DIAGNOSIS — R030 Elevated blood-pressure reading, without diagnosis of hypertension: Secondary | ICD-10-CM | POA: Diagnosis present

## 2016-08-11 DIAGNOSIS — Z23 Encounter for immunization: Secondary | ICD-10-CM

## 2016-08-11 DIAGNOSIS — Z6839 Body mass index (BMI) 39.0-39.9, adult: Secondary | ICD-10-CM

## 2016-08-11 DIAGNOSIS — O1414 Severe pre-eclampsia complicating childbirth: Secondary | ICD-10-CM | POA: Diagnosis present

## 2016-08-11 DIAGNOSIS — Z90721 Acquired absence of ovaries, unilateral: Secondary | ICD-10-CM

## 2016-08-11 DIAGNOSIS — O1413 Severe pre-eclampsia, third trimester: Secondary | ICD-10-CM | POA: Diagnosis not present

## 2016-08-11 DIAGNOSIS — Z9079 Acquired absence of other genital organ(s): Secondary | ICD-10-CM

## 2016-08-11 DIAGNOSIS — R51 Headache: Secondary | ICD-10-CM

## 2016-08-11 DIAGNOSIS — D649 Anemia, unspecified: Secondary | ICD-10-CM | POA: Diagnosis not present

## 2016-08-11 DIAGNOSIS — R519 Headache, unspecified: Secondary | ICD-10-CM

## 2016-08-11 DIAGNOSIS — B951 Streptococcus, group B, as the cause of diseases classified elsewhere: Secondary | ICD-10-CM | POA: Diagnosis present

## 2016-08-11 LAB — CBC
HEMATOCRIT: 37.1 % (ref 36.0–46.0)
HEMOGLOBIN: 12.6 g/dL (ref 12.0–15.0)
MCH: 29 pg (ref 26.0–34.0)
MCHC: 34 g/dL (ref 30.0–36.0)
MCV: 85.3 fL (ref 78.0–100.0)
Platelets: 310 10*3/uL (ref 150–400)
RBC: 4.35 MIL/uL (ref 3.87–5.11)
RDW: 14.2 % (ref 11.5–15.5)
WBC: 15.9 10*3/uL — ABNORMAL HIGH (ref 4.0–10.5)

## 2016-08-11 LAB — PROTEIN / CREATININE RATIO, URINE
Creatinine, Urine: 59 mg/dL
Total Protein, Urine: <6 mg/dL

## 2016-08-11 LAB — COMPREHENSIVE METABOLIC PANEL
ALBUMIN: 3.2 g/dL — AB (ref 3.5–5.0)
ALK PHOS: 151 U/L — AB (ref 38–126)
ALT: 17 U/L (ref 14–54)
ANION GAP: 7 (ref 5–15)
AST: 16 U/L (ref 15–41)
BUN: 8 mg/dL (ref 6–20)
CALCIUM: 9.1 mg/dL (ref 8.9–10.3)
CO2: 22 mmol/L (ref 22–32)
Chloride: 106 mmol/L (ref 101–111)
Creatinine, Ser: 0.52 mg/dL (ref 0.44–1.00)
Glucose, Bld: 91 mg/dL (ref 65–99)
POTASSIUM: 3.4 mmol/L — AB (ref 3.5–5.1)
Sodium: 135 mmol/L (ref 135–145)
Total Bilirubin: 0.1 mg/dL — ABNORMAL LOW (ref 0.3–1.2)
Total Protein: 7.4 g/dL (ref 6.5–8.1)

## 2016-08-11 LAB — TYPE AND SCREEN
ABO/RH(D): A POS
Antibody Screen: NEGATIVE

## 2016-08-11 MED ORDER — METOCLOPRAMIDE HCL 5 MG/ML IJ SOLN
10.0000 mg | Freq: Once | INTRAMUSCULAR | Status: AC
  Administered 2016-08-11: 10 mg via INTRAVENOUS
  Filled 2016-08-11: qty 2

## 2016-08-11 MED ORDER — DOCUSATE SODIUM 100 MG PO CAPS
100.0000 mg | ORAL_CAPSULE | Freq: Every day | ORAL | Status: DC
  Filled 2016-08-11: qty 1

## 2016-08-11 MED ORDER — LACTATED RINGERS IV BOLUS (SEPSIS)
1000.0000 mL | Freq: Once | INTRAVENOUS | Status: AC
  Administered 2016-08-11: 1000 mL via INTRAVENOUS

## 2016-08-11 MED ORDER — LACTATED RINGERS IV SOLN
INTRAVENOUS | Status: DC
  Administered 2016-08-11: 19:00:00 via INTRAVENOUS

## 2016-08-11 MED ORDER — ACETAMINOPHEN 325 MG PO TABS: 650.0000 mg | ORAL_TABLET | ORAL | Status: DC | PRN

## 2016-08-11 MED ORDER — DIPHENHYDRAMINE HCL 25 MG PO CAPS
25.0000 mg | ORAL_CAPSULE | Freq: Four times a day (QID) | ORAL | Status: DC | PRN
  Administered 2016-08-11: 25 mg via ORAL
  Filled 2016-08-11: qty 1

## 2016-08-11 MED ORDER — BUTALBITAL-APAP-CAFFEINE 50-325-40 MG PO TABS
2.0000 | ORAL_TABLET | Freq: Once | ORAL | Status: AC
  Administered 2016-08-11: 2 via ORAL
  Filled 2016-08-11: qty 2

## 2016-08-11 MED ORDER — BUTALBITAL-APAP-CAFFEINE 50-325-40 MG PO TABS
1.0000 | ORAL_TABLET | ORAL | Status: DC | PRN
  Administered 2016-08-12: 2 via ORAL
  Filled 2016-08-11 (×2): qty 2

## 2016-08-11 MED ORDER — PRENATAL MULTIVITAMIN CH
1.0000 | ORAL_TABLET | Freq: Every day | ORAL | Status: DC
  Filled 2016-08-11: qty 1

## 2016-08-11 MED ORDER — CALCIUM CARBONATE ANTACID 500 MG PO CHEW: 2.0000 | CHEWABLE_TABLET | Freq: Two times a day (BID) | ORAL | Status: DC | PRN

## 2016-08-11 MED ORDER — OXYCODONE HCL 5 MG PO TABS
5.0000 mg | ORAL_TABLET | Freq: Four times a day (QID) | ORAL | Status: DC | PRN
  Administered 2016-08-11 (×2): 5 mg via ORAL
  Administered 2016-08-12: 10 mg via ORAL
  Administered 2016-08-12 – 2016-08-14 (×3): 5 mg via ORAL
  Filled 2016-08-11 (×5): qty 1
  Filled 2016-08-11: qty 2

## 2016-08-11 MED ORDER — CALCIUM CARBONATE ANTACID 500 MG PO CHEW: 2.0000 | CHEWABLE_TABLET | ORAL | Status: DC | PRN

## 2016-08-11 MED ORDER — ZOLPIDEM TARTRATE 5 MG PO TABS
5.0000 mg | ORAL_TABLET | Freq: Every evening | ORAL | Status: DC | PRN
  Administered 2016-08-12: 5 mg via ORAL
  Filled 2016-08-11: qty 1

## 2016-08-11 MED ORDER — FAMOTIDINE 20 MG PO TABS
20.0000 mg | ORAL_TABLET | Freq: Two times a day (BID) | ORAL | Status: DC
  Administered 2016-08-11: 20 mg via ORAL
  Filled 2016-08-11: qty 1

## 2016-08-11 MED ORDER — DIPHENHYDRAMINE HCL 25 MG PO CAPS: 25.0000 mg | ORAL_CAPSULE | Freq: Three times a day (TID) | ORAL | Status: DC | PRN

## 2016-08-11 MED ORDER — ACETAMINOPHEN 500 MG PO TABS
1000.0000 mg | ORAL_TABLET | Freq: Once | ORAL | Status: AC
  Administered 2016-08-11: 1000 mg via ORAL
  Filled 2016-08-11: qty 2

## 2016-08-11 MED ORDER — BETAMETHASONE SOD PHOS & ACET 6 (3-3) MG/ML IJ SUSP
12.0000 mg | Freq: Once | INTRAMUSCULAR | Status: AC
  Administered 2016-08-11: 12 mg via INTRAMUSCULAR
  Filled 2016-08-11: qty 2

## 2016-08-11 MED ORDER — METOCLOPRAMIDE HCL 5 MG/ML IJ SOLN
10.0000 mg | Freq: Four times a day (QID) | INTRAMUSCULAR | Status: DC | PRN
  Administered 2016-08-11: 10 mg via INTRAVENOUS
  Filled 2016-08-11: qty 2

## 2016-08-11 NOTE — MAU Provider Note (Signed)
History     CSN: [id number]  Arrival date and time: 08/11/16 1319   First Provider Initiated Contact with Patient 08/11/16 1424      Chief Complaint  Patient presents with  . Hypertension  . Headache   HPI   [name] is a 21 y.o. female G1P0 @ 69w5dhere in MAU with headache. She does not have a history of HA/Migraines. This HA came on all of a sudden.  She was seen in the office today and sent here due to her BP being elevated.   OB History    Gravida Para Term Preterm AB Living   1             SAB TAB Ectopic Multiple Live Births                  Past Medical History:  Diagnosis Date  . Bilateral ovarian cysts   . Endometriosis determined by laparoscopy   . Irregular periods/menstrual cycles     Past Surgical History:  Procedure Laterality Date  . LAPAROSCOPIC OVARIAN CYSTECTOMY Right 01/24/2015   Procedure: LAPAROSCOPIC LYSIS OF ADHESION, LAPROSCOPIC RIGHT SALPINGOOOPHORECTOMY, BIOPSY OF RIGHT BOARD LIGAMENT; LYSIS BIOPSY OF ADHESIONS, BIOPSY OF LEFT UTERO LIGAMENT;  Surgeon: AEna Dawley MD;  [facility];  Service: Gynecology;  Laterality: Right;  . LAPAROSCOPY  11/08/2011   Procedure: LAPAROSCOPY OPERATIVE;  Surgeon: MOlga Millers MD;  [facility];  Service: Gynecology;  Laterality: Right;  . OVARIAN CYST REMOVAL  11/08/2011   Procedure: OVARIAN CYSTECTOMY;  Surgeon: MOlga Millers MD;  [facility];  Service: Gynecology;  Laterality: Right;  . UNILATERAL SALPINGECTOMY Right 01/24/15    Family History  Problem Relation Age of Onset  . Cancer Father     ???    Social History  Substance Use Topics  . Smoking status: Former Smoker    Types: Cigarettes    Quit date: 11/30/2013  . Smokeless tobacco: Never Used  . Alcohol use 0.0 oz/week     Comment: social    Allergies:  Allergies  Allergen Reactions  . Peanuts [Peanut Oil] Anaphylaxis and Hives    Allergic to all nuts  . Latex Rash    Prescriptions Prior to Admission   Medication Sig Dispense Refill Last Dose  . acetaminophen (TYLENOL) 325 MG tablet Take 650 mg by mouth every 6 (six) hours as needed for headache.   08/10/2016 at Unknown time  . clove oil liquid Apply 2 drops topically as needed. Uses when she does feel well.   08/02/2016  . Prenatal Vit-Fe Fumarate-FA (PRENATAL MULTIVITAMIN) TABS tablet Take 1 tablet by mouth daily at 12 noon.   08/10/2016 at Unknown time  . valACYclovir (VALTREX) 500 MG tablet Take twice daily for 3-5 days as needed (Patient taking differently: Take 500 mg by mouth 2 (two) times daily as needed. For fever blisters) 30 tablet 0 Taking   Results for orders placed or performed during the hospital encounter of 08/11/16 (from the past 48 hour(s))  Protein / creatinine ratio, urine     Status: None   Collection Time: 08/11/16  1:38 PM  Result Value Ref Range   Creatinine, Urine 59.00 mg/dL   Total Protein, Urine <6 mg/dL    Comment: REPEATED TO VERIFY NO NORMAL RANGE ESTABLISHED FOR THIS TEST    Protein Creatinine Ratio        0.00 - 0.15 mg/mg[Cre]    Comment: RESULT BELOW REPORTABLE RANGE, UNABLE TO CALCULATE.  CBC     Status: Abnormal   Collection Time: 08/11/16  2:45 PM  Result Value Ref Range   WBC 15.9 (H) 4.0 - 10.5 K/uL   RBC 4.35 3.87 - 5.11 MIL/uL   Hemoglobin 12.6 12.0 - 15.0 g/dL   HCT 37.1 36.0 - 46.0 %   MCV 85.3 78.0 - 100.0 fL   MCH 29.0 26.0 - 34.0 pg   MCHC 34.0 30.0 - 36.0 g/dL   RDW 14.2 11.5 - 15.5 %   Platelets 310 150 - 400 K/uL  Comprehensive metabolic panel     Status: Abnormal   Collection Time: 08/11/16  2:45 PM  Result Value Ref Range   Sodium 135 135 - 145 mmol/L   Potassium 3.4 (L) 3.5 - 5.1 mmol/L   Chloride 106 101 - 111 mmol/L   CO2 22 22 - 32 mmol/L   Glucose, Bld 91 65 - 99 mg/dL   BUN 8 6 - 20 mg/dL   Creatinine, Ser 0.52 0.44 - 1.00 mg/dL   Calcium 9.1 8.9 - 10.3 mg/dL   Total Protein 7.4 6.5 - 8.1 g/dL   Albumin 3.2 (L) 3.5 - 5.0 g/dL   AST 16 15 - 41 U/L   ALT 17 14 - 54  U/L   Alkaline Phosphatase 151 (H) 38 - 126 U/L   Total Bilirubin 0.1 (L) 0.3 - 1.2 mg/dL   GFR calc non Af Amer >60 >60 mL/min   GFR calc Af Amer >60 >60 mL/min    Comment: (NOTE) The eGFR has been calculated using the CKD EPI equation. This calculation has not been validated in all clinical situations. eGFR's persistently <60 mL/min signify possible Chronic Kidney Disease.    Anion gap 7 5 - 15    Review of Systems  Eyes: Positive for visual disturbance.  Gastrointestinal: Positive for abdominal pain.  Neurological: Positive for headaches.   Physical Exam   Blood pressure 150/85, pulse 112, temperature 98 F (36.7 C), temperature source Oral, resp. rate 20, last menstrual period 11/28/2015.   Patient Vitals for the past 24 hrs:  BP Temp Temp src Pulse Resp  08/11/16 1613 144/90 - - 108 -  08/11/16 1558 137/89 - - 106 -  08/11/16 1543 134/83 - - 97 -  08/11/16 1528 149/68 - - 111 -  08/11/16 1513 147/96 - - 104 -  08/11/16 1458 140/80 - - 107 -  08/11/16 1443 145/91 - - 108 -  08/11/16 1428 158/87 - - 106 -  08/11/16 1413 150/85 - - 112 -  08/11/16 1357 146/96 - - 116 -  08/11/16 1352 159/92 - - 107 -  08/11/16 1332 162/88 98 F (36.7 C) Oral 115 20    Patient Vitals for the past 24 hrs:  BP Temp Temp src Pulse Resp  08/11/16 1513 147/96 - - 104 -  08/11/16 1458 140/80 - - 107 -  08/11/16 1443 145/91 - - 108 -  08/11/16 1428 158/87 - - 106 -  08/11/16 1413 150/85 - - 112 -  08/11/16 1357 146/96 - - 116 -  08/11/16 1352 159/92 - - 107 -  08/11/16 1332 162/88 98 F (36.7 C) Oral 115 20    Physical Exam  Constitutional: She is oriented to person, place, and time. She appears well-developed and well-nourished. No distress.  HENT:  Head: Normocephalic.  Eyes: Pupils are equal, round, and reactive to light.  Respiratory: Effort normal and breath sounds normal.  GI: Soft. She exhibits no distension.  There is no tenderness. There is no rebound.  Musculoskeletal:  Normal range of motion.  Neurological: She is alert and oriented to person, place, and time. She has normal reflexes. She displays normal reflexes.  Negative clonus   Skin: Skin is warm. She is not diaphoretic.  Psychiatric: Her behavior is normal.   Fetal Tracing: Baseline: 150 bpm  Variability: Moderate  Accelerations: 15x15 Decelerations: None Toco: quiet   MAU Course  Procedures  None  MDM  PIH labs Tylenol 1 gram given PO  Discussed labs, HPI and patients HA status. Tylenol given with no relief of HA. Betamethasone  Fioricet 2 tabs, Reglan IV and LR bolus per Dr. Mancel Bale.  HA resolved briefly, however returned. Discussed with Dr. Mancel Bale. Will admit and observe at this time. NST Q-shift per Dr. Mancel Bale  Patient rates her HA pain 6/10 "It feels the same as when I arrived".   Assessment and Plan   A:  1. Severe pre-eclampsia in third trimester     P:  Admit to Ante Betamethasone  NST Q-shift Call Dr. Mancel Bale for further plan of care.   Lezlie Lye, NP 08/11/2016 5:45 PM

## 2016-08-11 NOTE — MAU Note (Signed)
Pt sent from MD office, C/O severe HA's for the last week, seeing spots, legs swelling.  BP was increased in office.  Having braxton hicks contractions, denies bleeding or LOF.

## 2016-08-11 NOTE — H&P (Signed)
Chelsea Day is a 21 y.o. female presenting for evaluation after elevated BP and new onset HA in office.  Patient history significant as listed below and has allergies to latex and peanuts.  Patient reports ongoing HA, spotty vision, but denies SOB, epigastric pain, and N/V.  Patient also denies history of migraines.  Patient had fiorcet and reglan and reports eating a sub, but headache remains.   Patient Active Problem List   Diagnosis Date Noted  . Hypertension in pregnancy, preeclampsia, severe, third trimester 08/11/2016  . Group beta Strep positive 08/11/2016  . History of right salpingo-oophorectomy 08/11/2016  . Need for vaccination for rubella-Equivocal 08/11/2016    OB History    Gravida Para Term Preterm AB Living   1             SAB TAB Ectopic Multiple Live Births                 Past Medical History:  Diagnosis Date  . Bilateral ovarian cysts   . Endometriosis determined by laparoscopy   . Irregular periods/menstrual cycles    Past Surgical History:  Procedure Laterality Date  . LAPAROSCOPIC OVARIAN CYSTECTOMY Right 01/24/2015   Procedure: LAPAROSCOPIC LYSIS OF ADHESION, LAPROSCOPIC RIGHT SALPINGOOOPHORECTOMY, BIOPSY OF RIGHT BOARD LIGAMENT; LYSIS BIOPSY OF ADHESIONS, BIOPSY OF LEFT UTERO LIGAMENT;  Surgeon: Ena Dawley, MD;  Location: [facility];  Service: Gynecology;  Laterality: Right;  . LAPAROSCOPY  11/08/2011   Procedure: LAPAROSCOPY OPERATIVE;  Surgeon: Olga Millers, MD;  Location: [facility];  Service: Gynecology;  Laterality: Right;  . OVARIAN CYST REMOVAL  11/08/2011   Procedure: OVARIAN CYSTECTOMY;  Surgeon: Olga Millers, MD;  Location: [facility];  Service: Gynecology;  Laterality: Right;  . UNILATERAL SALPINGECTOMY Right 01/24/15   Family History: family history includes Cancer in her father. Social History:  reports that she quit smoking about 2 years ago. Her smoking use included Cigarettes. She has never used smokeless tobacco. She reports that she drinks  alcohol. She reports that she does not use drugs.     Maternal Diabetes: No Genetic Screening: Normal Maternal Ultrasounds/Referrals: Normal Fetal Ultrasounds or other Referrals:  None Maternal Substance Abuse:  No Significant Maternal Medications:  None Significant Maternal Lab Results:  Lab values include: Group B Strep positive, Other: Rubella Equivocal Other Comments:  None  Review of Systems  Constitutional: Negative.   Eyes: Positive for double vision (Spots).  Respiratory: Negative for shortness of breath.   Gastrointestinal: Negative for abdominal pain.  Neurological: Positive for headaches.   Maternal Medical History:  Fetal activity: Perceived fetal activity is normal.   Last perceived fetal movement was within the past hour.    Prenatal complications: Pre-eclampsia.   Prenatal Complications - Diabetes: none.      BPP 1/10: AFI is Normal=40th% EFW= 2936 g 47% BPP 8/8   Blood pressure 138/65, pulse (!) 112, temperature 99 F (37.2 C), temperature source Oral, resp. rate 18, last menstrual period 11/28/2015, SpO2 100 %. Maternal Exam:  Abdomen: Patient reports no abdominal tenderness. Introitus: not evaluated.     Fetal Exam Fetal Monitor Review: Baseline rate: 150.  Variability: moderate (6-25 bpm).   Pattern: accelerations present and no decelerations.    Fetal State Assessment: Category I - tracings are normal.     Physical Exam  Vitals reviewed. Constitutional: She is oriented to person, place, and time. She appears well-developed and well-nourished. She appears distressed.  HENT:  Head: Normocephalic and atraumatic.  Eyes: Conjunctivae are normal.  Neck: Normal range of motion.  Cardiovascular: Normal rate, regular rhythm and normal heart sounds.   Respiratory: Effort normal and breath sounds normal.  GI: Soft. Bowel sounds are normal.  Musculoskeletal: Normal range of motion. She exhibits edema (Nonpitting).  Neurological: She is alert and  oriented to person, place, and time.  Skin: Skin is warm and dry.    Prenatal labs: ABO, Rh: --/--/A POS (01/10 1445) Antibody: NEG (01/10 1445) Rubella:  Equivocal RPR:   NR HBsAg:   Negative HIV:   NR GBS:   Positive  Assessment/Plan: IUP at 36.5wks Reactive NST Severe PreEclampsia  Admit to antepartum by Dr. Gillermo Murdoch Routine Antepartum Orders per [facility] Guideline Repeat [place] labs in AM NST Q Shift BMZ per Protocol  Maryann Conners 08/11/2016, 8:31 PM

## 2016-08-12 ENCOUNTER — Inpatient Hospital Stay (HOSPITAL_COMMUNITY): Payer: BLUE CROSS/BLUE SHIELD

## 2016-08-12 ENCOUNTER — Encounter (HOSPITAL_COMMUNITY): Payer: Self-pay

## 2016-08-12 LAB — COMPREHENSIVE METABOLIC PANEL
ALBUMIN: 3.1 g/dL — AB (ref 3.5–5.0)
ALK PHOS: 145 U/L — AB (ref 38–126)
ALT: 15 U/L (ref 14–54)
ALT: 8 U/L — AB (ref 14–54)
ANION GAP: 8 (ref 5–15)
AST: 13 U/L — AB (ref 15–41)
AST: 16 U/L (ref 15–41)
Albumin: 3.1 g/dL — ABNORMAL LOW (ref 3.5–5.0)
Alkaline Phosphatase: 147 U/L — ABNORMAL HIGH (ref 38–126)
Anion gap: 8 (ref 5–15)
BUN: 7 mg/dL (ref 6–20)
BUN: 7 mg/dL (ref 6–20)
CALCIUM: 7.6 mg/dL — AB (ref 8.9–10.3)
CHLORIDE: 105 mmol/L (ref 101–111)
CO2: 22 mmol/L (ref 22–32)
CO2: 22 mmol/L (ref 22–32)
CREATININE: 0.51 mg/dL (ref 0.44–1.00)
CREATININE: 0.55 mg/dL (ref 0.44–1.00)
Calcium: 8.1 mg/dL — ABNORMAL LOW (ref 8.9–10.3)
Chloride: 103 mmol/L (ref 101–111)
GFR calc Af Amer: >60 mL/min (ref >60)
GFR calc Af Amer: >60 mL/min (ref >60)
GFR calc non Af Amer: >60 mL/min (ref >60)
GFR calc non Af Amer: >60 mL/min (ref >60)
GLUCOSE: 131 mg/dL — AB (ref 65–99)
Glucose, Bld: 103 mg/dL — ABNORMAL HIGH (ref 65–99)
Potassium: 3.9 mmol/L (ref 3.5–5.1)
Potassium: 3.8 mmol/L (ref 3.5–5.1)
SODIUM: 135 mmol/L (ref 135–145)
SODIUM: 133 mmol/L — AB (ref 135–145)
Total Bilirubin: 0.5 mg/dL (ref 0.3–1.2)
Total Bilirubin: 0.3 mg/dL (ref 0.3–1.2)
Total Protein: 7.1 g/dL (ref 6.5–8.1)
Total Protein: 7 g/dL (ref 6.5–8.1)

## 2016-08-12 LAB — RAPID HIV SCREEN (HIV 1/2 AB+AG)
HIV 1/2 Antibodies: NONREACTIVE
HIV-1 P24 Antigen - HIV24: NONREACTIVE

## 2016-08-12 LAB — CBC
HCT: 35.5 % — ABNORMAL LOW (ref 36.0–46.0)
Hemoglobin: 12 g/dL (ref 12.0–15.0)
MCH: 29 pg (ref 26.0–34.0)
MCHC: 33.8 g/dL (ref 30.0–36.0)
MCV: 85.7 fL (ref 78.0–100.0)
PLATELETS: 323 10*3/uL (ref 150–400)
RBC: 4.14 MIL/uL (ref 3.87–5.11)
RDW: 14.3 % (ref 11.5–15.5)
WBC: 25 10*3/uL — ABNORMAL HIGH (ref 4.0–10.5)

## 2016-08-12 LAB — CBC WITH DIFFERENTIAL/PLATELET
BASOS PCT: 0 %
Basophils Absolute: 0 10*3/uL (ref 0.0–0.1)
Eosinophils Absolute: 0 10*3/uL (ref 0.0–0.7)
Eosinophils Relative: 0 %
HEMATOCRIT: 35.3 % — AB (ref 36.0–46.0)
Hemoglobin: 12.1 g/dL (ref 12.0–15.0)
Lymphocytes Relative: 17 %
Lymphs Abs: 4.1 10*3/uL — ABNORMAL HIGH (ref 0.7–4.0)
MCH: 29.5 pg (ref 26.0–34.0)
MCHC: 34.3 g/dL (ref 30.0–36.0)
MCV: 86.1 fL (ref 78.0–100.0)
MONOS PCT: 2 %
Monocytes Absolute: 0.6 10*3/uL (ref 0.1–1.0)
NEUTROS ABS: 19.7 10*3/uL — AB (ref 1.7–7.7)
Neutrophils Relative %: 81 %
Platelets: 309 10*3/uL (ref 150–400)
RBC: 4.1 MIL/uL (ref 3.87–5.11)
RDW: 14.4 % (ref 11.5–15.5)
WBC: 24.5 10*3/uL — ABNORMAL HIGH (ref 4.0–10.5)

## 2016-08-12 LAB — URIC ACID: Uric Acid, Serum: 4.9 mg/dL (ref 2.3–6.6)

## 2016-08-12 LAB — LACTATE DEHYDROGENASE: LDH: 155 U/L (ref 98–192)

## 2016-08-12 LAB — RPR: RPR: NONREACTIVE

## 2016-08-12 LAB — MAGNESIUM
Magnesium: 3.8 mg/dL — ABNORMAL HIGH (ref 1.7–2.4)
Magnesium: 4.1 mg/dL — ABNORMAL HIGH (ref 1.7–2.4)

## 2016-08-12 MED ORDER — TERBUTALINE SULFATE 1 MG/ML IJ SOLN: 0.2500 mg | Freq: Once | INTRAMUSCULAR | Status: DC | PRN

## 2016-08-12 MED ORDER — PENICILLIN G POTASSIUM 5000000 UNITS IJ SOLR
5.0000 10*6.[IU] | Freq: Once | INTRAVENOUS | Status: DC | PRN
  Filled 2016-08-12: qty 5

## 2016-08-12 MED ORDER — MAGNESIUM SULFATE 50 % IJ SOLN
2.0000 g/h | INTRAVENOUS | Status: DC
  Administered 2016-08-12 – 2016-08-13 (×3): 2 g/h via INTRAVENOUS
  Filled 2016-08-12 (×3): qty 80

## 2016-08-12 MED ORDER — LACTATED RINGERS IV SOLN
INTRAVENOUS | Status: DC
  Administered 2016-08-12 – 2016-08-13 (×4): via INTRAVENOUS

## 2016-08-12 MED ORDER — ACETAMINOPHEN 325 MG PO TABS: 650.0000 mg | ORAL_TABLET | ORAL | Status: DC | PRN

## 2016-08-12 MED ORDER — BETAMETHASONE SOD PHOS & ACET 6 (3-3) MG/ML IJ SUSP
12.0000 mg | Freq: Once | INTRAMUSCULAR | Status: AC
  Administered 2016-08-12: 12 mg via INTRAMUSCULAR
  Filled 2016-08-12: qty 2

## 2016-08-12 MED ORDER — BUTORPHANOL TARTRATE 10 MG/ML NA SOLN
1.0000 | NASAL | Status: DC | PRN
  Administered 2016-08-12: 1 via NASAL
  Filled 2016-08-12: qty 2.5

## 2016-08-12 MED ORDER — LACTATED RINGERS IV SOLN: 500.0000 mL | INTRAVENOUS | Status: DC | PRN

## 2016-08-12 MED ORDER — MAGNESIUM SULFATE BOLUS VIA INFUSION
4.0000 g | Freq: Once | INTRAVENOUS | Status: AC
  Administered 2016-08-12: 4 g via INTRAVENOUS
  Filled 2016-08-12: qty 500

## 2016-08-12 MED ORDER — ONDANSETRON HCL 4 MG/2ML IJ SOLN: 4.0000 mg | Freq: Four times a day (QID) | INTRAMUSCULAR | Status: DC | PRN

## 2016-08-12 MED ORDER — SOD CITRATE-CITRIC ACID 500-334 MG/5ML PO SOLN
30.0000 mL | ORAL | Status: DC | PRN
Start: 2016-08-12 — End: 2016-08-13

## 2016-08-12 MED ORDER — FENTANYL CITRATE (PF) 100 MCG/2ML IJ SOLN
50.0000 ug | INTRAMUSCULAR | Status: DC | PRN
  Administered 2016-08-12: 100 ug via INTRAVENOUS
  Filled 2016-08-12: qty 2

## 2016-08-12 MED ORDER — OXYTOCIN 40 UNITS IN LACTATED RINGERS INFUSION - SIMPLE MED
2.5000 [IU]/h | INTRAVENOUS | Status: DC
  Filled 2016-08-12: qty 1000

## 2016-08-12 MED ORDER — OXYTOCIN BOLUS FROM INFUSION
500.0000 mL | Freq: Once | INTRAVENOUS | Status: AC
  Administered 2016-08-13: 500 mL via INTRAVENOUS

## 2016-08-12 MED ORDER — PENICILLIN G POT IN DEXTROSE 60000 UNIT/ML IV SOLN
3.0000 10*6.[IU] | INTRAVENOUS | Status: DC | PRN
  Filled 2016-08-12: qty 50

## 2016-08-12 MED ORDER — MISOPROSTOL 25 MCG QUARTER TABLET
25.0000 ug | ORAL_TABLET | ORAL | Status: DC | PRN
  Administered 2016-08-12 (×4): 25 ug via VAGINAL
  Filled 2016-08-12 (×4): qty 0.25

## 2016-08-12 MED ORDER — LIDOCAINE HCL (PF) 1 % IJ SOLN
30.0000 mL | INTRAMUSCULAR | Status: DC | PRN
  Filled 2016-08-12: qty 30

## 2016-08-12 NOTE — Progress Notes (Signed)
Chelsea Day MRN: [id number]  Care assumed of 20y.o. G1P0 who presents for IOL secondary to PreEclampsia with SF.  Patient PIH labs WNL and BP elevated, but not in treatable range.  GBS positive.   Subjective: -Patient resting in bed.  Reports continued headache of 6/10.  Admits some relief with oxycodone and fiorcet dosing, but "only for 2 hours."  Patient also reports spotty vision when headache increases in intensity.  Patient denies epigastric pain, SOB, and numbness/tingling.  Reports perception and discomfort with contractions.   Objective: BP (!) 144/60   Pulse 87   Temp 98.1 F (36.7 C) (Oral)   Resp (!) 24   Ht 5' 10.5" (1.791 m)   Wt 125.6 kg (277 lb)   LMP 11/28/2015   SpO2 98%   BMI 39.18 kg/m  I/O last 3 completed shifts: In: 5302.9 [P.O.:3000; I.V.:2302.9] Out: 3875 [Urine:3875] No intake/output data recorded.   MgSO4 Infusing  Fetal Monitoring: FHT: 135 bpm, Mod Var, -Decels, +Accels UC: Irregular    Physical Exam: General: Alert, Oriented, Mild Distress Chest: HRRR, Lungs CTA Abd: Soft Resting Tone Extremities: Pitting Edema in BLE, +2DTRs Skin: Warm, Dry  Vaginal Exam: SVE:   Dilation: 1.5 Effacement (%): 50 Station: -2 Exam [id number], CNM Membranes:Intact Internal Monitors: None  Augmentation/Induction: Pitocin:None Cytotec: S/P 3 Doses Foley Bulb Placed without Issues  Assessment:  IUP at 36.6wks Cat I FT  PreEclampsia IOL Bishop Score: 6  Plan: -Reiterated R/B of foley bulb placement; No q/c -Foley Bulb placed without difficulty -Give fentanyl for pain and okay for ambien for sleep -Will reassess and start pitocin, if appropriate, at 0400 -Continue other mgmt as ordered  [name], CNM 08/12/2016, 9:30 PM

## 2016-08-12 NOTE — Anesthesia Pain Management Evaluation Note (Signed)
  CRNA Pain Management Visit Note  Patient: Chelsea Day, 21 y.o., female  "Hello I am a member of the anesthesia team at [facility]. We have an anesthesia team available at all times to provide care throughout the hospital, including epidural management and anesthesia for C-section. I don't know your plan for the delivery whether it a natural birth, water birth, IV sedation, nitrous supplementation, doula or epidural, but we want to meet your pain goals."   1.Was your pain managed to your expectations on prior hospitalizations?   No prior hospitalizations  2.What is your expectation for pain management during this hospitalization?     Labor support without medications  3.How can we help you reach that goal? unsure  Record the patient's initial score and the patient's pain goal.   Pain: 3  Pain Goal: 8 [facility] wants you to be able to say your pain was always managed very well.  Casimer Lanius 08/12/2016

## 2016-08-12 NOTE — Progress Notes (Addendum)
Patient ID: Chelsea Day, female   DOB: Feb 13, 1996, 21 y.o.   MRN: [id number] Pt still with headache BP 126/71   Pulse 86   Temp 98.3 F (36.8 C) (Oral)   Resp 18   Ht 5' 10.5" (1.791 m)   Wt 277 lb (125.6 kg)   LMP 11/28/2015   SpO2 98%   BMI 39.18 kg/m  Cat 1 with toco q 2-5 min cx 1 cm per RN Labs WNL will recheck at 7:00pm Pt received second cytotec Stadol nasal spray and oxygen for headache.  CT was neg  On report  I was unable to find a head CT.  The pt stated she never had one.  Because she has a headache that  Has not been relieved with narcotics will get head CT

## 2016-08-12 NOTE — Progress Notes (Signed)
Admission nutrition screen triggered for unintentional weight loss > 10 lbs within the last month. PNR indicates no weight loss and an overall weight gain of 46 lbs. Patients chart reviewed and assessed  for nutritional risk. Patient is determined to be at low nutrition  risk.   Chelsea DayFredderick Severance LDN Neonatal Nutrition Support Specialist/RD III Pager [phone]      Phone [phone]

## 2016-08-12 NOTE — Progress Notes (Signed)
Antepartum LOS: 1 Chelsea Day, 21 y.o.,   OB History    Gravida Para Term Preterm AB Living   1             SAB TAB Ectopic Multiple Live Births                  Subjective -Patient reports continued headache despite oxycodone and Reglan dosing.  Patient does admit to improvement, but not significant.  She also reports worsening of visual symptoms.  Informed of need for induction of labor.  Objective  Vitals:   08/11/16 1943 08/11/16 2100 08/11/16 2246 08/12/16 0011  BP: 138/65  (!) 162/70 125/68  Pulse: (!) 112  95 86  Resp: 18   16  Temp: 99 F (37.2 C)   98 F (36.7 C)  TempSrc: Oral   Oral  SpO2: 100%   98%  Weight:  125.6 kg (277 lb)    Height:  5' 10.5" (1.791 m)      Results for orders placed or performed during the hospital encounter of 08/11/16 (from the past 24 hour(s))  Protein / creatinine ratio, urine     Status: None   Collection Time: 08/11/16  1:38 PM  Result Value Ref Range   Creatinine, Urine 59.00 mg/dL   Total Protein, Urine <6 mg/dL   Protein Creatinine Ratio        0.00 - 0.15 mg/mg[Cre]  CBC     Status: Abnormal   Collection Time: 08/11/16  2:45 PM  Result Value Ref Range   WBC 15.9 (H) 4.0 - 10.5 K/uL   RBC 4.35 3.87 - 5.11 MIL/uL   Hemoglobin 12.6 12.0 - 15.0 g/dL   HCT 37.1 36.0 - 46.0 %   MCV 85.3 78.0 - 100.0 fL   MCH 29.0 26.0 - 34.0 pg   MCHC 34.0 30.0 - 36.0 g/dL   RDW 14.2 11.5 - 15.5 %   Platelets 310 150 - 400 K/uL  Comprehensive metabolic panel     Status: Abnormal   Collection Time: 08/11/16  2:45 PM  Result Value Ref Range   Sodium 135 135 - 145 mmol/L   Potassium 3.4 (L) 3.5 - 5.1 mmol/L   Chloride 106 101 - 111 mmol/L   CO2 22 22 - 32 mmol/L   Glucose, Bld 91 65 - 99 mg/dL   BUN 8 6 - 20 mg/dL   Creatinine, Ser 0.52 0.44 - 1.00 mg/dL   Calcium 9.1 8.9 - 10.3 mg/dL   Total Protein 7.4 6.5 - 8.1 g/dL   Albumin 3.2 (L) 3.5 - 5.0 g/dL   AST 16 15 - 41 U/L   ALT 17 14 - 54 U/L   Alkaline Phosphatase 151 (H) 38 - 126  U/L   Total Bilirubin 0.1 (L) 0.3 - 1.2 mg/dL   GFR calc non Af Amer >60 >60 mL/min   GFR calc Af Amer >60 >60 mL/min   Anion gap 7 5 - 15  Type and screen [place]     Status: None   Collection Time: 08/11/16  2:45 PM  Result Value Ref Range   ABO/RH(D) A POS    Antibody Screen NEG    Sample Expiration 08/14/2016     Meds: Scheduled Meds: . docusate sodium  100 mg Oral Daily  . famotidine  20 mg Oral Q12H  . prenatal multivitamin  1 tablet Oral Q1200   Continuous Infusions: . lactated ringers 75 mL/hr at 08/11/16 1918   PRN  Meds:.butalbital-acetaminophen-caffeine, calcium carbonate, diphenhydrAMINE, metoCLOPramide (REGLAN) injection, oxyCODONE, zolpidem   Physical Exam  Constitutional: She appears well-developed and well-nourished. She appears distressed.  HENT:  Head: Normocephalic and atraumatic.  Eyes: Conjunctivae are normal.  Neck: Normal range of motion.  Cardiovascular: Normal rate, regular rhythm and normal heart sounds.   Pulmonary/Chest: Effort normal and breath sounds normal.  Abdominal: Soft. Bowel sounds are normal.  Vitals reviewed. :   Monitoring Type: NST Time:2139-2245 FHR: 135 bpm, Mod Var, -Decels, +Accels UC: Irregular  Assessment IUP at [redacted]w[redacted]d PreEclampsia with Severe Features No improvement in HA Visual Symptoms worsening GBS Positive  Plan -Discussed r/b of induction including fetal distress, serial induction, pain, and increased risk of c/s delivery -Discussed induction methods including cervical ripening agents, foley bulbs, and pitocin -Patient verbalizes understanding and wishes to proceed with induction process -Discussed need for MgSO4 Bolus and Infusion  Transfer to BS for IOL Plan to start induction with cytotec  MgSO4 4 bolus/2 continuous infusion Start PCN when in active labor  PIH labs in [place], MSN, CNM 08/12/2016, 12:47 AM

## 2016-08-13 ENCOUNTER — Inpatient Hospital Stay (HOSPITAL_COMMUNITY): Payer: BLUE CROSS/BLUE SHIELD | Admitting: Anesthesiology

## 2016-08-13 ENCOUNTER — Encounter (HOSPITAL_COMMUNITY): Payer: Self-pay | Admitting: Family Medicine

## 2016-08-13 LAB — CBC
HCT: 32.8 % — ABNORMAL LOW (ref 36.0–46.0)
HCT: 35.7 % — ABNORMAL LOW (ref 36.0–46.0)
HCT: 30.1 % — ABNORMAL LOW (ref 36.0–46.0)
HEMATOCRIT: 31.3 % — AB (ref 36.0–46.0)
HEMOGLOBIN: 12.2 g/dL (ref 12.0–15.0)
HEMOGLOBIN: 10.6 g/dL — AB (ref 12.0–15.0)
Hemoglobin: 11.5 g/dL — ABNORMAL LOW (ref 12.0–15.0)
Hemoglobin: 10.2 g/dL — ABNORMAL LOW (ref 12.0–15.0)
MCH: 29.5 pg (ref 26.0–34.0)
MCH: 29.2 pg (ref 26.0–34.0)
MCH: 29.1 pg (ref 26.0–34.0)
MCH: 29 pg (ref 26.0–34.0)
MCHC: 35.1 g/dL (ref 30.0–36.0)
MCHC: 34.2 g/dL (ref 30.0–36.0)
MCHC: 33.9 g/dL (ref 30.0–36.0)
MCHC: 33.9 g/dL (ref 30.0–36.0)
MCV: 84.1 fL (ref 78.0–100.0)
MCV: 85.4 fL (ref 78.0–100.0)
MCV: 85.8 fL (ref 78.0–100.0)
MCV: 85.8 fL (ref 78.0–100.0)
PLATELETS: 289 10*3/uL (ref 150–400)
PLATELETS: 271 10*3/uL (ref 150–400)
Platelets: 318 10*3/uL (ref 150–400)
Platelets: 292 10*3/uL (ref 150–400)
RBC: 3.9 MIL/uL (ref 3.87–5.11)
RBC: 4.18 MIL/uL (ref 3.87–5.11)
RBC: 3.51 MIL/uL — ABNORMAL LOW (ref 3.87–5.11)
RBC: 3.65 MIL/uL — ABNORMAL LOW (ref 3.87–5.11)
RDW: 14.6 % (ref 11.5–15.5)
RDW: 14.5 % (ref 11.5–15.5)
RDW: 14.5 % (ref 11.5–15.5)
RDW: 14.4 % (ref 11.5–15.5)
WBC: 20 10*3/uL — AB (ref 4.0–10.5)
WBC: 23.5 10*3/uL — ABNORMAL HIGH (ref 4.0–10.5)
WBC: 18.8 10*3/uL — ABNORMAL HIGH (ref 4.0–10.5)
WBC: 28.3 10*3/uL — ABNORMAL HIGH (ref 4.0–10.5)

## 2016-08-13 LAB — LACTATE DEHYDROGENASE: LDH: 224 U/L — ABNORMAL HIGH (ref 98–192)

## 2016-08-13 LAB — COMPREHENSIVE METABOLIC PANEL
ALBUMIN: 2.7 g/dL — AB (ref 3.5–5.0)
ALK PHOS: 118 U/L (ref 38–126)
ALT: 14 U/L (ref 14–54)
ANION GAP: 8 (ref 5–15)
AST: 21 U/L (ref 15–41)
BUN: 7 mg/dL (ref 6–20)
CALCIUM: 7.5 mg/dL — AB (ref 8.9–10.3)
CO2: 23 mmol/L (ref 22–32)
CREATININE: 0.54 mg/dL (ref 0.44–1.00)
Chloride: 102 mmol/L (ref 101–111)
GFR calc non Af Amer: >60 mL/min (ref >60)
GLUCOSE: 110 mg/dL — AB (ref 65–99)
Potassium: 3.4 mmol/L — ABNORMAL LOW (ref 3.5–5.1)
Sodium: 133 mmol/L — ABNORMAL LOW (ref 135–145)
TOTAL PROTEIN: 6.1 g/dL — AB (ref 6.5–8.1)
Total Bilirubin: 0.4 mg/dL (ref 0.3–1.2)

## 2016-08-13 LAB — URIC ACID: Uric Acid, Serum: 4.8 mg/dL (ref 2.3–6.6)

## 2016-08-13 MED ORDER — LACTATED RINGERS IV SOLN: 500.0000 mL | Freq: Once | INTRAVENOUS | Status: DC

## 2016-08-13 MED ORDER — BENZOCAINE-MENTHOL 20-0.5 % EX AERO: 1.0000 "application " | INHALATION_SPRAY | CUTANEOUS | Status: DC | PRN

## 2016-08-13 MED ORDER — PHENYLEPHRINE 40 MCG/ML (10ML) SYRINGE FOR IV PUSH (FOR BLOOD PRESSURE SUPPORT): 80.0000 ug | PREFILLED_SYRINGE | INTRAVENOUS | Status: DC | PRN

## 2016-08-13 MED ORDER — MEASLES, MUMPS & RUBELLA VAC ~~LOC~~ INJ: 0.5000 mL | INJECTION | Freq: Once | SUBCUTANEOUS | Status: DC

## 2016-08-13 MED ORDER — DIPHENOXYLATE-ATROPINE 2.5-0.025 MG PO TABS
2.0000 | ORAL_TABLET | Freq: Four times a day (QID) | ORAL | Status: DC | PRN
  Administered 2016-08-13: 2 via ORAL
  Filled 2016-08-13: qty 2

## 2016-08-13 MED ORDER — FENTANYL 2.5 MCG/ML BUPIVACAINE 1/10 % EPIDURAL INFUSION (WH - ANES): 14.0000 mL/h | INTRAMUSCULAR | Status: DC | PRN

## 2016-08-13 MED ORDER — TERBUTALINE SULFATE 1 MG/ML IJ SOLN: 0.2500 mg | Freq: Once | INTRAMUSCULAR | Status: DC | PRN

## 2016-08-13 MED ORDER — CARBOPROST TROMETHAMINE 250 MCG/ML IM SOLN: 250.0000 ug | Freq: Once | INTRAMUSCULAR | Status: DC

## 2016-08-13 MED ORDER — WITCH HAZEL-GLYCERIN EX PADS
1.0000 | MEDICATED_PAD | CUTANEOUS | Status: DC | PRN
Start: 2016-08-13 — End: 2016-08-15

## 2016-08-13 MED ORDER — MISOPROSTOL 200 MCG PO TABS
ORAL_TABLET | ORAL | Status: AC
  Filled 2016-08-13: qty 5

## 2016-08-13 MED ORDER — COCONUT OIL OIL
1.0000 "application " | TOPICAL_OIL | Status: DC | PRN
  Administered 2016-08-15: 1 via TOPICAL
  Filled 2016-08-13: qty 120

## 2016-08-13 MED ORDER — ACETAMINOPHEN 325 MG PO TABS
650.0000 mg | ORAL_TABLET | ORAL | Status: DC | PRN
  Administered 2016-08-14: 650 mg via ORAL
  Filled 2016-08-13: qty 2

## 2016-08-13 MED ORDER — ONDANSETRON HCL 4 MG PO TABS: 4.0000 mg | ORAL_TABLET | ORAL | Status: DC | PRN

## 2016-08-13 MED ORDER — PHENYLEPHRINE 40 MCG/ML (10ML) SYRINGE FOR IV PUSH (FOR BLOOD PRESSURE SUPPORT)
80.0000 ug | PREFILLED_SYRINGE | INTRAVENOUS | Status: DC | PRN
  Filled 2016-08-13: qty 10

## 2016-08-13 MED ORDER — FENTANYL 2.5 MCG/ML BUPIVACAINE 1/10 % EPIDURAL INFUSION (WH - ANES)
14.0000 mL/h | INTRAMUSCULAR | Status: DC | PRN
  Administered 2016-08-13 (×2): 14 mL/h via EPIDURAL
  Filled 2016-08-13 (×2): qty 100

## 2016-08-13 MED ORDER — EPHEDRINE 5 MG/ML INJ: 10.0000 mg | INTRAVENOUS | Status: DC | PRN

## 2016-08-13 MED ORDER — PENICILLIN G POTASSIUM 5000000 UNITS IJ SOLR
5.0000 10*6.[IU] | Freq: Once | INTRAMUSCULAR | Status: AC
  Administered 2016-08-13: 5 10*6.[IU] via INTRAVENOUS
  Filled 2016-08-13: qty 5

## 2016-08-13 MED ORDER — DIBUCAINE 1 % RE OINT: 1.0000 "application " | TOPICAL_OINTMENT | RECTAL | Status: DC | PRN

## 2016-08-13 MED ORDER — PRENATAL MULTIVITAMIN CH
1.0000 | ORAL_TABLET | Freq: Every day | ORAL | Status: DC
  Administered 2016-08-14 – 2016-08-15 (×2): 1 via ORAL
  Filled 2016-08-13 (×2): qty 1

## 2016-08-13 MED ORDER — DIPHENHYDRAMINE HCL 50 MG/ML IJ SOLN: 12.5000 mg | INTRAMUSCULAR | Status: DC | PRN

## 2016-08-13 MED ORDER — ONDANSETRON HCL 4 MG/2ML IJ SOLN: 4.0000 mg | INTRAMUSCULAR | Status: DC | PRN

## 2016-08-13 MED ORDER — ZOLPIDEM TARTRATE 5 MG PO TABS: 5.0000 mg | ORAL_TABLET | Freq: Every evening | ORAL | Status: DC | PRN

## 2016-08-13 MED ORDER — DIPHENHYDRAMINE HCL 25 MG PO CAPS: 25.0000 mg | ORAL_CAPSULE | Freq: Four times a day (QID) | ORAL | Status: DC | PRN

## 2016-08-13 MED ORDER — TETANUS-DIPHTH-ACELL PERTUSSIS 5-2.5-18.5 LF-MCG/0.5 IM SUSP: 0.5000 mL | Freq: Once | INTRAMUSCULAR | Status: DC

## 2016-08-13 MED ORDER — LACTATED RINGERS IV SOLN
500.0000 mL | Freq: Once | INTRAVENOUS | Status: AC
  Administered 2016-08-13: 500 mL via INTRAVENOUS

## 2016-08-13 MED ORDER — IBUPROFEN 600 MG PO TABS
600.0000 mg | ORAL_TABLET | Freq: Four times a day (QID) | ORAL | Status: DC
Start: 2016-08-13 — End: 2016-08-15
  Administered 2016-08-13 – 2016-08-15 (×7): 600 mg via ORAL
  Filled 2016-08-13 (×7): qty 1

## 2016-08-13 MED ORDER — PENICILLIN G POT IN DEXTROSE 60000 UNIT/ML IV SOLN
3.0000 10*6.[IU] | INTRAVENOUS | Status: DC
  Administered 2016-08-13 (×3): 3 10*6.[IU] via INTRAVENOUS
  Filled 2016-08-13 (×5): qty 50

## 2016-08-13 MED ORDER — CARBOPROST TROMETHAMINE 250 MCG/ML IM SOLN
INTRAMUSCULAR | Status: AC
  Administered 2016-08-13: 250 ug
  Filled 2016-08-13: qty 1

## 2016-08-13 MED ORDER — SIMETHICONE 80 MG PO CHEW: 80.0000 mg | CHEWABLE_TABLET | ORAL | Status: DC | PRN

## 2016-08-13 MED ORDER — OXYTOCIN 40 UNITS IN LACTATED RINGERS INFUSION - SIMPLE MED
1.0000 m[IU]/min | INTRAVENOUS | Status: DC
  Administered 2016-08-13: 2 m[IU]/min via INTRAVENOUS

## 2016-08-13 MED ORDER — LIDOCAINE HCL (PF) 1 % IJ SOLN
INTRAMUSCULAR | Status: DC | PRN
  Administered 2016-08-13 (×2): 5 mL via EPIDURAL

## 2016-08-13 MED ORDER — LIDOCAINE HCL (PF) 1 % IJ SOLN: INTRAMUSCULAR | Status: DC | PRN

## 2016-08-13 MED ORDER — MISOPROSTOL 200 MCG PO TABS
1000.0000 ug | ORAL_TABLET | Freq: Once | ORAL | Status: AC
  Administered 2016-08-13: 1000 ug via RECTAL

## 2016-08-13 NOTE — Progress Notes (Signed)
Chelsea Day MRN: [id number]  Subjective: -Patient asleep, but easily awakened by light touch.  Reports resolution of headache and visual disturbances since "second nap."  Continues to deny epigastric pain, SOB, and numbness/tingling.  Questions when to obtain epidural.   Objective: BP 133/73   Pulse 84   Temp 98.1 F (36.7 C) (Oral)   Resp 20   Ht 5' 10.5" (1.791 m)   Wt 125.6 kg (277 lb)   LMP 11/28/2015   SpO2 98%   BMI 39.18 kg/m  I/O last 3 completed shifts: In: 5452.9 [P.O.:3000; I.V.:2452.9] Out: 3875 [Urine:3875] Total I/O In: 1050 [I.V.:1050] Out: 2050 [Urine:2050]   MgSO4 Infusing  Fetal Monitoring: FHT: 120 bpm, Mod Var, -Decels, +10x10 Accels UC: Irregular    Vaginal Exam: SVE:   Dilation: 4.5 Effacement (%): 50 Station: -3 Exam by:: Chelsea Day, CNM  Membranes:Intact and Bulging Internal Monitors: None  Augmentation/Induction: Pitocin:Initiated Cytotec: S/P 4 Doses Foley Bulb expelled at 0245  Assessment:  IUP at 37wks Cat I FT  IOL PreEclampsia  Plan: -Start PCN -Start pitocin titration  -Discussed appropriateness of epidural; encouraged to get when contractions regular and discomfort not relieved with IV pain medication -No other q/c -Continue other mgmt as ordered   Chelsea Day, CNM 08/13/2016, 2:59 AM

## 2016-08-13 NOTE — Anesthesia Procedure Notes (Deleted)
Epidural

## 2016-08-13 NOTE — Lactation Note (Addendum)
This note was copied from a baby's chart. Lactation Consultation Note Report via phone given to [facility], RN. Mom is settled into post partum room, reports a recent feeding of about 30 minutes and has DEBP at bedside, Rn to set up soon.  Mom already asking about pumping and aware of hand expressing to spoon feed baby after feedings. Rn to update feedings and LC will monitor for next consult.     Patient Name: Chelsea Day [id number] Date: 08/13/2016     Maternal Data    Feeding Feeding Type: Breast Fed  LATCH Score/Interventions Latch: Repeated attempts needed to sustain latch, nipple held in mouth throughout feeding, stimulation needed to elicit sucking reflex. Intervention(s): Assist with latch  Audible Swallowing: Spontaneous and intermittent Intervention(s): Hand expression  Type of Nipple: Everted at rest and after stimulation  Comfort (Breast/Nipple): Soft / non-tender     Hold (Positioning): No assistance needed to correctly position infant at breast. Intervention(s): Breastfeeding basics reviewed  LATCH Score: 9  Lactation Tools Discussed/Used     Consult Status      Chelsea Day, Justine Null 08/13/2016, 9:38 PM

## 2016-08-13 NOTE — Anesthesia Procedure Notes (Signed)
Epidural Patient location during procedure: OB Start time: 08/13/2016 9:37 AM End time: 08/13/2016 9:48 AM  Staffing Anesthesiologist: Duane Boston Performed: anesthesiologist   Preanesthetic Checklist Completed: patient identified, site marked, pre-op evaluation, timeout performed, IV checked, risks and benefits discussed and monitors and equipment checked  Epidural Patient position: sitting Prep: DuraPrep Patient monitoring: heart rate, cardiac monitor, continuous pulse ox and blood pressure Approach: midline Location: L2-L3 Injection technique: LOR saline  Needle:  Needle type: Tuohy  Needle gauge: 17 G Needle length: 9 cm Needle insertion depth: 8 cm Catheter size: 20 Guage Catheter at skin depth: 12 cm Test dose: negative and Other  Assessment Events: blood not aspirated, injection not painful, no injection resistance and negative IV test  Additional Notes Informed consent obtained prior to proceeding including risk of failure, 1% risk of PDPH, risk of minor discomfort and bruising.  Discussed rare but serious complications including epidural abscess, permanent nerve injury, epidural hematoma.  Discussed alternatives to epidural analgesia and patient desires to proceed.  Timeout performed pre-procedure verifying patient name, procedure, and platelet count.  Patient tolerated procedure well.

## 2016-08-13 NOTE — Progress Notes (Signed)
Subjective: Pt resting. Denies headache.  Denies epigastric pain. Feels occ cramping.  Support in room. Objective: BP 120/61   Pulse 88   Temp 98.4 F (36.9 C) (Oral)   Resp 20   Ht 5' 10.5" (1.791 m)   Wt 125.6 kg (277 lb)   LMP 11/28/2015   SpO2 98%   BMI 39.18 kg/m  I/O last 3 completed shifts: In: 7861.6 [P.O.:3720; I.V.:3891.6; IV Piggyback:250] Out: [id number] [Urine:7325] No intake/output data recorded.  FHT: Category 1 UC:   irregular, every 5-10  minutes SVE:   Dilation: 4 Effacement (%): 50 Station: -2 Exam by:: Dr. Pauletta Browns Pitocin at 8 mu AROM clear fluid  Assessment:  Pt is a 22yo G1P0 at 37 wks with Pre Eclampsia with severe features on Magnesium Sulfate induction Cat 1 strip GBS positive Plan: Continue with pitocin Epidural when needed.  Update MD as needed.  Starla Link CNM, MSN 08/13/2016, 7:30 AM

## 2016-08-13 NOTE — Anesthesia Preprocedure Evaluation (Addendum)
Anesthesia Evaluation  Patient identified by MRN, date of birth, ID band Patient awake    Reviewed: Allergy & Precautions, NPO status , Patient's Chart, lab work & pertinent test results  History of Anesthesia Complications Negative for: history of anesthetic complications  Airway Mallampati: III  TM Distance: >3 FB Neck ROM: Full    Dental no notable dental hx. (+) Dental Advisory Given   Pulmonary neg pulmonary ROS, former smoker,    Pulmonary exam normal        Cardiovascular hypertension, Normal cardiovascular exam     Neuro/Psych negative neurological ROS  negative psych ROS   GI/Hepatic negative GI ROS, Neg liver ROS,   Endo/Other  Morbid obesity  Renal/GU negative Renal ROS  negative genitourinary   Musculoskeletal negative musculoskeletal ROS (+)   Abdominal   Peds negative pediatric ROS (+)  Hematology negative hematology ROS (+)   Anesthesia Other Findings   Reproductive/Obstetrics negative OB ROS                             Anesthesia Physical Anesthesia Plan  ASA: II  Anesthesia Plan: Epidural   Post-op Pain Management:    Induction:   Airway Management Planned: Natural Airway  Additional Equipment:   Intra-op Plan:   Post-operative Plan:   Informed Consent: I have reviewed the patients History and Physical, chart, labs and discussed the procedure including the risks, benefits and alternatives for the proposed anesthesia with the patient or authorized representative who has indicated his/her understanding and acceptance.   Dental advisory given  Plan Discussed with: Anesthesiologist  Anesthesia Plan Comments:         Anesthesia Quick Evaluation

## 2016-08-13 NOTE — Progress Notes (Signed)
Subjective: Pt is resting after epidural.  Good pain control. Objective: BP (!) 147/70   Pulse 74   Temp 98.6 F (37 C) (Oral)   Resp 18   Ht 5' 10.5" (1.791 m)   Wt 125.6 kg (277 lb)   LMP 11/28/2015   SpO2 98%   BMI 39.18 kg/m  I/O last 3 completed shifts: In: 7861.6 [P.O.:3720; I.V.:3891.6; IV Piggyback:250] Out: [id number] [Urine:7325] No intake/output data recorded.  FHT: Category 1 UC:   regular, every 2-4 minutes SVE:   Dilation: 5.5 Effacement (%): 80 Station: -1 Exam by:: Irene Shipper, CNM Pitocin at 41mu Hard to monitor contractions so IUPC placed per RN request.  Inserted without difficulty.  Assessment:  Pt is a 21 yo G1P0 at 37 weeks IUP induction for Pre Eclampsia with severe features Cat 1 strip  Plan: Monitor progress  Starla Link CNM, MSN 08/13/2016, 10:33 AM

## 2016-08-13 NOTE — Lactation Note (Signed)
This note was copied from a baby's chart. Lactation Consultation Note  Patient Name: Chelsea Day [id number] Date: 08/13/2016 Reason for consult: Initial assessment;Late preterm infant;Other (Comment) (EBL 1400 cc)   Initial consult with first time mom of < 1 hour old infant in [place]. Mom on MgSO4 for GHTN and with 1400 cc EBL at delivery. Mom very sleepy throughout feeding and teaching. FOB and Maternal Grandparents present in room. Discussed with mom concerns of delayed or limited milk supply due to [facility] and that infant may need to be supplemented with formula if unable to to obtain colostrum/milk to feed infant. Mom was planning to exclusively BF.  LPT infant policy introduced to family since infant born at 19 weeks. Discussed following guidelines and to supplement infant with any EBM that is expressed, if unable to express colostrum may need to give formula as early as tonight.   Infant STS with mom. Assisted mom in latching infant to left breast, infant fed for 5 minutes, infant on and off and then would fall asleep. Due to mom's condition and not being able to sit up currently and very large breast it was difficult getting infant into good position. Hand expressed mom on the right breast and no colostrum visible, hand expressed left breast and obtained 5 cc Colostrum that was spoon fed to infant. Infant tolerated feeding well. Feeding log given with instructions for use.   BF Resources handout and [place] given, family informed of IP/OP Services, BF Support Groups and [place] Phone #. Enc family to call out to desk for feeding assistance as needed.   Plan reviewed with family: Breastfeed infant at least every 3 hours, awaken as needed to feed Feed infant any colostrum that is obtained via spoon Pump both breasts with DEBP on Initiate setting for 15 minutes Hand express post BF Call for assistance as needed       Maternal Data Formula Feeding for Exclusion: No Has  patient been taught Hand Expression?: Yes (will need refreshing of teaching) Does the patient have breastfeeding experience prior to this delivery?: No  Feeding Feeding Type: Breast Fed Length of feed: 10 min  LATCH Score/Interventions Latch: Repeated attempts needed to sustain latch, nipple held in mouth throughout feeding, stimulation needed to elicit sucking reflex. Intervention(s): Adjust position;Assist with latch;Breast massage;Breast compression  Audible Swallowing: A few with stimulation Intervention(s): Skin to skin;Hand expression;Alternate breast massage  Type of Nipple: Everted at rest and after stimulation (semi flat, evert with stim)  Comfort (Breast/Nipple): Soft / non-tender     Hold (Positioning): Full assist, staff holds infant at breast Intervention(s): Breastfeeding basics reviewed;Support Pillows;Position options;Skin to skin  LATCH Score: 6  Lactation Tools Discussed/Used WIC Program: No   Consult Status Consult Status: Follow-up Date: 08/14/16 Follow-up type: In-patient    Debby Freiberg Hice 08/13/2016, 5:14 PM

## 2016-08-14 LAB — CBC
HCT: 25.7 % — ABNORMAL LOW (ref 36.0–46.0)
Hemoglobin: 8.9 g/dL — ABNORMAL LOW (ref 12.0–15.0)
MCH: 29.7 pg (ref 26.0–34.0)
MCHC: 34.6 g/dL (ref 30.0–36.0)
MCV: 85.7 fL (ref 78.0–100.0)
Platelets: 237 10*3/uL (ref 150–400)
RBC: 3 MIL/uL — ABNORMAL LOW (ref 3.87–5.11)
RDW: 14.7 % (ref 11.5–15.5)
WBC: 17.7 10*3/uL — AB (ref 4.0–10.5)

## 2016-08-14 LAB — MAGNESIUM: MAGNESIUM: 3.6 mg/dL — AB (ref 1.7–2.4)

## 2016-08-14 MED ORDER — FERROUS GLUCONATE 324 (38 FE) MG PO TABS
324.0000 mg | ORAL_TABLET | Freq: Two times a day (BID) | ORAL | Status: DC
  Administered 2016-08-14 – 2016-08-15 (×3): 324 mg via ORAL
  Filled 2016-08-14 (×5): qty 1

## 2016-08-14 MED ORDER — MAGNESIUM SULFATE 50 % IJ SOLN: 2.0000 g/h | INTRAVENOUS | Status: AC

## 2016-08-14 MED ORDER — LACTATED RINGERS IV SOLN
INTRAVENOUS | Status: DC
  Administered 2016-08-14: 11:00:00 via INTRAVENOUS

## 2016-08-14 NOTE — Lactation Note (Signed)
This note was copied from a baby's chart. Lactation Consultation Note  Patient Name: Chelsea Day [id number] Date: 08/14/2016 Reason for consult: Follow-up assessment Infant is 75 hours old and seen by lactation for follow-up assessment. Baby was with visitor when [facility] entered. Mom stated BF is going very well but that baby has been more sleepy lately. Mom reports she tried using DEBP but did not get any milk but was able to do hand expression and gave this to baby via spoon if she does not feed long. Mom reports her mom breastfed for 10 years (with multiple children) so has a lot of support. Mom reports she will be going back to work at [employer] and has a pump at home. Encouraged mom to focus more on BF vs pumping for the first few weeks with the goal of BF 8-12x in 24 hrs. Mom reports no questions. Encouraged mom to ask for nurse at future feedings if help is needed/ to assess a latch.  Maternal Data    Feeding Length of feed: 10 min  LATCH Score/Interventions                      Lactation Tools Discussed/Used     Consult Status Consult Status: Follow-up Date: 08/15/16 Follow-up type: In-patient    Yvonna Alanis 08/14/2016, 5:39 PM

## 2016-08-14 NOTE — Progress Notes (Signed)
Postpartum Note Day # 1  S:  Patient resting comfortable in bed.  Pain controlled.  Tolerating general. + flatus, no BM.  Lochia moderate.  Ambulating without difficulty.  She denies n/v/f/c, SOB, or CP.  Pt plans on breastfeeding.  O: Temp:  [98.1 F (36.7 C)-99.1 F (37.3 C)] 99 F (37.2 C) (01/13 0400) Pulse Rate:  [69-117] 90 (01/13 0400) Resp:  [18-20] 18 (01/13 0400) BP: (119-169)/(47-97) 119/47 (01/13 0400) SpO2:  [97 %-100 %] 97 % (01/13 0400)   Gen: A&Ox3, NAD CV: RRR Resp: CTAB Abdomen: obse, soft, NT, ND Uterus: firm, non-tender, below umbilicus Ext: 1+ edema, no calf tenderness bilaterally, no clonus  Labs:  CBC Latest Ref Rng & Units 08/14/2016 08/13/2016 08/13/2016  WBC 4.0 - 10.5 K/uL 17.7(H) 28.3(H) 18.8(H)  Hemoglobin 12.0 - 15.0 g/dL 8.9(L) 10.6(L) 10.2(L)  Hematocrit 36.0 - 46.0 % 25.7(L) 31.3(L) 30.1(L)  Platelets 150 - 400 K/uL 237 292 271    A/P: Pt is a 21 y.o. G1P1001 s/p NSVD, POD#1 1) Preeclampsia with severe features -continue Mag x 24hr postpartum -no IV or oral BP medications needed -asymptomatic, will continue to closely monitor -UOP, foley in place, will remove s/p Magnesium  2) Postpartum hemorrhage -EBL: 1300cc, s/p Cytotec and Hemabate -Lochia appropriate, Hgb as above, pt asymptomatic -iron twice daily  3) Postpartum care - Pain well controlled -GU: UOP is adequate -GI: Tolerating general diet -Activity: encouraged sitting up to chair and ambulation as tolerated -Prophylaxis: early ambulation -_0  MMR postpartum  DISPO: Continue with routine postpartum care, plan to stop Magensium and remove foley around 1500 today then transfer to routine postpartum room.  Janyth Pupa, DO [phone] (pager) [phone] (office)

## 2016-08-14 NOTE — Anesthesia Postprocedure Evaluation (Addendum)
Anesthesia Post Note  Patient: Aajah Uhde  Procedure(s) Performed: * No procedures listed *  Patient location during evaluation: [facility] Anesthesia Type: Epidural Level of consciousness: awake and alert Pain management: pain level controlled Vital Signs Assessment: post-procedure vital signs reviewed and stable Respiratory status: spontaneous breathing, nonlabored ventilation and respiratory function stable Cardiovascular status: stable Postop Assessment: no headache, no backache and epidural receding Anesthetic complications: no        Last Vitals:  Vitals:   08/14/16 0000 08/14/16 0400  BP: (!) 146/64 (!) 119/47  Pulse: 96 90  Resp: 18 18  Temp: 37.3 C 37.2 C    Last Pain:  Vitals:   08/14/16 0400  TempSrc: Oral  PainSc:    Pain Goal: Patients Stated Pain Goal: 3 (08/12/16 0730)               Gilmer Mor

## 2016-08-15 MED ORDER — IBUPROFEN 600 MG PO TABS: 600.0000 mg | ORAL_TABLET | Freq: Four times a day (QID) | ORAL | 2 refills | Status: DC | PRN

## 2016-08-15 MED ORDER — FERROUS GLUCONATE 324 (38 FE) MG PO TABS: 324.0000 mg | ORAL_TABLET | Freq: Two times a day (BID) | ORAL | 3 refills | Status: DC

## 2016-08-15 NOTE — Discharge Instructions (Signed)
Postpartum Care After Vaginal Delivery °The period of time right after you deliver your newborn is called the postpartum period. °What kind of medical care will I receive? °· You may continue to receive fluids and medicines through an IV tube inserted into one of your veins. °· If an incision was made near your vagina (episiotomy) or if you had some vaginal tearing during delivery, cold compresses may be placed on your episiotomy or your tear. This helps to reduce pain and swelling. °· You may be given a squirt bottle to use when you go to the bathroom. You may use this until you are comfortable wiping as usual. To use the squirt bottle, follow these steps: °¨ Before you urinate, fill the squirt bottle with warm water. Do not use hot water. °¨ After you urinate, while you are sitting on the toilet, use the squirt bottle to rinse the area around your urethra and vaginal opening. This rinses away any urine and blood. °¨ You may do this instead of wiping. As you start healing, you may use the squirt bottle before wiping yourself. Make sure to wipe gently. °¨ Fill the squirt bottle with clean water every time you use the bathroom. °· You will be given sanitary pads to wear. °How can I expect to feel? °· You may not feel the need to urinate for several hours after delivery. °· You will have some soreness and pain in your abdomen and vagina. °· If you are breastfeeding, you may have uterine contractions every time you breastfeed for up to several weeks postpartum. Uterine contractions help your uterus return to its normal size. °· It is normal to have vaginal bleeding (lochia) after delivery. The amount and appearance of lochia is often similar to a menstrual period in the first week after delivery. It will gradually decrease over the next few weeks to a dry, yellow-brown discharge. For most women, lochia stops completely by 6-8 weeks after delivery. Vaginal bleeding can vary from woman to woman. °· Within the first few  days after delivery, you may have breast engorgement. This is when your breasts feel heavy, full, and uncomfortable. Your breasts may also throb and feel hard, tightly stretched, warm, and tender. After this occurs, you may have milk leaking from your breasts. Your health care provider can help you relieve discomfort due to breast engorgement. Breast engorgement should go away within a few days. °· You may feel more sad or worried than normal due to hormonal changes after delivery. These feelings should not last more than a few days. If these feelings do not go away after several days, speak with your health care provider. °How should I care for myself? °· Tell your health care provider if you have pain or discomfort. °· Drink enough water to keep your urine clear or pale yellow. °· Wash your hands thoroughly with soap and water for at least 20 seconds after changing your sanitary pads, after using the toilet, and before holding or feeding your baby. °· If you are not breastfeeding, avoid touching your breasts a lot. Doing this can make your breasts produce more milk. °· If you become weak or lightheaded, or you feel like you might faint, ask for help before: °¨ Getting out of bed. °¨ Showering. °· Change your sanitary pads frequently. Watch for any changes in your flow, such as a sudden increase in volume, a change in color, the passing of large blood clots. If you pass a blood clot from your vagina, save it   to show to your health care provider. Do not flush blood clots down the toilet without having your health care provider look at them. °· Make sure that all your vaccinations are up to date. This can help protect you and your baby from getting certain diseases. You may need to have immunizations done before you leave the hospital. °· If desired, talk with your health care provider about methods of family planning or birth control (contraception). °How can I start bonding with my baby? °Spending as much time as  possible with your baby is very important. During this time, you and your baby can get to know each other and develop a bond. Having your baby stay with you in your room (rooming in) can give you time to get to know your baby. Rooming in can also help you become comfortable caring for your baby. Breastfeeding can also help you bond with your baby. °How can I plan for returning home with my baby? °· Make sure that you have a car seat installed in your vehicle. °¨ Your car seat should be checked by a certified car seat installer to make sure that it is installed safely. °¨ Make sure that your baby fits into the car seat safely. °· Ask your health care provider any questions you have about caring for yourself or your baby. Make sure that you are able to contact your health care provider with any questions after leaving the hospital. °This information is not intended to replace advice given to you by your health care provider. Make sure you discuss any questions you have with your health care provider. °Document Released: 05/16/2007 Document Revised: 12/22/2015 Document Reviewed: 06/23/2015 °Elsevier Interactive Patient Education © 2017 [employer] °Postpartum Depression and Baby Blues °The postpartum period begins right after the birth of a baby. During this time, there is often a great amount of joy and excitement. It is also a time of many changes in the life of the parents. Regardless of how many times a mother gives birth, each child brings new challenges and dynamics to the family. It is not unusual to have feelings of excitement along with confusing shifts in moods, emotions, and thoughts. All mothers are at risk of developing postpartum depression or the "baby blues." These mood changes can occur right after giving birth, or they may occur many months after giving birth. The baby blues or postpartum depression can be mild or severe. Additionally, postpartum depression can go away rather quickly, or it can be a  long-term condition. °What are the causes? °Raised hormone levels and the rapid drop in those levels are thought to be a main cause of postpartum depression and the baby blues. A number of hormones change during and after pregnancy. Estrogen and progesterone usually decrease right after the delivery of your baby. The levels of thyroid hormone and various cortisol steroids also rapidly drop. Other factors that play a role in these mood changes include major life events and genetics. °What increases the risk? °If you have any of the following risks for the baby blues or postpartum depression, know what symptoms to watch out for during the postpartum period. Risk factors that may increase the likelihood of getting the baby blues or postpartum depression include: °· Having a personal or family history of depression. °· Having depression while being pregnant. °· Having premenstrual mood issues or mood issues related to oral contraceptives. °· Having a lot of life stress. °· Having marital conflict. °· Lacking a social support network. °·   Having a baby with special needs.  Having health problems, such as diabetes. What are the signs or symptoms? Symptoms of baby blues include:  Brief changes in mood, such as going from extreme happiness to sadness.  Decreased concentration.  Difficulty sleeping.  Crying spells, tearfulness.  Irritability.  Anxiety. Symptoms of postpartum depression typically begin within the first month after giving birth. These symptoms include:  Difficulty sleeping or excessive sleepiness.  Marked weight loss.  Agitation.  Feelings of worthlessness.  Lack of interest in activity or food. Postpartum psychosis is a very serious condition and can be dangerous. Fortunately, it is rare. Displaying any of the following symptoms is cause for immediate medical attention. Symptoms of postpartum psychosis include:  Hallucinations and delusions.  Bizarre or disorganized  behavior.  Confusion or disorientation. How is this diagnosed? A diagnosis is made by an evaluation of your symptoms. There are no medical or lab tests that lead to a diagnosis, but there are various questionnaires that a health care provider may use to identify those with the baby blues, postpartum depression, or psychosis. Often, a screening tool called the [place] Postnatal Depression Scale is used to diagnose depression in the postpartum period. How is this treated? The baby blues usually goes away on its own in 1-2 weeks. Social support is often all that is needed. You will be encouraged to get adequate sleep and rest. Occasionally, you may be given medicines to help you sleep. Postpartum depression requires treatment because it can last several months or longer if it is not treated. Treatment may include individual or group therapy, medicine, or both to address any social, physiological, and psychological factors that may play a role in the depression. Regular exercise, a healthy diet, rest, and social support may also be strongly recommended. Postpartum psychosis is more serious and needs treatment right away. Hospitalization is often needed. Follow these instructions at home:  Get as much rest as you can. Nap when the baby sleeps.  Exercise regularly. Some women find yoga and walking to be beneficial.  Eat a balanced and nourishing diet.  Do little things that you enjoy. Have a cup of tea, take a bubble bath, read your favorite magazine, or listen to your favorite music.  Avoid alcohol.  Ask for help with household chores, cooking, grocery shopping, or running errands as needed. Do not try to do everything.  Talk to people close to you about how you are feeling. Get support from your partner, family members, friends, or other new moms.  Try to stay positive in how you think. Think about the things you are grateful for.  Do not spend a lot of time alone.  Only take  over-the-counter or prescription medicine as directed by your health care provider.  Keep all your postpartum appointments.  Let your health care provider know if you have any concerns. Contact a health care provider if: You are having a reaction to or problems with your medicine. Get help right away if:  You have suicidal feelings.  You think you may harm the baby or someone else. This information is not intended to replace advice given to you by your health care provider. Make sure you discuss any questions you have with your health care provider. Document Released: 04/22/2004 Document Revised: 12/25/2015 Document Reviewed: 04/30/2013 Elsevier Interactive Patient Education  2017 [place]. Iron-Rich Diet Introduction Iron is a mineral that helps your body to produce hemoglobin. Hemoglobin is a protein in your red blood cells that carries oxygen  to your body's tissues. Eating too little iron may cause you to feel weak and tired, and it can increase your risk for infection. Eating enough iron is necessary for your body's metabolism, muscle function, and nervous system. °Iron is naturally found in many foods. It can also be added to foods or fortified in foods. There are two types of dietary iron: °· Heme iron. Heme iron is absorbed by the body more easily than nonheme iron. Heme iron is found in meat, poultry, and fish. °· Nonheme iron. Nonheme iron is found in dietary supplements, iron-fortified grains, beans, and vegetables. °You may need to follow an iron-rich diet if: °· You have been diagnosed with iron deficiency or iron-deficiency anemia. °· You have a condition that prevents you from absorbing dietary iron, such as: °¨ Infection in your intestines. °¨ Celiac disease. This involves long-lasting (chronic) inflammation of your intestines. °· You do not eat enough iron. °· You eat a diet that is high in foods that impair iron absorption. °· You have lost a lot of blood. °· You have heavy  bleeding during your menstrual cycle. °· You are pregnant. °What is my plan? °Your health care provider may help you to determine how much iron you need per day based on your condition. Generally, when a person consumes sufficient amounts of iron in the diet, the following iron needs are met: °· Men. °¨ 14-18 years old: 11 mg per day. °¨ 19-50 years old: 8 mg per day. °· Women. °¨ 14-18 years old: 15 mg per day. °¨ 19-50 years old: 18 mg per day. °¨ Over 50 years old: 8 mg per day. °¨ Pregnant women: 27 mg per day. °¨ Breastfeeding women: 9 mg per day. °What do I need to know about an iron-rich diet? °· Eat fresh fruits and vegetables that are high in vitamin C along with foods that are high in iron. This will help increase the amount of iron that your body absorbs from food, especially with foods containing nonheme iron. Foods that are high in vitamin C include oranges, peppers, tomatoes, and mango. °· Take iron supplements only as directed by your health care provider. Overdose of iron can be life-threatening. If you were prescribed iron supplements, take them with orange juice or a vitamin C supplement. °· Cook foods in pots and pans that are made from iron. °· Eat nonheme iron-containing foods alongside foods that are high in heme iron. This helps to improve your iron absorption. °· Certain foods and drinks contain compounds that impair iron absorption. Avoid eating these foods in the same meal as iron-rich foods or with iron supplements. These include: °¨ Coffee, black tea, and red wine. °¨ Milk, dairy products, and foods that are high in calcium. °¨ Beans, soybeans, and peas. °¨ Whole grains. °· When eating foods that contain both nonheme iron and compounds that impair iron absorption, follow these tips to absorb iron better. °¨ Soak beans overnight before cooking. °¨ Soak whole grains overnight and drain them before using. °¨ Ferment flours before baking, such as using yeast in bread dough. °What foods can I  eat? °Grains  °Iron-fortified breakfast cereal. Iron-fortified whole-wheat bread. Enriched rice. Sprouted grains. °Vegetables  °Spinach. Potatoes with skin. Green peas. Broccoli. Red and green bell peppers. Fermented vegetables. °Fruits  °Prunes. Raisins. Oranges. Strawberries. Mango. Grapefruit. °Meats and Other Protein Sources  °Beef liver. Oysters. Beef. Shrimp. Turkey. Chicken. Tuna. Sardines. Chickpeas. Nuts. Tofu. °Beverages  °Tomato juice. Fresh orange juice. Prune juice. Hibiscus tea.   Fortified instant breakfast shakes. °Condiments  °Tahini. Fermented soy sauce. °Sweets and Desserts  °Black-strap molasses. °Other  °Wheat germ. °The items listed above may not be a complete list of recommended foods or beverages. Contact your dietitian for more options.  °What foods are not recommended? °Grains  °Whole grains. Bran cereal. Bran flour. Oats. °Vegetables  °Artichokes. Brussels sprouts. Kale. °Fruits  °Blueberries. Raspberries. Strawberries. Figs. °Meats and Other Protein Sources  °Soybeans. Products made from soy protein. °Dairy  °Milk. Cream. Cheese. Yogurt. Cottage cheese. °Beverages  °Coffee. Black tea. Red wine. °Sweets and Desserts  °Cocoa. Chocolate. Ice cream. °Other  °Basil. Oregano. Parsley. °The items listed above may not be a complete list of foods and beverages to avoid. Contact your dietitian for more information.  °This information is not intended to replace advice given to you by your health care provider. Make sure you discuss any questions you have with your health care provider. °Document Released: 03/02/2005 Document Revised: 02/06/2016 Document Reviewed: 02/13/2014 °© 2017 Elsevier ° °

## 2016-08-15 NOTE — Lactation Note (Signed)
This note was copied from a baby's chart. Lactation Consultation Note  Mother reports that BF is going well on the left breast but that she can not latch  Baby to the right side.  Right nipple and areola are edematous and non-compressible.  Assisted with massage and pre-pumping with some improvement but the baby still could not latch.  Applied #24NS to the right nipple. After tongue exercises and jaw massage baby latched easily and suckled well with audible swallows. Mother reported little to no pain. Attempted to latch again without as the area had softened but again was painful so NS reapplied. If use continues mother to post pump 6 times in 24 hours and to schedule and OP appointment Informed of support groups and outpatient services. Mother is able to apply NS. Cleaning instructions given. Patient Name: Chelsea Day [id number] Date: 08/15/2016 Reason for consult: Follow-up assessment   Maternal Data    Feeding Feeding Type: Breast Fed Length of feed: 35 min  LATCH Score/Interventions Latch: Grasps breast easily, tongue down, lips flanged, rhythmical sucking. (NS)  Audible Swallowing: A few with stimulation (Many with stim)  Type of Nipple: Everted at rest and after stimulation (center dimple)  Comfort (Breast/Nipple): Filling, red/small blisters or bruises, mild/mod discomfort (better with NS and deeper latch)     Hold (Positioning): Assistance needed to correctly position infant at breast and maintain latch.  LATCH Score: 7  Lactation Tools Discussed/Used     Consult Status Consult Status: Follow-up Follow-up type: Out-patient    Van Clines 08/15/2016, 10:19 AM

## 2016-08-15 NOTE — Discharge Summary (Signed)
OB Discharge Summary     Patient Name: Chelsea Day DOB: 05-Jan-1996 MRN: [id number]  Date of admission: 08/11/2016 Delivering MD: Starla Link   Date of discharge: 08/15/2016  Admitting diagnosis: 65.5WKS PIH, HEADACHE Intrauterine pregnancy: [redacted]w[redacted]d     Secondary diagnosis:  Principal Problem:   Vaginal delivery Active Problems:   Hypertension in pregnancy, preeclampsia, severe, third trimester   Group beta Strep positive   History of right salpingo-oophorectomy   Need for vaccination for rubella   Postpartum hemorrhage, third stage, postpartum condition  Additional problems: None     Discharge diagnosis: Term Pregnancy Delivered and Anemia                                                                                                Post partum procedures:NOne  Augmentation: None-IOL  Complications: [id number]  Hospital course:  Induction of Labor With Vaginal Delivery   21 y.o. yo G1P1001 at [redacted]w[redacted]d was admitted to the hospital 08/11/2016 for induction of labor.  Indication for induction: Preeclampsia.  Patient had an uncomplicated labor course as follows: Membrane Rupture Time/Date: 7:19 AM ,08/13/2016   Intrapartum Procedures: Episiotomy: None [1]                                         Lacerations:  1st degree [2];Perineal [11]  Patient had delivery of a Viable infant.  Information for the patient's newborn:  Debony, Swee Girl Larona [id number]  Delivery Method: Vag-Spont   08/13/2016  Details of delivery can be found in separate delivery note.  Patient had a routine postpartum course. Patient is discharged home 08/15/16.   Physical exam Vitals:   08/14/16 2010 08/15/16 0050 08/15/16 0350 08/15/16 0859  BP: 127/62 115/64 (!) 113/59 (!) 138/53  Pulse: 74 70 71 72  Resp: 16 18 18 18   Temp: 98.6 F (37 C) 98.4 F (36.9 C) 100.1 F (37.8 C) 98.1 F (36.7 C)  TempSrc: Oral Oral Oral Oral  SpO2: 96% 99% 100% 98%  Weight:      Height:        General: alert, cooperative and no distress  Chest: HRRR, Lungs CTA Abdomen: Soft, NT, BS x 4Q Lochia: appropriate Uterine Fundus: firm, U/-1 Incision: N/A Skin: Warm, Dry DVT Evaluation: Calf/Ankle edema is present-+2 Pitting   Labs: Lab Results  Component Value Date   WBC 17.7 (H) 08/14/2016   HGB 8.9 (L) 08/14/2016   HCT 25.7 (L) 08/14/2016   MCV 85.7 08/14/2016   PLT 237 08/14/2016   CMP Latest Ref Rng & Units 08/13/2016  Glucose 65 - 99 mg/dL 110(H)  BUN 6 - 20 mg/dL 7  Creatinine 0.44 - 1.00 mg/dL 0.54  Sodium 135 - 145 mmol/L 133(L)  Potassium 3.5 - 5.1 mmol/L 3.4(L)  Chloride 101 - 111 mmol/L 102  CO2 22 - 32 mmol/L 23  Calcium 8.9 - 10.3 mg/dL 7.5(L)  Total Protein 6.5 - 8.1 g/dL 6.1(L)  Total Bilirubin 0.3 - 1.2 mg/dL 0.4  Alkaline Phos 38 -  126 U/L 118  AST 15 - 41 U/L 21  ALT 14 - 54 U/L 14    Discharge instruction: per After Visit Summary and "Baby and Me Booklet". Pain Management, Peri-Care, Breastfeeding, Who and When to call for postpartum complications. Information Sheet(s) given PPD and BB, Iron Rich Diet, Care after Vaginal Delivery.   After visit meds:  Allergies as of 08/15/2016      Reactions   Peanuts [peanut Oil] Anaphylaxis, Hives   Allergic to all nuts   Latex Rash      Medication List    STOP taking these medications   acetaminophen 325 MG tablet Commonly known as:  TYLENOL   clove oil liquid   prenatal multivitamin Tabs tablet     TAKE these medications   ferrous gluconate 324 MG tablet Commonly known as:  FERGON Take 1 tablet (324 mg total) by mouth 2 (two) times daily with a meal. For 14 days, then once daily for 28 days.   ibuprofen 600 MG tablet Commonly known as:  ADVIL,MOTRIN Take 1 tablet (600 mg total) by mouth every 6 (six) hours as needed.   valACYclovir 500 MG tablet Commonly known as:  VALTREX Take twice daily for 3-5 days as needed What changed:  how much to take  how to take this  when to take  this  reasons to take this  additional instructions       Diet: routine diet  Activity: Advance as tolerated. Pelvic rest for 6 weeks.   Outpatient follow up:6 weeks Follow up Appt:No future appointments. Follow up Visit: [facility] Nurse to visit in One week for BP checking and reporting.   Postpartum contraception: Condoms  Newborn Data: Live born female-Amelia Birth Weight: 6 lb 11.6 oz (3050 g) APGAR: 8, 9  Baby Feeding: Breast Disposition:home with mother   08/15/2016 Maryann Conners, CNM

## 2016-12-15 ENCOUNTER — Encounter: Payer: Self-pay | Admitting: Gynecology

## 2017-01-01 NOTE — Addendum Note (Signed)
Addendum  created 01/01/17 0808 by Duane Boston, MD   Sign clinical note

## 2017-07-22 DIAGNOSIS — R5383 Other fatigue: Secondary | ICD-10-CM | POA: Diagnosis not present

## 2017-08-08 DIAGNOSIS — A084 Viral intestinal infection, unspecified: Secondary | ICD-10-CM | POA: Diagnosis not present

## 2017-09-08 DIAGNOSIS — R040 Epistaxis: Secondary | ICD-10-CM | POA: Diagnosis not present

## 2017-09-08 DIAGNOSIS — L659 Nonscarring hair loss, unspecified: Secondary | ICD-10-CM | POA: Diagnosis not present

## 2017-09-23 DIAGNOSIS — D72829 Elevated white blood cell count, unspecified: Secondary | ICD-10-CM | POA: Diagnosis not present

## 2017-11-28 DIAGNOSIS — M79641 Pain in right hand: Secondary | ICD-10-CM | POA: Diagnosis not present

## 2017-11-28 DIAGNOSIS — M25531 Pain in right wrist: Secondary | ICD-10-CM | POA: Diagnosis not present

## 2018-05-25 DIAGNOSIS — Z131 Encounter for screening for diabetes mellitus: Secondary | ICD-10-CM | POA: Diagnosis not present

## 2018-05-25 DIAGNOSIS — R000 Tachycardia, unspecified: Secondary | ICD-10-CM | POA: Diagnosis not present

## 2018-05-25 DIAGNOSIS — R55 Syncope and collapse: Secondary | ICD-10-CM | POA: Diagnosis not present

## 2018-05-25 DIAGNOSIS — R42 Dizziness and giddiness: Secondary | ICD-10-CM | POA: Diagnosis not present

## 2018-06-13 ENCOUNTER — Inpatient Hospital Stay (HOSPITAL_COMMUNITY): Payer: 59

## 2018-06-13 ENCOUNTER — Encounter (HOSPITAL_COMMUNITY): Payer: Self-pay

## 2018-06-13 ENCOUNTER — Inpatient Hospital Stay (HOSPITAL_COMMUNITY)
Admission: AD | Admit: 2018-06-13 | Discharge: 2018-06-13 | Disposition: A | Discharge status: Home / Self Care | Payer: 59 | Source: Ambulatory Visit | Attending: Obstetrics & Gynecology | Admitting: Obstetrics & Gynecology

## 2018-06-13 DIAGNOSIS — O3680X0 Pregnancy with inconclusive fetal viability, not applicable or unspecified: Secondary | ICD-10-CM

## 2018-06-13 DIAGNOSIS — O209 Hemorrhage in early pregnancy, unspecified: Secondary | ICD-10-CM | POA: Diagnosis not present

## 2018-06-13 DIAGNOSIS — Z87891 Personal history of nicotine dependence: Secondary | ICD-10-CM | POA: Insufficient documentation

## 2018-06-13 DIAGNOSIS — Z3A01 Less than 8 weeks gestation of pregnancy: Secondary | ICD-10-CM | POA: Diagnosis not present

## 2018-06-13 DIAGNOSIS — R109 Unspecified abdominal pain: Secondary | ICD-10-CM | POA: Diagnosis present

## 2018-06-13 DIAGNOSIS — O4690 Antepartum hemorrhage, unspecified, unspecified trimester: Secondary | ICD-10-CM

## 2018-06-13 DIAGNOSIS — Z3A00 Weeks of gestation of pregnancy not specified: Secondary | ICD-10-CM | POA: Diagnosis not present

## 2018-06-13 HISTORY — DX: Gestational (pregnancy-induced) hypertension without significant proteinuria, unspecified trimester: O13.9

## 2018-06-13 LAB — URINALYSIS, ROUTINE W REFLEX MICROSCOPIC
BILIRUBIN URINE: NEGATIVE
GLUCOSE, UA: NEGATIVE mg/dL
Ketones, ur: NEGATIVE mg/dL
LEUKOCYTES UA: NEGATIVE
Nitrite: NEGATIVE
Protein, ur: NEGATIVE mg/dL
SPECIFIC GRAVITY, URINE: 1.005 (ref 1.005–1.030)
pH: 6 (ref 5.0–8.0)

## 2018-06-13 LAB — HCG, QUANTITATIVE, PREGNANCY: HCG, BETA CHAIN, QUANT, S: 60 m[IU]/mL — AB (ref <5)

## 2018-06-13 LAB — WET PREP, GENITAL
Clue Cells Wet Prep HPF POC: NONE SEEN
Sperm: NONE SEEN
TRICH WET PREP: NONE SEEN
Yeast Wet Prep HPF POC: NONE SEEN

## 2018-06-13 LAB — CBC
HCT: 42.4 % (ref 36.0–46.0)
Hemoglobin: 13.9 g/dL (ref 12.0–15.0)
MCH: 28.7 pg (ref 26.0–34.0)
MCHC: 32.8 g/dL (ref 30.0–36.0)
MCV: 87.4 fL (ref 80.0–100.0)
PLATELETS: 354 10*3/uL (ref 150–400)
RBC: 4.85 MIL/uL (ref 3.87–5.11)
RDW: 13.4 % (ref 11.5–15.5)
WBC: 13.6 10*3/uL — ABNORMAL HIGH (ref 4.0–10.5)
nRBC: 0 % (ref 0.0–0.2)

## 2018-06-13 LAB — POCT PREGNANCY, URINE: PREG TEST UR: POSITIVE — AB

## 2018-06-13 NOTE — MAU Note (Addendum)
Cramping all day and it has been getting worse throughout the day, mostly on the left side, 6/10, constant  Bleeding started this AM and has been getting darker and more in amount  LMP 04/30/18  Had + HPT on 05/29/18

## 2018-06-13 NOTE — MAU Provider Note (Addendum)
History     CSN: [id number]  Arrival date and time: 06/13/18 1500   First Provider Initiated Contact with Patient 06/13/18 1529      Chief Complaint  Patient presents with  . Abdominal Pain  . Vaginal Bleeding   G2P1001 @[redacted]w[redacted]d  by LMP here with abdominal cramping and spotting. Sx started this am. Pain is 6/10 and worse on left. Spotting is mostly when she wipes but she feels it's increasing. She has not taken anything for the pain. No recent IC. No vaginal discharge. No urinary sx.   OB History    Gravida  2   Para  1   Term  1   Preterm      AB      Living  1     SAB      TAB      Ectopic      Multiple  0   Live Births  1           Past Medical History:  Diagnosis Date  . Bilateral ovarian cysts   . Endometriosis determined by laparoscopy   . Irregular periods/menstrual cycles   . Pregnancy induced hypertension     Past Surgical History:  Procedure Laterality Date  . LAPAROSCOPIC OVARIAN CYSTECTOMY Right 01/24/2015   Procedure: LAPAROSCOPIC LYSIS OF ADHESION, LAPROSCOPIC RIGHT SALPINGOOOPHORECTOMY, BIOPSY OF RIGHT BOARD LIGAMENT; LYSIS BIOPSY OF ADHESIONS, BIOPSY OF LEFT UTERO LIGAMENT;  Surgeon: Ena Dawley, MD;  Location: [facility];  Service: Gynecology;  Laterality: Right;  . LAPAROSCOPY  11/08/2011   Procedure: LAPAROSCOPY OPERATIVE;  Surgeon: Olga Millers, MD;  Location: [facility];  Service: Gynecology;  Laterality: Right;  . OVARIAN CYST REMOVAL  11/08/2011   Procedure: OVARIAN CYSTECTOMY;  Surgeon: Olga Millers, MD;  Location: [facility];  Service: Gynecology;  Laterality: Right;  . UNILATERAL SALPINGECTOMY Right 01/24/15    Family History  Problem Relation Age of Onset  . Cancer Father        ???  . Multiple sclerosis Mother     Social History   Tobacco Use  . Smoking status: Former Smoker    Types: Cigarettes    Last attempt to quit: 11/30/2013    Years since quitting: 4.5  . Smokeless tobacco: Never Used  Substance Use Topics  .  Alcohol use: No    Alcohol/week: 0.0 standard drinks    Comment: social, not while pregnant  . Drug use: No    Allergies:  Allergies  Allergen Reactions  . Peanuts [Peanut Oil] Anaphylaxis and Hives    Allergic to all nuts  . Latex Rash    No medications prior to admission.    Review of Systems  Constitutional: Negative for fever.  Gastrointestinal: Positive for abdominal pain and nausea. Negative for constipation, diarrhea and vomiting.  Genitourinary: Positive for vaginal bleeding. Negative for dysuria and vaginal discharge.  Musculoskeletal: Positive for back pain.   Physical Exam   Blood pressure (!) 131/49, pulse 94, temperature 98.4 F (36.9 C), temperature source Oral, resp. rate 18, weight 114.8 kg, last menstrual period 04/30/2018, unknown if currently breastfeeding.  Physical Exam  Constitutional: She is oriented to person, place, and time. She appears well-developed and well-nourished. No distress.  HENT:  Head: Normocephalic and atraumatic.  Neck: Normal range of motion.  Respiratory: Effort normal. No respiratory distress.  GI: Soft. She exhibits no distension and no mass. There is no tenderness. There is no rebound and no guarding.  Genitourinary:  Genitourinary Comments: External: no  lesions or erythema Vagina: rugated, pink, moist, scant pink discharge Uterus: non enlarged, anteverted, non tender, no CMT Adnexae: no masses, no tenderness left, no tenderness right Cervix nml   Musculoskeletal: Normal range of motion.  Neurological: She is alert and oriented to person, place, and time.  Skin: Skin is warm and dry.  Psychiatric: She has a normal mood and affect.   Results for orders placed or performed during the hospital encounter of 06/13/18 (from the past 24 hour(s))  Urinalysis, Routine w reflex microscopic     Status: Abnormal   Collection Time: 06/13/18  3:11 PM  Result Value Ref Range   Color, Urine STRAW (A) YELLOW   APPearance CLEAR CLEAR    Specific Gravity, Urine 1.005 1.005 - 1.030   pH 6.0 5.0 - 8.0   Glucose, UA NEGATIVE NEGATIVE mg/dL   Hgb urine dipstick LARGE (A) NEGATIVE   Bilirubin Urine NEGATIVE NEGATIVE   Ketones, ur NEGATIVE NEGATIVE mg/dL   Protein, ur NEGATIVE NEGATIVE mg/dL   Nitrite NEGATIVE NEGATIVE   Leukocytes, UA NEGATIVE NEGATIVE   RBC / HPF 0-5 0 - 5 RBC/hpf   WBC, UA 0-5 0 - 5 WBC/hpf   Bacteria, UA RARE (A) NONE SEEN   Squamous Epithelial / LPF 0-5 0 - 5  Pregnancy, urine POC     Status: Abnormal   Collection Time: 06/13/18  3:14 PM  Result Value Ref Range   Preg Test, Ur POSITIVE (A) NEGATIVE  CBC     Status: Abnormal   Collection Time: 06/13/18  3:37 PM  Result Value Ref Range   WBC 13.6 (H) 4.0 - 10.5 K/uL   RBC 4.85 3.87 - 5.11 MIL/uL   Hemoglobin 13.9 12.0 - 15.0 g/dL   HCT 42.4 36.0 - 46.0 %   MCV 87.4 80.0 - 100.0 fL   MCH 28.7 26.0 - 34.0 pg   MCHC 32.8 30.0 - 36.0 g/dL   RDW 13.4 11.5 - 15.5 %   Platelets 354 150 - 400 K/uL   nRBC 0.0 0.0 - 0.2 %  hCG, quantitative, pregnancy     Status: Abnormal   Collection Time: 06/13/18  3:37 PM  Result Value Ref Range   hCG, Beta Chain, Quant, S 60 (H) <5 mIU/mL  Wet prep, genital     Status: Abnormal   Collection Time: 06/13/18  4:05 PM  Result Value Ref Range   Yeast Wet Prep HPF POC NONE SEEN NONE SEEN   Trich, Wet Prep NONE SEEN NONE SEEN   Clue Cells Wet Prep HPF POC NONE SEEN NONE SEEN   WBC, Wet Prep HPF POC FEW (A) NONE SEEN   Sperm NONE SEEN    MAU Course  Procedures  MDM Labs and [place] ordered and reviewed. No acute abdomen or pelvis. No IUP or adnexal mass seen on [place], findings could indicate early pregnancy, ectopic pregnancy, or failed pregnancy-discussed with pt. Will follow quant in 48 hrs. Stable for discharge home.  Assessment and Plan   1. Pregnancy, location unknown   2. Vaginal bleeding in pregnancy    Discharge home Follow up in [place] in 2 days Ectopic/return precautions  Allergies as of 06/13/2018       Reactions   Peanuts [peanut Oil] Anaphylaxis, Hives   Allergic to all nuts   Latex Rash      Medication List    You have not been prescribed any medications.    Julianne Handler, CNM 06/13/2018, 4:37 PM

## 2018-06-13 NOTE — Discharge Instructions (Signed)
Vaginal Bleeding During Pregnancy, First Trimester °A small amount of bleeding (spotting) from the vagina is common in early pregnancy. Sometimes the bleeding is normal and is not a problem, and sometimes it is a sign of something serious. Be sure to tell your doctor about any bleeding from your vagina right away. °Follow these instructions at home: °· Watch your condition for any changes. °· Follow your doctor's instructions about how active you can be. °· If you are on bed rest: °? You may need to stay in bed and only get up to use the bathroom. °? You may be allowed to do some activities. °? If you need help, make plans for someone to help you. °· Write down: °? The number of pads you use each day. °? How often you change pads. °? How soaked (saturated) your pads are. °· Do not use tampons. °· Do not douche. °· Do not have sex or orgasms until your doctor says it is okay. °· If you pass any tissue from your vagina, save the tissue so you can show it to your doctor. °· Only take medicines as told by your doctor. °· Do not take aspirin because it can make you bleed. °· Keep all follow-up visits as told by your doctor. °Contact a doctor if: °· You bleed from your vagina. °· You have cramps. °· You have labor pains. °· You have a fever that does not go away after you take medicine. °Get help right away if: °· You have very bad cramps in your back or belly (abdomen). °· You pass large clots or tissue from your vagina. °· You bleed more. °· You feel light-headed or weak. °· You pass out (faint). °· You have chills. °· You are leaking fluid or have a gush of fluid from your vagina. °· You pass out while pooping (having a bowel movement). °This information is not intended to replace advice given to you by your health care provider. Make sure you discuss any questions you have with your health care provider. °Document Released: 12/03/2013 Document Revised: 12/25/2015 Document Reviewed: 03/26/2013 °Elsevier Interactive  Patient Education © 2018 [employer] ° °

## 2018-06-13 NOTE — MAU Provider Note (Signed)
History     CSN: [id number]  Arrival date and time: 06/13/18 1500   First Provider Initiated Contact with Patient 06/13/18 1529      Chief Complaint  Patient presents with  . Abdominal Pain  . Vaginal Bleeding   HPI  Chelsea Day is a 22 year old female, G2P1001 at 6 weeks and 2 days who presents with progressively worsening abdominal cramping and vaginal bleeding. Patient states her LMP was 04/30/2018 with a +HPT on 05/29/2018. Her first prenatal visit is scheduled for this Thursday with [facility]. She reports she began with abdominal cramping and spotting this morning that has progressively worsened. Pain is a 6/10 and located on the left side of her abdomen and back. She reports she has not had to change a panty liner since this morning, but that she feels the vaginal bleeding is increasing. She endorses associated nausea. She has not tried anything for her symptoms. Is not taking any medications.   Patient has surgical history of right salpingooopherectomy with cystectomy in June 2016. This was prior to her first pregnancy. Additionally, has diagnosis of endometriosis.   Past Medical History:  Diagnosis Date  . Bilateral ovarian cysts   . Endometriosis determined by laparoscopy   . Irregular periods/menstrual cycles   . Pregnancy induced hypertension     Past Surgical History:  Procedure Laterality Date  . LAPAROSCOPIC OVARIAN CYSTECTOMY Right 01/24/2015   Procedure: LAPAROSCOPIC LYSIS OF ADHESION, LAPROSCOPIC RIGHT SALPINGOOOPHORECTOMY, BIOPSY OF RIGHT BOARD LIGAMENT; LYSIS BIOPSY OF ADHESIONS, BIOPSY OF LEFT UTERO LIGAMENT;  Surgeon: Ena Dawley, MD;  Location: [facility];  Service: Gynecology;  Laterality: Right;  . LAPAROSCOPY  11/08/2011   Procedure: LAPAROSCOPY OPERATIVE;  Surgeon: Olga Millers, MD;  Location: [facility];  Service: Gynecology;  Laterality: Right;  . OVARIAN CYST REMOVAL  11/08/2011   Procedure: OVARIAN CYSTECTOMY;  Surgeon: Olga Millers, MD;  Location:  [facility];  Service: Gynecology;  Laterality: Right;  . UNILATERAL SALPINGECTOMY Right 01/24/15    Family History  Problem Relation Age of Onset  . Cancer Father        ???  . Multiple sclerosis Mother     Social History   Tobacco Use  . Smoking status: Former Smoker    Types: Cigarettes    Last attempt to quit: 11/30/2013    Years since quitting: 4.5  . Smokeless tobacco: Never Used  Substance Use Topics  . Alcohol use: No    Alcohol/week: 0.0 standard drinks    Comment: social, not while pregnant  . Drug use: No    Allergies:  Allergies  Allergen Reactions  . Peanuts [Peanut Oil] Anaphylaxis and Hives    Allergic to all nuts  . Latex Rash    No medications prior to admission.    Review of Systems  Constitutional: Negative for chills and fever.  Respiratory: Negative for shortness of breath.   Cardiovascular: Negative for chest pain and leg swelling.  Gastrointestinal: Positive for abdominal pain (left sided, cramping) and nausea. Negative for constipation, diarrhea and vomiting.  Genitourinary: Positive for vaginal bleeding. Negative for dysuria, frequency, hematuria, urgency and vaginal discharge.  Musculoskeletal: Positive for back pain (left side, cramping).  Skin: Negative for rash.  Neurological: Negative for dizziness and light-headedness.   Physical Exam   Blood pressure (!) 131/49, pulse 94, temperature 98.4 F (36.9 C), temperature source Oral, resp. rate 18, weight 114.8 kg, last menstrual period 04/30/2018, unknown if currently breastfeeding.  Physical Exam  Constitutional: She is  oriented to person, place, and time. She appears well-developed and well-nourished.  tearful  HENT:  Head: Normocephalic and atraumatic.  Cardiovascular: Normal rate, regular rhythm and normal heart sounds. Exam reveals no gallop and no friction rub.  No murmur heard. Respiratory: Effort normal and breath sounds normal. No respiratory distress. She has no wheezes. She has no  rales.  GI: Soft. Bowel sounds are normal. She exhibits no distension. There is no tenderness. There is no rebound and no guarding.  Genitourinary:  Genitourinary Comments: External genitalia within normal limits. Minimal blood noted at introitus and vaginal vault. Cervix pink without friability. No uterine enlargement or left adnexal tenderness. No CMT.   Musculoskeletal: She exhibits no edema.  Neurological: She is alert and oriented to person, place, and time.  Skin: Skin is warm and dry.  Psychiatric: She has a normal mood and affect.    MAU Course   MDM -DDX: ectopic pregnancy, threatened abortion, infection, ovarian cyst/torsion, nephrolithiasis -UPT positive -b-hcg 60, WBC slightly elevated at 13.6k -US shows pregnancy of unknown location, unable to exclude ectopic pregnancy -ABO: A positive  Assessment and Plan   22 year old G2P1001 at 6 weeks and 2 days who presented for evaluation of vaginal bleeding and abdominal cramping for 1 day. Workup revealed b-hcg of 60, WBC 13.6, wet prep WNL, U/S with no intrauterine gestation identified, unable to rule out ectopic, findings consistent with pregnancy of unknown location.  Pregnancy of unknown location: Scheduled patient for serial b-hcg on Thursday afternoon. Discussed b-hcg level and ultrasound findings in correlation with symptoms. Return precautions given (syncope, worsening abdominal pain, worsening vaginal bleeding) and patient provided understanding. Patient discharged home in stable condition.    Ferrel Logan 06/13/2018, 5:38 PM

## 2018-06-14 ENCOUNTER — Encounter (HOSPITAL_COMMUNITY): Payer: Self-pay | Admitting: Emergency Medicine

## 2018-06-14 ENCOUNTER — Other Ambulatory Visit: Payer: Self-pay

## 2018-06-14 ENCOUNTER — Inpatient Hospital Stay (HOSPITAL_COMMUNITY)
Admission: AD | Admit: 2018-06-14 | Discharge: 2018-06-15 | Disposition: A | Discharge status: Home / Self Care | Payer: 59 | Source: Ambulatory Visit | Attending: Obstetrics & Gynecology | Admitting: Obstetrics & Gynecology

## 2018-06-14 DIAGNOSIS — O039 Complete or unspecified spontaneous abortion without complication: Secondary | ICD-10-CM | POA: Insufficient documentation

## 2018-06-14 DIAGNOSIS — R109 Unspecified abdominal pain: Secondary | ICD-10-CM | POA: Diagnosis not present

## 2018-06-14 DIAGNOSIS — Z87891 Personal history of nicotine dependence: Secondary | ICD-10-CM | POA: Insufficient documentation

## 2018-06-14 LAB — HCG, QUANTITATIVE, PREGNANCY: hCG, Beta Chain, Quant, S: 24 m[IU]/mL — ABNORMAL HIGH (ref <5)

## 2018-06-14 LAB — GC/CHLAMYDIA PROBE AMP (~~LOC~~) NOT AT ARMC
CHLAMYDIA, DNA PROBE: NEGATIVE
NEISSERIA GONORRHEA: NEGATIVE

## 2018-06-14 MED ORDER — IBUPROFEN 800 MG PO TABS
800.0000 mg | ORAL_TABLET | Freq: Once | ORAL | Status: AC
  Administered 2018-06-15: 800 mg via ORAL
  Filled 2018-06-14: qty 1

## 2018-06-14 NOTE — MAU Provider Note (Signed)
History     CSN: [id number]  Arrival date and time: 06/14/18 1736   None     Chief Complaint  Patient presents with  . Abdominal Pain   HPI Chelsea Day is a 22yo G2P1001 in early pregnancy who presents for follow up of increased cramping and bleeding since she was here for eval yesterday. This began today at 1600 and has continued; she is passing clots larger than a quarter and has bled through pads x 2. Reports strong cramping. Was feeling dizzy in the waiting room earlier.  MAU visit 11/12: bHcg of 60; on U/S no IUP, but findings that could indicate early preg; plan was for repeat quant in 48hrs  OB History    Gravida  2   Para  1   Term  1   Preterm      AB      Living  1     SAB      TAB      Ectopic      Multiple  0   Live Births  1           Past Medical History:  Diagnosis Date  . Bilateral ovarian cysts   . Endometriosis determined by laparoscopy   . Irregular periods/menstrual cycles   . Pregnancy induced hypertension     Past Surgical History:  Procedure Laterality Date  . LAPAROSCOPIC OVARIAN CYSTECTOMY Right 01/24/2015   Procedure: LAPAROSCOPIC LYSIS OF ADHESION, LAPROSCOPIC RIGHT SALPINGOOOPHORECTOMY, BIOPSY OF RIGHT BOARD LIGAMENT; LYSIS BIOPSY OF ADHESIONS, BIOPSY OF LEFT UTERO LIGAMENT;  Surgeon: Ena Dawley, MD;  Location: [facility];  Service: Gynecology;  Laterality: Right;  . LAPAROSCOPY  11/08/2011   Procedure: LAPAROSCOPY OPERATIVE;  Surgeon: Olga Millers, MD;  Location: [facility];  Service: Gynecology;  Laterality: Right;  . OVARIAN CYST REMOVAL  11/08/2011   Procedure: OVARIAN CYSTECTOMY;  Surgeon: Olga Millers, MD;  Location: [facility];  Service: Gynecology;  Laterality: Right;  . UNILATERAL SALPINGECTOMY Right 01/24/15    Family History  Problem Relation Age of Onset  . Cancer Father        ???  . Multiple sclerosis Mother     Social History   Tobacco Use  . Smoking status: Former Smoker    Types: Cigarettes    Last  attempt to quit: 11/30/2013    Years since quitting: 4.5  . Smokeless tobacco: Never Used  Substance Use Topics  . Alcohol use: No    Alcohol/week: 0.0 standard drinks    Comment: social, not while pregnant  . Drug use: No    Allergies:  Allergies  Allergen Reactions  . Peanuts [Peanut Oil] Anaphylaxis and Hives    Allergic to all nuts  . Latex Rash    No medications prior to admission.    Review of Systems No other pertinents other than what is listed in HPI  Physical Exam   Blood pressure (!) 149/82, pulse (!) 115, temperature 98.2 F (36.8 C), resp. rate 16, weight 114.8 kg, last menstrual period 04/30/2018, SpO2 100 %, unknown if currently breastfeeding. Repeat BP/P: 113/62, P 81  Physical Exam  Constitutional: She is oriented to person, place, and time. She appears well-developed.  HENT:  Head: Normocephalic.  Neck: Normal range of motion.  Cardiovascular:  Tachycardic  Respiratory: Effort normal.  GI: Soft.  Genitourinary:  Genitourinary Comments: Pelvic deferred; sm bloody discharge on pad after 45 mins of wearing  Musculoskeletal: Normal range of motion.  Neurological: She is alert  and oriented to person, place, and time.  Skin: Skin is warm and dry.  Psychiatric: She has a normal mood and affect. Her behavior is normal. Thought content normal.   BHcg: 24  MAU Course  Procedures  MDM Given ibuprofen 800mg   Assessment and Plan  SAB in progress  D/C home with bleeding/SAB precautions Rx home ibuprofen and Tramadol prn cramping/pain Will keep f/u appt tomorrow with Dr Rulon Abide, [place] 06/14/2018, 11:30 PM

## 2018-06-14 NOTE — MAU Note (Signed)
Pt presents to MAU with complaints of lower abdominal cramping with an increase in pain. She was evaluated in MAU last night and was told to come back in if her pain got worse

## 2018-06-15 ENCOUNTER — Encounter: Payer: Self-pay | Admitting: *Deleted

## 2018-06-15 ENCOUNTER — Ambulatory Visit: Payer: Self-pay

## 2018-06-15 DIAGNOSIS — O039 Complete or unspecified spontaneous abortion without complication: Secondary | ICD-10-CM

## 2018-06-15 LAB — CBC
HEMATOCRIT: 40.1 % (ref 36.0–46.0)
Hemoglobin: 13 g/dL (ref 12.0–15.0)
MCH: 28.4 pg (ref 26.0–34.0)
MCHC: 32.4 g/dL (ref 30.0–36.0)
MCV: 87.7 fL (ref 80.0–100.0)
Platelets: 348 10*3/uL (ref 150–400)
RBC: 4.57 MIL/uL (ref 3.87–5.11)
RDW: 13.3 % (ref 11.5–15.5)
WBC: 13.8 10*3/uL — ABNORMAL HIGH (ref 4.0–10.5)
nRBC: 0 % (ref 0.0–0.2)

## 2018-06-15 MED ORDER — IBUPROFEN 800 MG PO TABS: 800.0000 mg | ORAL_TABLET | Freq: Three times a day (TID) | ORAL | 0 refills | Status: DC

## 2018-06-15 MED ORDER — TRAMADOL HCL 50 MG PO TABS: 50.0000 mg | ORAL_TABLET | Freq: Four times a day (QID) | ORAL | 0 refills | Status: DC | PRN

## 2018-06-15 NOTE — Progress Notes (Signed)
[facility] Stat bhcg for today but upon chart review patient came back to [facility] 06/14/18 for follow up and note states she will follow up with her OB Dr.Ozan today.

## 2018-06-15 NOTE — Discharge Instructions (Signed)

## 2018-06-19 DIAGNOSIS — O034 Incomplete spontaneous abortion without complication: Secondary | ICD-10-CM | POA: Diagnosis not present

## 2018-06-19 DIAGNOSIS — O200 Threatened abortion: Secondary | ICD-10-CM | POA: Diagnosis not present

## 2018-06-20 DIAGNOSIS — O039 Complete or unspecified spontaneous abortion without complication: Secondary | ICD-10-CM | POA: Diagnosis not present

## 2018-09-07 DIAGNOSIS — R200 Anesthesia of skin: Secondary | ICD-10-CM | POA: Diagnosis not present

## 2018-09-07 DIAGNOSIS — M79604 Pain in right leg: Secondary | ICD-10-CM | POA: Diagnosis not present

## 2018-09-21 ENCOUNTER — Ambulatory Visit: Payer: BLUE CROSS/BLUE SHIELD | Admitting: Neurology

## 2018-10-06 DIAGNOSIS — M791 Myalgia, unspecified site: Secondary | ICD-10-CM | POA: Diagnosis not present

## 2018-10-06 DIAGNOSIS — R51 Headache: Secondary | ICD-10-CM | POA: Diagnosis not present

## 2018-10-06 DIAGNOSIS — J111 Influenza due to unidentified influenza virus with other respiratory manifestations: Secondary | ICD-10-CM | POA: Diagnosis not present

## 2018-10-06 DIAGNOSIS — R5383 Other fatigue: Secondary | ICD-10-CM | POA: Diagnosis not present

## 2019-02-09 ENCOUNTER — Ambulatory Visit: Payer: 59 | Admitting: Allergy

## 2019-03-29 ENCOUNTER — Encounter (HOSPITAL_COMMUNITY): Payer: Self-pay | Admitting: *Deleted

## 2019-03-29 ENCOUNTER — Other Ambulatory Visit: Payer: Self-pay

## 2019-03-29 ENCOUNTER — Inpatient Hospital Stay (HOSPITAL_COMMUNITY)
Admission: AD | Admit: 2019-03-29 | Discharge: 2019-03-29 | Disposition: A | Discharge status: Home / Self Care | Payer: 59 | Attending: Obstetrics and Gynecology | Admitting: Obstetrics and Gynecology

## 2019-03-29 DIAGNOSIS — O26891 Other specified pregnancy related conditions, first trimester: Secondary | ICD-10-CM | POA: Insufficient documentation

## 2019-03-29 DIAGNOSIS — O219 Vomiting of pregnancy, unspecified: Secondary | ICD-10-CM

## 2019-03-29 DIAGNOSIS — R42 Dizziness and giddiness: Secondary | ICD-10-CM

## 2019-03-29 DIAGNOSIS — Z87891 Personal history of nicotine dependence: Secondary | ICD-10-CM | POA: Insufficient documentation

## 2019-03-29 DIAGNOSIS — R1030 Lower abdominal pain, unspecified: Secondary | ICD-10-CM | POA: Insufficient documentation

## 2019-03-29 DIAGNOSIS — Z3A10 10 weeks gestation of pregnancy: Secondary | ICD-10-CM | POA: Diagnosis not present

## 2019-03-29 LAB — GLUCOSE, CAPILLARY: Glucose-Capillary: 72 mg/dL (ref 70–99)

## 2019-03-29 LAB — URINALYSIS, ROUTINE W REFLEX MICROSCOPIC
Bilirubin Urine: NEGATIVE
Glucose, UA: NEGATIVE mg/dL
Hgb urine dipstick: NEGATIVE
Ketones, ur: NEGATIVE mg/dL
Leukocytes,Ua: NEGATIVE
Nitrite: NEGATIVE
Protein, ur: NEGATIVE mg/dL
Specific Gravity, Urine: 1.012 (ref 1.005–1.030)
pH: 7 (ref 5.0–8.0)

## 2019-03-29 LAB — POCT PREGNANCY, URINE: Preg Test, Ur: POSITIVE — AB

## 2019-03-29 MED ORDER — MECLIZINE HCL 25 MG PO TABS: 25.0000 mg | ORAL_TABLET | Freq: Three times a day (TID) | ORAL | 0 refills | Status: DC | PRN

## 2019-03-29 MED ORDER — METOCLOPRAMIDE HCL 10 MG PO TABS: 10.0000 mg | ORAL_TABLET | Freq: Three times a day (TID) | ORAL | 0 refills | Status: DC | PRN

## 2019-03-29 MED ORDER — PROMETHAZINE HCL 25 MG/ML IJ SOLN
25.0000 mg | Freq: Once | INTRAMUSCULAR | Status: AC
  Administered 2019-03-29: 25 mg via INTRAVENOUS
  Filled 2019-03-29: qty 1

## 2019-03-29 MED ORDER — MECLIZINE HCL 25 MG PO TABS
50.0000 mg | ORAL_TABLET | Freq: Once | ORAL | Status: AC
  Administered 2019-03-29: 50 mg via ORAL
  Filled 2019-03-29: qty 2

## 2019-03-29 MED ORDER — FAMOTIDINE IN NACL 20-0.9 MG/50ML-% IV SOLN
20.0000 mg | Freq: Once | INTRAVENOUS | Status: AC
  Administered 2019-03-29: 20 mg via INTRAVENOUS
  Filled 2019-03-29: qty 50

## 2019-03-29 MED ORDER — LACTATED RINGERS IV BOLUS
1000.0000 mL | Freq: Once | INTRAVENOUS | Status: AC
  Administered 2019-03-29: 1000 mL via INTRAVENOUS

## 2019-03-29 NOTE — MAU Provider Note (Signed)
Chief Complaint: Emesis, Nausea, and Dizziness   First Provider Initiated Contact with Patient 03/29/19 1024     SUBJECTIVE HPI: Chelsea Day is a 23 y.o. G3P1011 at [redacted]w[redacted]d who presents to Maternity Admissions reporting n/v & dizziness. Has had vomiting for the last week. Was prescribed diclegis but hasn't gotten prescription filled d/t prior authorization pending. States she vomits every time she eats and hasn't been able to keep down solids in 3 days. Can sometimes keep down water. This morning felt dizzy and shaky after vomiting. Denies fever/chills, diarrhea, chest pain, SOB, syncope,or vaginal bleeding. Reports some lower abdominal pain that has been ongoing during this pregnancy. Sees Dr. Nelda Marseille in the office & had an ultrasound that confirmed IUP.   Location: abdomen Quality: cramping Severity: 5/10 on pain scale Duration: 1 month Timing: intermittent Modifying factors: none Associated signs and symptoms: n/v  Past Medical History:  Diagnosis Date  . Bilateral ovarian cysts   . Endometriosis determined by laparoscopy   . Irregular periods/menstrual cycles   . Pregnancy induced hypertension    OB History  Gravida Para Term Preterm AB Living  3 1 1   1 1   SAB TAB Ectopic Multiple Live Births  1     0 1    # Outcome Date GA Lbr Len/2nd Weight Sex Delivery Anes PTL Lv  3 Current           2 Term 08/13/16 [redacted]w[redacted]d 07:16 / 00:37 3050 g F Vag-Spont EPI  LIV  1 SAB            Past Surgical History:  Procedure Laterality Date  . LAPAROSCOPIC OVARIAN CYSTECTOMY Right 01/24/2015   Procedure: LAPAROSCOPIC LYSIS OF ADHESION, LAPROSCOPIC RIGHT SALPINGOOOPHORECTOMY, BIOPSY OF RIGHT BOARD LIGAMENT; LYSIS BIOPSY OF ADHESIONS, BIOPSY OF LEFT UTERO LIGAMENT;  Surgeon: Ena Dawley, MD;  Location: [facility];  Service: Gynecology;  Laterality: Right;  . LAPAROSCOPY  11/08/2011   Procedure: LAPAROSCOPY OPERATIVE;  Surgeon: Olga Millers, MD;  Location: [facility];  Service: Gynecology;  Laterality:  Right;  . OVARIAN CYST REMOVAL  11/08/2011   Procedure: OVARIAN CYSTECTOMY;  Surgeon: Olga Millers, MD;  Location: [facility];  Service: Gynecology;  Laterality: Right;  . UNILATERAL SALPINGECTOMY Right 01/24/15   Social History   Socioeconomic History  . Marital status: Married    Spouse name: Not on file  . Number of children: Not on file  . Years of education: Not on file  . Highest education level: Not on file  Occupational History  . Not on file  Social Needs  . Financial resource strain: Not on file  . Food insecurity    Worry: Not on file    Inability: Not on file  . Transportation needs    Medical: Not on file    Non-medical: Not on file  Tobacco Use  . Smoking status: Former Smoker    Types: Cigarettes    Quit date: 11/30/2013    Years since quitting: 5.3  . Smokeless tobacco: Never Used  Substance and Sexual Activity  . Alcohol use: No    Alcohol/week: 0.0 standard drinks    Comment: social, not while pregnant  . Drug use: No  . Sexual activity: Yes  Lifestyle  . Physical activity    Days per week: Not on file    Minutes per session: Not on file  . Stress: Not on file  Relationships  . Social connections    Talks on phone: Not on file  Gets together: Not on file    Attends religious service: Not on file    Active member of club or organization: Not on file    Attends meetings of clubs or organizations: Not on file    Relationship status: Not on file  . Intimate partner violence    Fear of current or ex partner: Not on file    Emotionally abused: Not on file    Physically abused: Not on file    Forced sexual activity: Not on file  Other Topics Concern  . Not on file  Social History Narrative  . Not on file   Family History  Problem Relation Age of Onset  . Cancer Father        ???  . Multiple sclerosis Mother    No current facility-administered medications on file prior to encounter.    Current Outpatient Medications on File Prior to Encounter   Medication Sig Dispense Refill  . Prenatal Vit-Fe Fumarate-FA (PRENATAL MULTIVITAMIN) TABS tablet Take 1 tablet by mouth daily at 12 noon.     Allergies  Allergen Reactions  . Peanuts [Peanut Oil] Anaphylaxis and Hives    Allergic to all nuts  . Latex Rash    I have reviewed patient's Past Medical Hx, Surgical Hx, Family Hx, Social Hx, medications and allergies.   Review of Systems  Constitutional: Negative.   Respiratory: Negative for shortness of breath.   Cardiovascular: Negative for chest pain.  Gastrointestinal: Positive for abdominal pain, nausea and vomiting. Negative for constipation and diarrhea.  Genitourinary: Negative.   Neurological: Positive for dizziness and headaches. Negative for syncope.    OBJECTIVE Patient Vitals for the past 24 hrs:  BP Temp Temp src Pulse Resp SpO2 Height Weight  03/29/19 1421 (!) 116/51 - - 75 - - - -  03/29/19 1039 - - - 96 - - - -  03/29/19 1001 (!) 141/80 - - (!) 102 - - - -  03/29/19 0941 (!) 151/87 98.8 F (37.1 C) Oral 97 20 100 % 5\' 10"  (1.778 m) 117 kg    Constitutional: Well-developed, well-nourished female in no acute distress.  Cardiovascular: mildly tachycardia. Normal rhythm, no murmur Respiratory: normal rate and effort. Lung sounds clear throughout GI: Abd soft, non-tender, Pos BS x 4. No guarding or rebound tenderness MS: Extremities nontender, no edema, normal ROM Neurologic: Alert and oriented x 4.      LAB RESULTS Results for orders placed or performed during the hospital encounter of 03/29/19 (from the past 24 hour(s))  Urinalysis, Routine w reflex microscopic     Status: Abnormal   Collection Time: 03/29/19  9:55 AM  Result Value Ref Range   Color, Urine YELLOW YELLOW   APPearance HAZY (A) CLEAR   Specific Gravity, Urine 1.012 1.005 - 1.030   pH 7.0 5.0 - 8.0   Glucose, UA NEGATIVE NEGATIVE mg/dL   Hgb urine dipstick NEGATIVE NEGATIVE   Bilirubin Urine NEGATIVE NEGATIVE   Ketones, ur NEGATIVE NEGATIVE  mg/dL   Protein, ur NEGATIVE NEGATIVE mg/dL   Nitrite NEGATIVE NEGATIVE   Leukocytes,Ua NEGATIVE NEGATIVE  Pregnancy, urine POC     Status: Abnormal   Collection Time: 03/29/19  9:56 AM  Result Value Ref Range   Preg Test, Ur POSITIVE (A) NEGATIVE  Glucose, capillary     Status: None   Collection Time: 03/29/19 11:11 AM  Result Value Ref Range   Glucose-Capillary 72 70 - 99 mg/dL    IMAGING No results found.  MAU COURSE  Orders Placed This Encounter  Procedures  . Urinalysis, Routine w reflex microscopic  . Glucose, capillary  . Pregnancy, urine POC  . Discharge patient   Meds ordered this encounter  Medications  . lactated ringers bolus 1,000 mL  . promethazine (PHENERGAN) injection 25 mg  . famotidine (PEPCID) IVPB 20 mg premix  . lactated ringers bolus 1,000 mL  . meclizine (ANTIVERT) tablet 50 mg  . metoCLOPramide (REGLAN) 10 MG tablet    Sig: Take 1 tablet (10 mg total) by mouth every 8 (eight) hours as needed for nausea.    Dispense:  30 tablet    Refill:  0    Order Specific Question:   Supervising Provider    Answer:   CONSTANT, PEGGY [[id number]]  . meclizine (ANTIVERT) 25 MG tablet    Sig: Take 1 tablet (25 mg total) by mouth 3 (three) times daily as needed for dizziness or nausea.    Dispense:  30 tablet    Refill:  0    Order Specific Question:   Supervising Provider    Answer:   CONSTANT, PEGGY [[id number]]    MDM Pt informed that the ultrasound is considered a limited OB ultrasound and is not intended to be a complete ultrasound exam.  Patient also informed that the ultrasound is not being completed with the intent of assessing for fetal or placental anomalies or any pelvic abnormalities.  Explained that the purpose of today's ultrasound is to assess for  viability.  Patient acknowledges the purpose of the exam and the limitations of the study.  Live IUP with FHR 162 bpm.   Pt not orthostatic. BPs consistently elevated in MAU. Denies hx of hypertension outside of  pregnancy but didn't have GHTN in last pregnancy. Discussed with patient she may have CHTN and that would need to be addressed at her next OB visit.   Lower abdominal cramping is not a new symptoms. Pt states it has been the same since she found out she was pregnant. U/a with no signs of infection. Denies vaginal bleeding. Live IUP on bedside ultrasound today.   Will give IV fluids & meds. 1 liter of LR, 20 mg pepcid, 25 mg phenergan.  Discussed OTC equivalent of diclegis & will prescribed with additional antiemetics.  Pt reports improvement in nausea but continued dizziness. Meclizine 25 mg PO given + additional bag of fluids. Reports improvement in symptoms and requests to be discharged home.   ASSESSMENT 1. Nausea and vomiting during pregnancy prior to [redacted] weeks gestation   2. Dizziness     PLAN  Discharge home in stable condition. Discussed reasons to return to MAU Rx meclizine & reglan Instructions given for vit b6 & unisom  Follow-up Information    Gynecology, [facility] up.   Specialty: Obstetrics and Gynecology Why: keep scheduled appointment or call office as needed Contact information: [place] [address] [phone]        [facility] up.   Specialty: Obstetrics and Gynecology Why: return for worsening symptoms Contact information: [address] [id number] [place] [phone]         Allergies as of 03/29/2019      Reactions   Peanuts [peanut Oil] Anaphylaxis, Hives   Allergic to all nuts   Latex Rash      Medication List    STOP taking these medications   ibuprofen 800 MG tablet Commonly known as: ADVIL  traMADol 50 MG tablet Commonly known as: ULTRAM     TAKE these medications   meclizine 25 MG tablet Commonly known as: ANTIVERT Take 1 tablet (25 mg total) by mouth 3 (three) times daily as needed for dizziness or nausea.    metoCLOPramide 10 MG tablet Commonly known as: REGLAN Take 1 tablet (10 mg total) by mouth every 8 (eight) hours as needed for nausea.   prenatal multivitamin Tabs tablet Take 1 tablet by mouth daily at 12 noon.        Jorje Guild, NP 03/29/2019  3:04 PM

## 2019-03-29 NOTE — Discharge Instructions (Signed)
Nausea/Vomiting:  Bonine Dramamine Emetrol Ginger extract Sea bands Meclizine  Nausea medication to take during pregnancy:  Unisom (doxylamine succinate 25 mg tablets) Take one tablet daily at bedtime. If symptoms are not adequately controlled, the dose can be increased to a maximum recommended dose of two tablets daily (1/2 tablet in the morning, 1/2 tablet mid-afternoon and one at bedtime). Vitamin B6 100mg  tablets. Take one tablet twice a day (up to 200 mg per day).       Morning Sickness  Morning sickness is when a woman feels nauseous during pregnancy. This nauseous feeling may or may not come with vomiting. It often occurs in the morning, but it can be a problem at any time of day. Morning sickness is most common during the first trimester. In some cases, it may continue throughout pregnancy. Although morning sickness is unpleasant, it is usually harmless unless the woman develops severe and continual vomiting (hyperemesis gravidarum), a condition that requires more intense treatment. What are the causes? The exact cause of this condition is not known, but it seems to be related to normal hormonal changes that occur in pregnancy. What increases the risk? You are more likely to develop this condition if:  You experienced nausea or vomiting before your pregnancy.  You had morning sickness during a previous pregnancy.  You are pregnant with more than one baby, such as twins. What are the signs or symptoms? Symptoms of this condition include:  Nausea.  Vomiting. How is this diagnosed? This condition is usually diagnosed based on your signs and symptoms. How is this treated? In many cases, treatment is not needed for this condition. Making some changes to what you eat may help to control symptoms. Your health care provider may also prescribe or recommend:  Vitamin B6 supplements.  Anti-nausea medicines.  Ginger. Follow these instructions at home: Medicines  Take  over-the-counter and prescription medicines only as told by your health care provider. Do not use any prescription, over-the-counter, or herbal medicines for morning sickness without first talking with your health care provider.  Taking multivitamins before getting pregnant can prevent or decrease the severity of morning sickness in most women. Eating and drinking  Eat a piece of dry toast or crackers before getting out of bed in the morning.  Eat 5 or 6 small meals a day.  Eat dry and bland foods, such as rice or a baked potato. Foods that are high in carbohydrates are often helpful.  Avoid greasy, fatty, and spicy foods.  Have someone cook for you if the smell of any food causes nausea and vomiting.  If you feel nauseous after taking prenatal vitamins, take the vitamins at night or with a snack.  Snack on protein foods between meals if you are hungry. Nuts, yogurt, and cheese are good options.  Drink fluids throughout the day.  Try ginger ale made with real ginger, ginger tea made from fresh grated ginger, or ginger candies. General instructions  Do not use any products that contain nicotine or tobacco, such as cigarettes and e-cigarettes. If you need help quitting, ask your health care provider.  Get an air purifier to keep the air in your house free of odors.  Get plenty of fresh air.  Try to avoid odors that trigger your nausea.  Consider trying these methods to help relieve symptoms: ? Wearing an acupressure wristband. These wristbands are often worn for seasickness. ? Acupuncture. Contact a health care provider if:  Your home remedies are not working and you  need medicine.  You feel dizzy or light-headed.  You are losing weight. Get help right away if:  You have persistent and uncontrolled nausea and vomiting.  You faint.  You have severe pain in your abdomen. Summary  Morning sickness is when a woman feels nauseous during pregnancy. This nauseous feeling may  or may not come with vomiting.  Morning sickness is most common during the first trimester.  It often occurs in the morning, but it can be a problem at any time of day.  In many cases, treatment is not needed for this condition. Making some changes to what you eat may help to control symptoms. This information is not intended to replace advice given to you by your health care provider. Make sure you discuss any questions you have with your health care provider. Document Released: 09/09/2006 Document Revised: 07/01/2017 Document Reviewed: 08/21/2016 Elsevier Patient Education  2020 [employer].

## 2019-03-29 NOTE — MAU Note (Signed)
Is really dizzy, can't stop shaking.  Started this morning when she got up.  Hasn't been able to eat anything. Trying to drink water, but that keeps coming up.  Having abd cramping, ongoing issue.

## 2019-05-01 ENCOUNTER — Inpatient Hospital Stay (HOSPITAL_COMMUNITY): Payer: 59

## 2019-05-01 ENCOUNTER — Encounter (HOSPITAL_COMMUNITY): Payer: Self-pay

## 2019-05-01 ENCOUNTER — Inpatient Hospital Stay (HOSPITAL_COMMUNITY)
Admission: AD | Admit: 2019-05-01 | Discharge: 2019-05-02 | Disposition: A | Discharge status: Home / Self Care | Payer: 59 | Attending: Obstetrics and Gynecology | Admitting: Obstetrics and Gynecology

## 2019-05-01 ENCOUNTER — Other Ambulatory Visit: Payer: Self-pay

## 2019-05-01 DIAGNOSIS — O26892 Other specified pregnancy related conditions, second trimester: Secondary | ICD-10-CM | POA: Diagnosis not present

## 2019-05-01 DIAGNOSIS — R51 Headache: Secondary | ICD-10-CM | POA: Diagnosis present

## 2019-05-01 DIAGNOSIS — Z3A14 14 weeks gestation of pregnancy: Secondary | ICD-10-CM | POA: Diagnosis not present

## 2019-05-01 DIAGNOSIS — Z87891 Personal history of nicotine dependence: Secondary | ICD-10-CM | POA: Insufficient documentation

## 2019-05-01 DIAGNOSIS — O169 Unspecified maternal hypertension, unspecified trimester: Secondary | ICD-10-CM

## 2019-05-01 HISTORY — DX: Personal history of other complications of pregnancy, childbirth and the puerperium: Z87.59

## 2019-05-01 LAB — URINALYSIS, ROUTINE W REFLEX MICROSCOPIC
Bilirubin Urine: NEGATIVE
Glucose, UA: NEGATIVE mg/dL
Hgb urine dipstick: NEGATIVE
Ketones, ur: NEGATIVE mg/dL
Nitrite: NEGATIVE
Protein, ur: NEGATIVE mg/dL
Specific Gravity, Urine: 1.012 (ref 1.005–1.030)
pH: 6 (ref 5.0–8.0)

## 2019-05-01 MED ORDER — PROMETHAZINE HCL 25 MG PO TABS
25.0000 mg | ORAL_TABLET | Freq: Four times a day (QID) | ORAL | Status: DC | PRN
  Administered 2019-05-01: 25 mg via ORAL
  Filled 2019-05-01 (×2): qty 1

## 2019-05-01 MED ORDER — LABETALOL HCL 100 MG PO TABS
100.0000 mg | ORAL_TABLET | Freq: Two times a day (BID) | ORAL | Status: DC
  Administered 2019-05-01: 100 mg via ORAL
  Filled 2019-05-01: qty 1

## 2019-05-01 MED ORDER — BUTORPHANOL TARTRATE 10 MG/ML NA SOLN
1.0000 | NASAL | Status: AC
  Administered 2019-05-01: 1 via NASAL
  Filled 2019-05-01: qty 2.5

## 2019-05-01 NOTE — MAU Note (Signed)
Presents with c/o H/A since Thursday, unrelieved with meds.  Reports has taken Tylenol and Motrin paired with Fioricet, H/A remains. Reports went to urgent care but instructed to come to MAU for evaluation secondary ^BP @ their facility. Denies problems related to the pregnancy, no VB.

## 2019-05-01 NOTE — MAU Provider Note (Addendum)
Chelsea Day, 23 y.o., [phone], 82w6dpresents to MAU w/ c/o HA since 04/26/19. Has tried Tylenol, Fioricet, chiropractic adjustments x2 and  not relieved w/ efforts. Hx of pre-ecl w/ first preg. Reports nml BP w/ annual well-visits. Denies hx of migraines and seasonal allergies, denies epigastric pain.   ROS Non-contributory except for HA O: Admission VS 05/01/19 2000  -  118Abnormal   -  -  149/95Abnormal   -  -  -  - CW   05/01/19 1946  -  88  -  -  140/72  -  -  -  - CW   05/01/19 1931  -  103Abnormal   -  -  149/80Abnormal   -  -  -  - CW   05/01/19 1926  -  108Abnormal   -  18  158/75Abnormal   -  -  -  - CW   05/01/19 1902  98.3 F (36.8 C)  115Abnormal   -  20  172/94Abnormal   Sitting  100 %  -  - FM     BP 122/64   Pulse 99   Temp 98.3 F (36.8 C) (Oral)   Resp 18   Ht 5' 11"  (1.803 m)   Wt 119.2 kg   LMP 04/30/2018   SpO2 100%   BMI 36.64 kg/m  Labs   Past medical history:  Past Medical History:  Diagnosis Date  . Bilateral ovarian cysts   . Endometriosis determined by laparoscopy   . History of postpartum uterine bleeding   . Irregular periods/menstrual cycles   . Pregnancy induced hypertension     Past surgical history:  Past Surgical History:  Procedure Laterality Date  . LAPAROSCOPIC OVARIAN CYSTECTOMY Right 01/24/2015   Procedure: LAPAROSCOPIC LYSIS OF ADHESION, LAPROSCOPIC RIGHT SALPINGOOOPHORECTOMY, BIOPSY OF RIGHT BOARD LIGAMENT; LYSIS BIOPSY OF ADHESIONS, BIOPSY OF LEFT UTERO LIGAMENT;  Surgeon: AEna Dawley MD;  [facility];  Service: Gynecology;  Laterality: Right;  . LAPAROSCOPY  11/08/2011   Procedure: LAPAROSCOPY OPERATIVE;  Surgeon: MOlga Millers MD;  [facility];  Service: Gynecology;  Laterality: Right;  . OVARIAN CYST REMOVAL  11/08/2011   Procedure: OVARIAN CYSTECTOMY;  Surgeon: MOlga Millers MD;  [facility];  Service: Gynecology;  Laterality: Right;  . UNILATERAL SALPINGECTOMY Right 01/24/15   Family History:   Family History  Problem Relation Age of Onset  . Cancer Father        ???  . Multiple sclerosis Mother     Social History:  reports that she quit smoking about 5 years ago. Her smoking use included cigarettes. She has never used smokeless tobacco. She reports that she does not drink alcohol or use drugs.  Physical exam: General: AAO x3 CV: HRR Abd: gravid, soft, non-tender Neuro: double vision GI/GU: wnl  A: Chelsea Heinke89y.o. [phone]@ 167w6deadache     -Rule out CHTN vs new onset migraine   P: Labetalol 100 mg PO BID Phenergan 25 mg PO  Re-assess BP  Exam and plan reviewed w/ Dr. DiCharlesetta Garibaldi  ViBurman FosterMSN, CNM 05/02/2019 12:33 AM

## 2019-05-02 MED ORDER — LABETALOL HCL 100 MG PO TABS: 100.0000 mg | ORAL_TABLET | Freq: Two times a day (BID) | ORAL | 1 refills | Status: DC

## 2019-05-02 MED ORDER — BUTORPHANOL TARTRATE 10 MG/ML NA SOLN
1.0000 | NASAL | Status: AC
  Administered 2019-05-02: 1 via NASAL

## 2019-05-02 NOTE — Discharge Summary (Signed)
Patient Name: Chelsea Day DOB: 22-May-1996 MRN: [id number]  Seen in MAU: 05/01/2019 Intrauterine pregnancy: [redacted]w[redacted]d   Admitting diagnosis: HBP Acute Dizziness  Secondary diagnosis: Chronic Hypertension  Date of discharge: 05/02/2019    Discharge diagnosis: CHTN and possible new onset migraine      Prenatal history: G3P1011   EDC : 10/24/2019, Date entered prior to episode creation  Prenatal care at [facility]  Primary provider : Dr. Landry Mellow Prenatal course complicated by [facility]  MAU visit summary      Pt c/o intense HA since 04/26/19, not relieved w/ Tylenol, Fioricet, and chiropractic adjustments x2. Denies hx of migraines. Admitting BP 172/94, pre-ecl w/ pregnancy in 2018, normotensive since 2018. Suspect CHTN.  BP to normal range after Labetalol, HA worsened after Labetalol. CT to rule out hemorrhage. CT negative. HA reduced w/ intranasal Stadol. D/C'd home w/ Labetalol and instructed to sched appt w/ [facility] this week. Keep next sched appt on 05/25/19 also.      Disposition:Home  Labs:  Urinalysis    Component Value Date/Time   COLORURINE YELLOW 05/01/2019 1947   APPEARANCEUR HAZY (A) 05/01/2019 1947   LABSPEC 1.012 05/01/2019 1947   PHURINE 6.0 05/01/2019 1947   GLUCOSEU NEGATIVE 05/01/2019 1947   HGBUR NEGATIVE 05/01/2019 [place] NEGATIVE 05/01/2019 [place] NEGATIVE 05/01/2019 1947   PROTEINUR NEGATIVE 05/01/2019 1947   UROBILINOGEN 1.0 09/23/2014 0120   NITRITE NEGATIVE 05/01/2019 1947   LEUKOCYTESUR TRACE (A) 05/01/2019 1947      Physical Exam @ time of discharge:  Vitals:   05/01/19 2016 05/01/19 2030 05/01/19 2052 05/01/19 2209  BP: 130/66 122/64 138/79 131/80  Pulse: 98 99 (!) 101 (!) 102  Resp:      Temp:      TempSrc:      SpO2:      Weight:      Height:       FHT's: 156  Discharge Medications:   Follow up: Make an appt w/ [facility] this week Start taking: Labetalol 100 mg PO BID  Signed: Arrie Eastern CNM, MSN 05/02/2019, 2:30  AM

## 2019-05-02 NOTE — Discharge Instructions (Signed)

## 2019-05-02 NOTE — Progress Notes (Addendum)
S:  Pt reports worsening of HA after Labetalol and Phenergan. BP improved w/ meds.   O:  BP 131/80   Pulse (!) 102   Temp 98.3 F (36.8 C) (Oral)   Resp 18   Ht 5\' 11"  (1.803 m)   Wt 119.2 kg   LMP 04/30/2018   SpO2 100%   BMI 36.64 kg/m   A:  Suspect migraine    -Rule out hemorrhage  P:  Stadol per nasal route Head CT  Exam and plan reviewed w/ Dr. Charlesetta Garibaldi.   Burman Foster, MSN, CNM 05/02/2019 12:44 AM

## 2019-05-22 ENCOUNTER — Other Ambulatory Visit: Payer: Self-pay

## 2019-05-22 ENCOUNTER — Inpatient Hospital Stay (HOSPITAL_COMMUNITY)
Admission: AD | Admit: 2019-05-22 | Discharge: 2019-05-22 | Disposition: A | Discharge status: Home / Self Care | Payer: 59 | Attending: Obstetrics and Gynecology | Admitting: Obstetrics and Gynecology

## 2019-05-22 ENCOUNTER — Encounter (HOSPITAL_COMMUNITY): Payer: Self-pay | Admitting: *Deleted

## 2019-05-22 DIAGNOSIS — O26892 Other specified pregnancy related conditions, second trimester: Secondary | ICD-10-CM | POA: Diagnosis not present

## 2019-05-22 DIAGNOSIS — Z87891 Personal history of nicotine dependence: Secondary | ICD-10-CM | POA: Diagnosis not present

## 2019-05-22 DIAGNOSIS — Z3A17 17 weeks gestation of pregnancy: Secondary | ICD-10-CM | POA: Diagnosis not present

## 2019-05-22 DIAGNOSIS — G441 Vascular headache, not elsewhere classified: Secondary | ICD-10-CM | POA: Diagnosis not present

## 2019-05-22 DIAGNOSIS — R519 Headache, unspecified: Secondary | ICD-10-CM | POA: Diagnosis present

## 2019-05-22 LAB — COMPREHENSIVE METABOLIC PANEL
ALT: 15 U/L (ref 0–44)
AST: 13 U/L — ABNORMAL LOW (ref 15–41)
Albumin: 3.4 g/dL — ABNORMAL LOW (ref 3.5–5.0)
Alkaline Phosphatase: 68 U/L (ref 38–126)
Anion gap: 7 (ref 5–15)
BUN: <5 mg/dL — ABNORMAL LOW (ref 6–20)
CO2: 22 mmol/L (ref 22–32)
Calcium: 9.2 mg/dL (ref 8.9–10.3)
Chloride: 108 mmol/L (ref 98–111)
Creatinine, Ser: 0.58 mg/dL (ref 0.44–1.00)
GFR calc Af Amer: >60 mL/min (ref >60)
GFR calc non Af Amer: >60 mL/min (ref >60)
Glucose, Bld: 96 mg/dL (ref 70–99)
Potassium: 4 mmol/L (ref 3.5–5.1)
Sodium: 137 mmol/L (ref 135–145)
Total Bilirubin: 0.5 mg/dL (ref 0.3–1.2)
Total Protein: 6.6 g/dL (ref 6.5–8.1)

## 2019-05-22 LAB — CBC
HCT: 36.1 % (ref 36.0–46.0)
Hemoglobin: 11.9 g/dL — ABNORMAL LOW (ref 12.0–15.0)
MCH: 28.8 pg (ref 26.0–34.0)
MCHC: 33 g/dL (ref 30.0–36.0)
MCV: 87.4 fL (ref 80.0–100.0)
Platelets: 306 10*3/uL (ref 150–400)
RBC: 4.13 MIL/uL (ref 3.87–5.11)
RDW: 13.6 % (ref 11.5–15.5)
WBC: 13.8 10*3/uL — ABNORMAL HIGH (ref 4.0–10.5)
nRBC: 0 % (ref 0.0–0.2)

## 2019-05-22 LAB — URINALYSIS, ROUTINE W REFLEX MICROSCOPIC
Bilirubin Urine: NEGATIVE
Glucose, UA: NEGATIVE mg/dL
Hgb urine dipstick: NEGATIVE
Ketones, ur: NEGATIVE mg/dL
Nitrite: NEGATIVE
Protein, ur: NEGATIVE mg/dL
Specific Gravity, Urine: 1.008 (ref 1.005–1.030)
pH: 7 (ref 5.0–8.0)

## 2019-05-22 MED ORDER — LABETALOL HCL 100 MG PO TABS: 200.0000 mg | ORAL_TABLET | Freq: Two times a day (BID) | ORAL | 1 refills | Status: DC

## 2019-05-22 MED ORDER — DEXAMETHASONE SODIUM PHOSPHATE 10 MG/ML IJ SOLN
10.0000 mg | Freq: Once | INTRAMUSCULAR | Status: AC
  Administered 2019-05-22: 10 mg via INTRAVENOUS
  Filled 2019-05-22: qty 1

## 2019-05-22 MED ORDER — CYCLOBENZAPRINE HCL 10 MG PO TABS: 10.0000 mg | ORAL_TABLET | Freq: Two times a day (BID) | ORAL | 0 refills | Status: DC | PRN

## 2019-05-22 MED ORDER — DIPHENHYDRAMINE HCL 50 MG/ML IJ SOLN
12.5000 mg | Freq: Once | INTRAMUSCULAR | Status: AC
  Administered 2019-05-22: 10:00:00 12.5 mg via INTRAVENOUS
  Filled 2019-05-22: qty 1

## 2019-05-22 MED ORDER — METOCLOPRAMIDE HCL 5 MG/ML IJ SOLN
10.0000 mg | Freq: Once | INTRAMUSCULAR | Status: AC
  Administered 2019-05-22: 10 mg via INTRAVENOUS
  Filled 2019-05-22: qty 2

## 2019-05-22 MED ORDER — LACTATED RINGERS IV BOLUS
1000.0000 mL | Freq: Once | INTRAVENOUS | Status: AC
  Administered 2019-05-22: 1000 mL via INTRAVENOUS

## 2019-05-22 NOTE — MAU Provider Note (Addendum)
Patient Chelsea Day is a 23 y.o. G3P1011 At [redacted]w[redacted]d here with complaints of unbearable headache that started on Sunday night. She endorses some blurry vision; denies nausea, vomiting, LOF, vaginal bleeding, dysuria. She denies SOB, fever, or other complaints. This headache is similar to another HA she had on 9-30; she received stadol with that HA and it resolved. CT scan that day was normal.   She is a chronic hypertensive on 200 mg of labetalol BID. Patient was prescribed 100 mg of labetalol BID but has been taking 200 mg BID since it was prescribed (she read the bottle wrong). Her BP has been well-controlled (120s/70s-80s) except for on Sunday and Monday, when her pressure was 160/90s x 2.  History     CSN: [id number]  Arrival date and time: 05/22/19 0930   First Provider Initiated Contact with Patient 05/22/19 1023      Chief Complaint  Patient presents with  . Headache  . Hypertension   Headache  This is a new problem. The current episode started in the past 7 days. The problem occurs constantly. The problem has been unchanged. The pain is located in the frontal region. The pain quality is similar to prior headaches. The quality of the pain is described as aching. The pain is at a severity of 10/10. Nothing aggravates the symptoms.    OB History    Gravida  3   Para  1   Term  1   Preterm      AB  1   Living  1     SAB  1   TAB      Ectopic      Multiple  0   Live Births  1           Past Medical History:  Diagnosis Date  . Bilateral ovarian cysts   . Endometriosis determined by laparoscopy   . History of postpartum uterine bleeding   . Irregular periods/menstrual cycles   . Pregnancy induced hypertension     Past Surgical History:  Procedure Laterality Date  . LAPAROSCOPIC OVARIAN CYSTECTOMY Right 01/24/2015   Procedure: LAPAROSCOPIC LYSIS OF ADHESION, LAPROSCOPIC RIGHT SALPINGOOOPHORECTOMY, BIOPSY OF RIGHT BOARD LIGAMENT; LYSIS BIOPSY OF  ADHESIONS, BIOPSY OF LEFT UTERO LIGAMENT;  Surgeon: Ena Dawley, MD;  Location: [facility];  Service: Gynecology;  Laterality: Right;  . LAPAROSCOPY  11/08/2011   Procedure: LAPAROSCOPY OPERATIVE;  Surgeon: Olga Millers, MD;  Location: [facility];  Service: Gynecology;  Laterality: Right;  . OVARIAN CYST REMOVAL  11/08/2011   Procedure: OVARIAN CYSTECTOMY;  Surgeon: Olga Millers, MD;  Location: [facility];  Service: Gynecology;  Laterality: Right;  . UNILATERAL SALPINGECTOMY Right 01/24/15    Family History  Problem Relation Age of Onset  . Cancer Father        ???  . Multiple sclerosis Mother     Social History   Tobacco Use  . Smoking status: Former Smoker    Types: Cigarettes    Quit date: 11/30/2013    Years since quitting: 5.4  . Smokeless tobacco: Never Used  Substance Use Topics  . Alcohol use: No    Alcohol/week: 0.0 standard drinks    Comment: social, not while pregnant  . Drug use: No    Allergies:  Allergies  Allergen Reactions  . Peanuts [Peanut Oil] Anaphylaxis and Hives    Allergic to all nuts  . Latex Rash    No medications prior to admission.    Review of Systems  Constitutional: Negative.   HENT: Negative.   Respiratory: Negative.   Cardiovascular: Negative.   Gastrointestinal: Negative.   Neurological: Positive for headaches.  Psychiatric/Behavioral: Negative.    Physical Exam   Blood pressure 129/70, pulse (!) 109, temperature 98.3 F (36.8 C), resp. rate 16, last menstrual period 04/30/2018, SpO2 100 %, unknown if currently breastfeeding.  Physical Exam  Constitutional: She appears well-developed.  HENT:  Head: Normocephalic.  Neck: Normal range of motion.  Respiratory: Effort normal.  GI: Soft.  Musculoskeletal: Normal range of motion.    MAU Course  Procedures  MDM -headache cocktail given, patients HA now a 0/10 and she slept in MAU.  -while patient is not yet 20 weeks, CBC and CMP were drawn to establish baseline in case patient  develops pre-e.  -CBC and CMP are normal; PCR pending.  -unclear whether or not HA caused the elevated blood pressure or if the blood pressure elevations caused the headache.  Discharge BOP was 129/70.  -FHR by Doppler is 154 Assessment and Plan   1. Other vascular headache    2. Patient feeling better; she has no HA right now. RX for Flexeril given.   3. Appt made for patient to have BP check and provider visit on 10-22, patient knows that it is important to keep this appt.   4. Strict return precautions given; patient to return if condition changes or worsens.  Mervyn Skeeters Kooistra 05/22/2019, 12:08 PM

## 2019-05-22 NOTE — MAU Note (Signed)
.   Chelsea Day is a 23 y.o. at [redacted]w[redacted]d here in MAU reporting: severe headache that started Sunday night. Pt was evaluated in MAU 3 weeks ago and was given labetalol 100mg  po BID but has been taking 200mg  BID by mistake. PT states she was told to come back in if her head got worse  Onset of complaint: sunday Pain score: 7 Vitals:   05/22/19 0947  BP: (!) 144/73  Pulse: (!) 109  Resp: 18  Temp: 98.3 F (36.8 C)  SpO2: 100%     FHT:154 Lab orders placed from triage: UA

## 2019-05-30 ENCOUNTER — Other Ambulatory Visit: Payer: Self-pay

## 2019-05-30 ENCOUNTER — Ambulatory Visit: Payer: 59 | Admitting: Neurology

## 2019-05-30 ENCOUNTER — Encounter: Payer: Self-pay | Admitting: Neurology

## 2019-05-30 VITALS — BP 133/85 | HR 88 | Temp 98.0°F | Ht 70.0 in | Wt 264.0 lb

## 2019-05-30 DIAGNOSIS — E669 Obesity, unspecified: Secondary | ICD-10-CM

## 2019-05-30 DIAGNOSIS — R519 Headache, unspecified: Secondary | ICD-10-CM

## 2019-05-30 DIAGNOSIS — G43719 Chronic migraine without aura, intractable, without status migrainosus: Secondary | ICD-10-CM | POA: Diagnosis not present

## 2019-05-30 DIAGNOSIS — O132 Gestational [pregnancy-induced] hypertension without significant proteinuria, second trimester: Secondary | ICD-10-CM | POA: Diagnosis not present

## 2019-05-30 NOTE — Patient Instructions (Signed)
Unfortunately, I cannot think of any other symptomatic medication for migraine management that would be deemed safe in pregnancy, please talk to your OB about additional management of your nausea, perhaps Zofran is considered acceptable during pregnancy.  Once you have had the baby, we can consider other treatment options for acute migraine management and also for migraine prevention down the road.  For now, we will proceed with a sleep study to look for an underlying sleep disordered breathing.  Sleep apnea can cause recurrent headaches and also blood pressure elevations, this would be worth treating if you qualify.  We will proceed with testing and also call you with the results, for now, let's plan to follow up in 6 months.

## 2019-05-30 NOTE — Progress Notes (Addendum)
Subjective:    Patient ID: Chelsea Day is a 23 y.o. female.  HPI     Star Age, MD, PhD [facility] [address] [place], [place] [address]  Dear Dr. Nelda Marseille,   I saw your patient, Chelsea Day, upon your kind request in my neurologic clinic today for initial consultation of her recurrent headaches.  The patient is unaccompanied today.  As you know, Chelsea Day is a 23 year old right-handed woman with an underlying medical history of preeclampsia, pregnancy-induced hypertension in 2018, endometriosis, ovarian cysts, and obesity, currently pregnant in the second trimester, 18 weeks, who reports recurrent migrainous headaches since she became pregnant.  She has a previous diagnosis of pregnancy-induced hypertension and preeclampsia with her previous pregnancy.  She has throbbing headaches, these are often associated with nausea, and also photophobia.  Her mother has migraines.  She has tried Fioricet and Flexeril which did not help, Reglan has not been helpful, she found it too sedating.  Meclizine helps with dizziness.  She continues to take this as needed.  Her blood pressure has been better.  I reviewed your office note from 05/23/2019.  She has been to the MAU twice in the recent past.  She was started on labetalol this month.  She was also symptomatically treated with Stadol.  She denies any one-sided weakness or numbness or tingling or droopy face or slurring of speech, sometimes she has numbness and tingling in her hands and feet.  She has had no weakness.  She denies snoring.  She denies night to night nocturia but has woken up with a headache.  She is not aware of any family history of OSA.  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  She quit smoking in 2015 and does not currently drink any alcohol and drinks caffeine and limitation, 1 cup of coffee per  day on average.  Headaches are mostly right-sided.  She had an eye examination in February 2020, she has prescription eyeglasses.  Her Past Medical History Is Significant For: Past Medical History:  Diagnosis Date  . Bilateral ovarian cysts   . Endometriosis determined by laparoscopy   . History of postpartum uterine bleeding   . Irregular periods/menstrual cycles   . Pregnancy induced hypertension     Her Past Surgical History Is Significant For: Past Surgical History:  Procedure Laterality Date  . LAPAROSCOPIC OVARIAN CYSTECTOMY Right 01/24/2015   Procedure: LAPAROSCOPIC LYSIS OF ADHESION, LAPROSCOPIC RIGHT SALPINGOOOPHORECTOMY, BIOPSY OF RIGHT BOARD LIGAMENT; LYSIS BIOPSY OF ADHESIONS, BIOPSY OF LEFT UTERO LIGAMENT;  Surgeon: Ena Dawley, MD;  Location: [facility];  Service: Gynecology;  Laterality: Right;  . LAPAROSCOPY  11/08/2011   Procedure: LAPAROSCOPY OPERATIVE;  Surgeon: Olga Millers, MD;  Location: [facility];  Service: Gynecology;  Laterality: Right;  . OVARIAN CYST REMOVAL  11/08/2011   Procedure: OVARIAN CYSTECTOMY;  Surgeon: Olga Millers, MD;  Location: [facility];  Service: Gynecology;  Laterality: Right;  . UNILATERAL SALPINGECTOMY Right 01/24/15    Her Family History Is Significant For: Family History  Problem Relation Age of Onset  . Cancer Father        ???  . Multiple sclerosis Mother     Her Social History Is Significant For: Social History   Socioeconomic History  . Marital status: Married    Spouse name: Not on file  . Number of children: Not on file  . Years  of education: Not on file  . Highest education level: Not on file  Occupational History  . Not on file  Social Needs  . Financial resource strain: Not on file  . Food insecurity    Worry: Not on file    Inability: Not on file  . Transportation needs    Medical: Not on file    Non-medical: Not on file  Tobacco Use  . Smoking status: Former Smoker    Types: Cigarettes    Quit date: 11/30/2013     Years since quitting: 5.4  . Smokeless tobacco: Never Used  Substance and Sexual Activity  . Alcohol use: No    Alcohol/week: 0.0 standard drinks    Comment: social, not while pregnant  . Drug use: No  . Sexual activity: Yes  Lifestyle  . Physical activity    Days per week: Not on file    Minutes per session: Not on file  . Stress: Not on file  Relationships  . Social Herbalist on phone: Not on file    Gets together: Not on file    Attends religious service: Not on file    Active member of club or organization: Not on file    Attends meetings of clubs or organizations: Not on file    Relationship status: Not on file  Other Topics Concern  . Not on file  Social History Narrative  . Not on file    Her Allergies Are:  Allergies  Allergen Reactions  . Peanuts [Peanut Oil] Anaphylaxis and Hives    Allergic to all nuts  . Latex Rash  :   Her Current Medications Are:  Outpatient Encounter Medications as of 05/30/2019  Medication Sig  . acetaminophen (TYLENOL) 325 MG tablet Take 650 mg by mouth every 6 (six) hours as needed.  Marland Kitchen aspirin 81 MG chewable tablet Chew by mouth daily.  Marland Kitchen labetalol (NORMODYNE) 100 MG tablet Take 2 tablets (200 mg total) by mouth 2 (two) times daily.  . meclizine (ANTIVERT) 25 MG tablet Take 1 tablet (25 mg total) by mouth 3 (three) times daily as needed for dizziness or nausea.  . [DISCONTINUED] cyclobenzaprine (FLEXERIL) 10 MG tablet Take 1 tablet (10 mg total) by mouth 2 (two) times daily as needed for muscle spasms.  . [DISCONTINUED] metoCLOPramide (REGLAN) 10 MG tablet Take 1 tablet (10 mg total) by mouth every 8 (eight) hours as needed for nausea.   No facility-administered encounter medications on file as of 05/30/2019.   :  Review of Systems:  Out of a complete 14 point review of systems, all are reviewed and negative with the exception of these symptoms as listed below: Review of Systems  Neurological:       Pt presents today to  discuss her migraines. Pt has had a constant migraine since her pregnancy started. She has associated light and sound sensitivity and nausea/vomiting. Pt has never had a sleep study and does not endorse snoring.  Epworth Sleepiness Scale 0= would never doze 1= slight chance of dozing 2= moderate chance of dozing 3= high chance of dozing  Sitting and reading: 1 Watching TV: 2 Sitting inactive in a public place (ex. Theater or meeting): 0 As a passenger in a car for an hour without a break: 2 Lying down to rest in the afternoon: 3 Sitting and talking to someone: 0 Sitting quietly after lunch (no alcohol): 0 In a car, while stopped in traffic: 0 Total: 8  Objective:  Neurological Exam  Physical Exam Physical Examination:   Vitals:   05/30/19 1329  BP: 133/85  Pulse: 88  Temp: 98 F (36.7 C)    General Examination: The patient is a very pleasant 23 y.o. female in no acute distress. She appears well-developed and well-nourished and well groomed.   HEENT: Normocephalic, atraumatic, pupils are equal, round and reactive to light and accommodation. Funduscopic exam is normal with sharp disc margins noted. Extraocular tracking is good without limitation to gaze excursion or nystagmus noted. Normal smooth pursuit is noted. Hearing is grossly intact. She is mildly sensitive to light.  She wears corrective eyeglasses. Face is symmetric with normal facial animation and normal facial sensation. Speech is clear with no dysarthria noted. There is no hypophonia. There is no lip, neck/head, jaw or voice tremor. Neck is supple with full range of passive and active motion. There are no carotid bruits on auscultation. Oropharynx exam reveals: mild mouth dryness, good dental hygiene and mild airway crowding, due to Small airway entry, tonsils are small, Mallampati is class II, neck circumference is 16-1/2 inches.  Tongue protrudes centrally in palate elevates symmetrically.   Chest: Clear to  auscultation without wheezing, rhonchi or crackles noted.  Heart: S1+S2+0, regular and normal with A mild, 2 out of 6 systolic murmur noted.  This is not new, she reports that she was diagnosed with a heart murmur as a child.   Abdomen: Soft, non-tender and non-distended with normal bowel sounds appreciated on auscultation.  Extremities: There is no pitting edema in the distal lower extremities bilaterally. Pedal pulses are intact.  Skin: Warm and dry without trophic changes noted.  Musculoskeletal: exam reveals no obvious joint deformities, tenderness or joint swelling or erythema.   Neurologically:  Mental status: The patient is awake, alert and oriented in all 4 spheres. Her immediate and remote memory, attention, language skills and fund of knowledge are appropriate. There is no evidence of aphasia, agnosia, apraxia or anomia. Speech is clear with normal prosody and enunciation. Thought process is linear. Mood is normal and affect is normal.  Cranial nerves II - XII are as described above under HEENT exam. In addition: shoulder shrug is normal with equal shoulder height noted. Motor exam: Normal bulk, strength and tone is noted. There is no drift, tremor or rebound. Romberg is negative. Reflexes are 2+ throughout. Babinski: Toes are flexor bilaterally. Fine motor skills and coordination: intact with normal finger taps, normal hand movements, normal rapid alternating patting, normal foot taps and normal foot agility.  Cerebellar testing: No dysmetria or intention tremor on finger to nose testing. Heel to shin is unremarkable bilaterally. There is no truncal or gait ataxia.  Sensory exam: intact to light touch, vibration, temperature sense in the upper and lower extremities.  Gait, station and balance: She stands easily. No veering to one side is noted. No leaning to one side is noted. Posture is age-appropriate and stance is narrow based. Gait shows normal stride length and normal pace. No  problems turning are noted. Tandem walk is unremarkable.   Assessment and plan:  In summary, Corrin Hayhurst is a very pleasant 23 y.o.-year old female with an underlying medical history of preeclampsia, pregnancy-induced hypertension in 2018, endometriosis, ovarian cysts, and obesity, currently pregnant in the second trimester, 18 weeks, who Presents for evaluation of her recurrent headaches, she certainly does have a migrainous sounding headache.  She has tried and failed a few acute medications.  Unfortunately, there is not a whole lot  I can offer her that would be deemed safe in pregnancy.  We have a lot of options down the road for acute management and preventative treatments.  She is advised to follow-up routinely after her baby, in the interim, she is advised to talk to you about trying something else for nausea as the Metoclopramide made her too sleepy.  I would like to proceed with a sleep study to rule out underlying disordered breathing which would be a treatable condition and may help with headache and blood pressure management.  She is willing to proceed.  To that end, I will order a sleep study and we will call her to schedule this soon.  She would be willing to seek treatment for sleep apnea.  She is advised to stay well-hydrated and well rested.  She is advised to follow-up with you routinely and I plan to see her back after she has had her baby, sooner after her sleep study if needed.  I answered all her questions today and she was in agreement.  Thankfully, her neurological exam is nonfocal and She is reassured in that regard.   Thank you very much for allowing me to participate in the care of this nice patient. If I can be of any further assistance to you please do not hesitate to call me at [phone].  Sincerely,   Star Age, MD, PhD

## 2019-06-04 ENCOUNTER — Telehealth: Payer: Self-pay | Admitting: Neurology

## 2019-06-04 NOTE — Telephone Encounter (Signed)
I talked to Dr. Nelda Marseille, patient's OB/GYN regarding ongoing migraine headaches.  I personally have never recommended triptans for migraine management in a pregnant woman.  She is not sure what else to treat the patient with.  I suggested a cautious trial of narcotic pain medication can be considered.  Nevertheless, I suggested I also talk to my colleagues and see if anyone else has had experience with triptan's treating pregnancy related migraines. Will update her if I get recommendations from my colleagues.

## 2019-06-04 NOTE — Telephone Encounter (Signed)
Dr Angelia Mould has called for Dr Rexene Alberts to discuss the plan of care re: pt's migraines since pt is pregnant.  Dr Louann Liv can be reached at [phone]

## 2019-06-19 ENCOUNTER — Telehealth: Payer: Self-pay

## 2019-06-19 NOTE — Telephone Encounter (Signed)
Pt has spoken to her obgyn and she has advised against doing a sleep study. Pt does not want to schedule for sleep study at this time.

## 2019-07-11 ENCOUNTER — Other Ambulatory Visit: Payer: Self-pay

## 2019-07-11 ENCOUNTER — Inpatient Hospital Stay (HOSPITAL_COMMUNITY)
Admission: AD | Admit: 2019-07-11 | Discharge: 2019-07-12 | Disposition: A | Discharge status: Home / Self Care | Payer: 59 | Source: Ambulatory Visit | Attending: Obstetrics and Gynecology | Admitting: Obstetrics and Gynecology

## 2019-07-11 ENCOUNTER — Encounter (HOSPITAL_COMMUNITY): Payer: Self-pay

## 2019-07-11 DIAGNOSIS — O9A212 Injury, poisoning and certain other consequences of external causes complicating pregnancy, second trimester: Secondary | ICD-10-CM | POA: Insufficient documentation

## 2019-07-11 DIAGNOSIS — Z7982 Long term (current) use of aspirin: Secondary | ICD-10-CM | POA: Insufficient documentation

## 2019-07-11 DIAGNOSIS — Z79899 Other long term (current) drug therapy: Secondary | ICD-10-CM | POA: Insufficient documentation

## 2019-07-11 DIAGNOSIS — Z3689 Encounter for other specified antenatal screening: Secondary | ICD-10-CM

## 2019-07-11 DIAGNOSIS — W19XXXA Unspecified fall, initial encounter: Secondary | ICD-10-CM | POA: Insufficient documentation

## 2019-07-11 DIAGNOSIS — Z87891 Personal history of nicotine dependence: Secondary | ICD-10-CM | POA: Insufficient documentation

## 2019-07-11 DIAGNOSIS — Z3A25 25 weeks gestation of pregnancy: Secondary | ICD-10-CM | POA: Insufficient documentation

## 2019-07-11 DIAGNOSIS — S3991XA Unspecified injury of abdomen, initial encounter: Secondary | ICD-10-CM | POA: Insufficient documentation

## 2019-07-11 LAB — URINALYSIS, ROUTINE W REFLEX MICROSCOPIC
Bilirubin Urine: NEGATIVE
Glucose, UA: NEGATIVE mg/dL
Hgb urine dipstick: NEGATIVE
Ketones, ur: NEGATIVE mg/dL
Leukocytes,Ua: NEGATIVE
Nitrite: NEGATIVE
Protein, ur: NEGATIVE mg/dL
Specific Gravity, Urine: 1.019 (ref 1.005–1.030)
pH: 6 (ref 5.0–8.0)

## 2019-07-11 LAB — POCT FERN TEST: POCT Fern Test: NEGATIVE

## 2019-07-11 NOTE — MAU Provider Note (Signed)
History     CSN: [id number]  Arrival date and time: 07/11/19 2221   First Provider Initiated Contact with Patient 07/11/19 2313      Chief Complaint  Patient presents with  . Fall   Chelsea Day is a 23 y.o. G3P1011 at [redacted]w[redacted]d who receives care at [facility].  She presents today for Fall that occurred around 9pm.  She states that she fell on her abdomen after tripping over the dog.  She states she is having pressure in her pelvis and back stating that "the back is worse than the front."  She states the pressure started immediately after falling and is constant.  She denies vaginal bleeding or discharge, but states that "after it felt like I was peeing myself a lot," but is unsure of whether she did urinate.  She reports that the fluid did not have a smell, but looked yellow.  She states she continued to have fluid upon arrival.   Patient endorses fetal movement and denies contractions.  She states that she has not taken anything for the pain. Patient goes on to express concern regarding her fall as she states she fell because the dog was biting her.  She requests rabies testing stating that the dog is 3 months overdue for its vaccine.  However, patient denies the dog being exposed to any other animals with rabies and has no symptoms.      OB History    Gravida  3   Para  1   Term  1   Preterm      AB  1   Living  1     SAB  1   TAB      Ectopic      Multiple  0   Live Births  1           Past Medical History:  Diagnosis Date  . Bilateral ovarian cysts   . Endometriosis determined by laparoscopy   . History of postpartum uterine bleeding   . Irregular periods/menstrual cycles   . Pregnancy induced hypertension     Past Surgical History:  Procedure Laterality Date  . LAPAROSCOPIC OVARIAN CYSTECTOMY Right 01/24/2015   Procedure: LAPAROSCOPIC LYSIS OF ADHESION, LAPROSCOPIC RIGHT SALPINGOOOPHORECTOMY, BIOPSY OF RIGHT BOARD LIGAMENT; LYSIS BIOPSY OF ADHESIONS,  BIOPSY OF LEFT UTERO LIGAMENT;  Surgeon: Ena Dawley, MD;  Location: [facility];  Service: Gynecology;  Laterality: Right;  . LAPAROSCOPY  11/08/2011   Procedure: LAPAROSCOPY OPERATIVE;  Surgeon: Olga Millers, MD;  Location: [place] [facility];  Service: Gynecology;  Laterality: Right;  . OVARIAN CYST REMOVAL  11/08/2011   Procedure: OVARIAN CYSTECTOMY;  Surgeon: Olga Millers, MD;  Location: [place] [facility];  Service: Gynecology;  Laterality: Right;  . UNILATERAL SALPINGECTOMY Right 01/24/15    Family History  Problem Relation Age of Onset  . Cancer Father        ???  . Multiple sclerosis Mother     Social History   Tobacco Use  . Smoking status: Former Smoker    Types: Cigarettes    Quit date: 11/30/2013    Years since quitting: 5.6  . Smokeless tobacco: Never Used  Substance Use Topics  . Alcohol use: No    Alcohol/week: 0.0 standard drinks    Comment: social, not while pregnant  . Drug use: No    Allergies:  Allergies  Allergen Reactions  . Peanuts [Peanut Oil] Anaphylaxis and Hives    Allergic to all nuts  . Latex Rash  Medications Prior to Admission  Medication Sig Dispense Refill Last Dose  . acetaminophen (TYLENOL) 325 MG tablet Take 650 mg by mouth every 6 (six) hours as needed.     Marland Kitchen aspirin 81 MG chewable tablet Chew by mouth daily.     Marland Kitchen labetalol (NORMODYNE) 100 MG tablet Take 2 tablets (200 mg total) by mouth 2 (two) times daily. 60 tablet 1   . meclizine (ANTIVERT) 25 MG tablet Take 1 tablet (25 mg total) by mouth 3 (three) times daily as needed for dizziness or nausea. 30 tablet 0     Review of Systems  Constitutional: Negative for chills and fever.  Respiratory: Negative for cough and shortness of breath.   Gastrointestinal: Negative for abdominal pain, nausea and vomiting.  Genitourinary: Positive for pelvic pain and vaginal discharge (Fluid). Negative for difficulty urinating, dysuria and vaginal bleeding.  Musculoskeletal: Positive for back pain (Pressure).   Neurological: Positive for headaches (Reports "normal"). Negative for dizziness and light-headedness.   Physical Exam   Blood pressure 135/76, pulse 100, temperature 98 F (36.7 C), resp. rate (!) 21, weight 123.8 kg, last menstrual period 04/30/2018, unknown if currently breastfeeding.  Physical Exam  Vitals reviewed. Constitutional: She is oriented to person, place, and time.  Obese  HENT:  Head: Normocephalic and atraumatic.  Eyes: Conjunctivae are normal.  Neck: Normal range of motion.  Cardiovascular: Normal rate, regular rhythm and normal heart sounds.  Respiratory: Effort normal.  Genitourinary:    Vaginal discharge present.     No vaginal bleeding.  No bleeding in the vagina.    Genitourinary Comments: Speculum Exam: -Normal External Genitalia: Non tender, no apparent discharge at introitus.  -Vaginal Vault: Pink mucosa with good rugae. No pooling or apparent discharge in vault - Fern collected -Cervix:Pink, no lesions, cysts, or polyps.  Appears closed. No active bleeding, discharge, or fluid from os-No leaking with valsalva maneuver.  -Bimanual Exam:  Deferred    Musculoskeletal: Normal range of motion.  Neurological: She is alert and oriented to person, place, and time.  Skin: Skin is warm and dry.  Psychiatric: She has a normal mood and affect. Her behavior is normal.    Fetal Assessment  145 bpm, Mod Var, -Decels, +10x10 Accels Toco: None graphed  MAU Course   Results for orders placed or performed during the hospital encounter of 07/11/19 (from the past 24 hour(s))  Urinalysis, Routine w reflex microscopic     Status: Abnormal   Collection Time: 07/11/19 11:07 PM  Result Value Ref Range   Color, Urine YELLOW YELLOW   APPearance HAZY (A) CLEAR   Specific Gravity, Urine 1.019 1.005 - 1.030   pH 6.0 5.0 - 8.0   Glucose, UA NEGATIVE NEGATIVE mg/dL   Hgb urine dipstick NEGATIVE NEGATIVE   Bilirubin Urine NEGATIVE NEGATIVE   Ketones, ur NEGATIVE NEGATIVE  mg/dL   Protein, ur NEGATIVE NEGATIVE mg/dL   Nitrite NEGATIVE NEGATIVE   Leukocytes,Ua NEGATIVE NEGATIVE   No results found.  MDM PE Labs:UA, Fern EFM Assessment and Plan  23 year old G3P1011  SIUP at 25 weeks Cat I FT Abdominal Trauma s/t Fall  -Patient informed that standardized testing is not indicated for diagnosis of rabies. -POC discussed. -Exam findings discussed. -Patient offered and declined pain medication. -Fern Negative -Will monitor x 4 hours. -Patient and husband without questions or concerns. -NST reactive.  Maryann Conners MSN, CNM 07/11/2019, 11:13 PM   Reassessment (12:45 AM) 155 bpm, Mod Var, +Variable Decels, +Accels  Overall reassuring  Will continue to monitor  Reassessment (2:13 AM)-Late Entry d/t Downtime 125 bpm, Mod Var, -Decels, +15x15 Accels  Reactive  Reassessment (3:10 AM) 130 bpm, Mod Var, -Decels, +Accels  -Reactive NST -Discussed reassuring fetal monitoring -PTL precautions given -Instructed to keep appt as scheduled for Friday Dec 11th -Patient and husband without questions or concerns. -Encouraged to call or return to MAU if symptoms worsen or with the onset of new symptoms. -Discharged to home in stable condition.  Maryann Conners MSN, CNM Advanced Practice Provider, [facility] [employer] [facility]

## 2019-07-11 NOTE — Progress Notes (Signed)
Fern slide made

## 2019-07-11 NOTE — MAU Note (Signed)
Patient states she was bit by her dog around 2100 and when trying to get away from him fell directly on her abdomen.  Reports she is now feeling a lot of pelvic pain/presssure.  States she also feels like she's constantly voiding on herself.  No VB.  Endorses + FM since the fall.

## 2019-07-12 DIAGNOSIS — Z79899 Other long term (current) drug therapy: Secondary | ICD-10-CM | POA: Diagnosis not present

## 2019-07-12 DIAGNOSIS — W19XXXA Unspecified fall, initial encounter: Secondary | ICD-10-CM | POA: Diagnosis not present

## 2019-07-12 DIAGNOSIS — S3991XA Unspecified injury of abdomen, initial encounter: Secondary | ICD-10-CM | POA: Diagnosis not present

## 2019-07-12 DIAGNOSIS — O9A212 Injury, poisoning and certain other consequences of external causes complicating pregnancy, second trimester: Secondary | ICD-10-CM | POA: Diagnosis present

## 2019-07-12 DIAGNOSIS — Z87891 Personal history of nicotine dependence: Secondary | ICD-10-CM | POA: Diagnosis not present

## 2019-07-12 DIAGNOSIS — Z7982 Long term (current) use of aspirin: Secondary | ICD-10-CM | POA: Diagnosis not present

## 2019-07-12 DIAGNOSIS — Z3A25 25 weeks gestation of pregnancy: Secondary | ICD-10-CM | POA: Diagnosis not present

## 2019-07-23 ENCOUNTER — Other Ambulatory Visit: Payer: Self-pay

## 2019-07-23 ENCOUNTER — Encounter (HOSPITAL_COMMUNITY): Payer: Self-pay | Admitting: Obstetrics and Gynecology

## 2019-07-23 ENCOUNTER — Inpatient Hospital Stay (HOSPITAL_COMMUNITY)
Admission: AD | Admit: 2019-07-23 | Discharge: 2019-07-23 | Disposition: A | Discharge status: Home / Self Care | Payer: 59 | Attending: Obstetrics and Gynecology | Admitting: Obstetrics and Gynecology

## 2019-07-23 DIAGNOSIS — Z91010 Allergy to peanuts: Secondary | ICD-10-CM | POA: Insufficient documentation

## 2019-07-23 DIAGNOSIS — Z7982 Long term (current) use of aspirin: Secondary | ICD-10-CM | POA: Diagnosis not present

## 2019-07-23 DIAGNOSIS — O99891 Other specified diseases and conditions complicating pregnancy: Secondary | ICD-10-CM | POA: Diagnosis not present

## 2019-07-23 DIAGNOSIS — R112 Nausea with vomiting, unspecified: Secondary | ICD-10-CM

## 2019-07-23 DIAGNOSIS — M7989 Other specified soft tissue disorders: Secondary | ICD-10-CM | POA: Diagnosis not present

## 2019-07-23 DIAGNOSIS — O1202 Gestational edema, second trimester: Secondary | ICD-10-CM

## 2019-07-23 DIAGNOSIS — R519 Headache, unspecified: Secondary | ICD-10-CM

## 2019-07-23 DIAGNOSIS — H538 Other visual disturbances: Secondary | ICD-10-CM | POA: Diagnosis not present

## 2019-07-23 DIAGNOSIS — Z91040 Latex allergy status: Secondary | ICD-10-CM | POA: Diagnosis not present

## 2019-07-23 DIAGNOSIS — Z79899 Other long term (current) drug therapy: Secondary | ICD-10-CM | POA: Diagnosis not present

## 2019-07-23 DIAGNOSIS — O26892 Other specified pregnancy related conditions, second trimester: Secondary | ICD-10-CM | POA: Insufficient documentation

## 2019-07-23 DIAGNOSIS — Z87891 Personal history of nicotine dependence: Secondary | ICD-10-CM | POA: Insufficient documentation

## 2019-07-23 DIAGNOSIS — Z820 Family history of epilepsy and other diseases of the nervous system: Secondary | ICD-10-CM | POA: Insufficient documentation

## 2019-07-23 DIAGNOSIS — Z3A26 26 weeks gestation of pregnancy: Secondary | ICD-10-CM | POA: Diagnosis not present

## 2019-07-23 DIAGNOSIS — O219 Vomiting of pregnancy, unspecified: Secondary | ICD-10-CM | POA: Diagnosis not present

## 2019-07-23 LAB — COMPREHENSIVE METABOLIC PANEL
ALT: 15 U/L (ref 0–44)
AST: 13 U/L — ABNORMAL LOW (ref 15–41)
Albumin: 3.1 g/dL — ABNORMAL LOW (ref 3.5–5.0)
Alkaline Phosphatase: 91 U/L (ref 38–126)
Anion gap: 10 (ref 5–15)
BUN: 5 mg/dL — ABNORMAL LOW (ref 6–20)
CO2: 22 mmol/L (ref 22–32)
Calcium: 9.2 mg/dL (ref 8.9–10.3)
Chloride: 105 mmol/L (ref 98–111)
Creatinine, Ser: 0.77 mg/dL (ref 0.44–1.00)
GFR calc Af Amer: >60 mL/min (ref >60)
GFR calc non Af Amer: >60 mL/min (ref >60)
Glucose, Bld: 95 mg/dL (ref 70–99)
Potassium: 3.6 mmol/L (ref 3.5–5.1)
Sodium: 137 mmol/L (ref 135–145)
Total Bilirubin: 0.3 mg/dL (ref 0.3–1.2)
Total Protein: 6.6 g/dL (ref 6.5–8.1)

## 2019-07-23 LAB — CBC WITH DIFFERENTIAL/PLATELET
Abs Immature Granulocytes: 0.06 10*3/uL (ref 0.00–0.07)
Basophils Absolute: 0.1 10*3/uL (ref 0.0–0.1)
Basophils Relative: 0 %
Eosinophils Absolute: 0.1 10*3/uL (ref 0.0–0.5)
Eosinophils Relative: 1 %
HCT: 34.8 % — ABNORMAL LOW (ref 36.0–46.0)
Hemoglobin: 11.8 g/dL — ABNORMAL LOW (ref 12.0–15.0)
Immature Granulocytes: 0 %
Lymphocytes Relative: 25 %
Lymphs Abs: 4.2 10*3/uL — ABNORMAL HIGH (ref 0.7–4.0)
MCH: 29.1 pg (ref 26.0–34.0)
MCHC: 33.9 g/dL (ref 30.0–36.0)
MCV: 85.7 fL (ref 80.0–100.0)
Monocytes Absolute: 1 10*3/uL (ref 0.1–1.0)
Monocytes Relative: 6 %
Neutro Abs: 11.2 10*3/uL — ABNORMAL HIGH (ref 1.7–7.7)
Neutrophils Relative %: 68 %
Platelets: 315 10*3/uL (ref 150–400)
RBC: 4.06 MIL/uL (ref 3.87–5.11)
RDW: 13.1 % (ref 11.5–15.5)
WBC: 16.5 10*3/uL — ABNORMAL HIGH (ref 4.0–10.5)
nRBC: 0 % (ref 0.0–0.2)

## 2019-07-23 LAB — PROTEIN / CREATININE RATIO, URINE
Creatinine, Urine: 101.67 mg/dL
Protein Creatinine Ratio: 0.08 mg/mg{Cre} (ref 0.00–0.15)
Total Protein, Urine: 8 mg/dL

## 2019-07-23 MED ORDER — DIPHENHYDRAMINE HCL 50 MG/ML IJ SOLN
12.5000 mg | Freq: Once | INTRAMUSCULAR | Status: AC
  Administered 2019-07-23: 19:00:00 12.5 mg via INTRAVENOUS
  Filled 2019-07-23: qty 1

## 2019-07-23 MED ORDER — ACETAMINOPHEN 325 MG PO TABS: 1000.0000 mg | ORAL_TABLET | Freq: Four times a day (QID) | ORAL | 1 refills | Status: DC | PRN

## 2019-07-23 MED ORDER — LABETALOL HCL 5 MG/ML IV SOLN: 20.0000 mg | INTRAVENOUS | Status: DC | PRN

## 2019-07-23 MED ORDER — METOCLOPRAMIDE HCL 10 MG PO TABS: 10.0000 mg | ORAL_TABLET | Freq: Three times a day (TID) | ORAL | 1 refills | Status: DC | PRN

## 2019-07-23 MED ORDER — DEXAMETHASONE SODIUM PHOSPHATE 10 MG/ML IJ SOLN
10.0000 mg | Freq: Once | INTRAMUSCULAR | Status: AC
  Administered 2019-07-23: 19:00:00 10 mg via INTRAVENOUS
  Filled 2019-07-23: qty 1

## 2019-07-23 MED ORDER — BUPIVACAINE HCL (PF) 0.5 % IJ SOLN
30.0000 mL | Freq: Once | INTRAMUSCULAR | Status: AC
  Administered 2019-07-23: 21:00:00 30 mL
  Filled 2019-07-23: qty 30

## 2019-07-23 MED ORDER — METOCLOPRAMIDE HCL 5 MG/ML IJ SOLN
10.0000 mg | Freq: Once | INTRAMUSCULAR | Status: AC
  Administered 2019-07-23: 19:00:00 10 mg via INTRAVENOUS
  Filled 2019-07-23: qty 2

## 2019-07-23 MED ORDER — LABETALOL HCL 5 MG/ML IV SOLN: 80.0000 mg | INTRAVENOUS | Status: DC | PRN

## 2019-07-23 MED ORDER — CYCLOBENZAPRINE HCL 10 MG PO TABS: 10.0000 mg | ORAL_TABLET | Freq: Three times a day (TID) | ORAL | 0 refills | Status: DC | PRN

## 2019-07-23 MED ORDER — LABETALOL HCL 5 MG/ML IV SOLN: 40.0000 mg | INTRAVENOUS | Status: DC | PRN

## 2019-07-23 MED ORDER — HYDRALAZINE HCL 20 MG/ML IJ SOLN: 10.0000 mg | INTRAMUSCULAR | Status: DC | PRN

## 2019-07-23 NOTE — MAU Provider Note (Signed)
History     CSN: [id number]  Arrival date and time: 07/23/19 1709   First Provider Initiated Contact with Patient 07/23/19 1804      Chief Complaint  Patient presents with  . Hypertension  . Headache  . Emesis  . Nausea   [name] is a 23 y.o. G3P1011 at [redacted]w[redacted]d who presents to MAU for preeclampsia evaluation after pt started experiencing a HA that started on Saturday and has not gone away with home treatment. Pt reports N/V started today along blurry vision in both eyes, mainly the left eye. Pt denies history of migraine HA. Pt reports 8/10 HA pain. Pt last took Tylenol 1000mg  at 4PM. Pt reports on-going HA since 20 weeks of pregnancy, that worsened since Saturday. Pt reports HA has been on-and-off since 20 weeks of pregnancy.  Pt reports her OB prescribed her Fioricet around 20wks and reports taking every day for a week. Pt reports she stopped, but the HA came back and would only stop if she was taking the Fioricet. Pt also saw a neurologist around 24weeks and was told there were no problems and she recommended continued use of Fioricet, but OB recommended she stop taking it. Pt reports taking Vitamin B6 100mg  and magnesium 250mg  daily.  Pt also reports swelling from the knees down for the past two weeks, and patient also reporting hand swelling for past two weeks and cannot wear her wedding. Pt reports swelling like this with her first pregnancy, but not until much later when she got diagnosed with preeclampsia.  Pt reports her BP was elevated at home in severe range, but also reports her BP goes up with severe pain.  Pt reports hx of preeclampsia necessitating delivery at 37wks with last pregnancy.  Pt denies seeing spots, epigastric pain, swelling in face and hands, sudden weight gain. Pt denies chest pain and SOB. Pt denies known exposures to [place].  Pt denies constipation, diarrhea, or urinary problems. Pt denies fever, chills, fatigue, sweating or changes in  appetite. Pt denies dizziness, light-headedness, weakness.  Pt denies VB, ctx, LOF.  Current pregnancy problems? HA, hx of preeclampsia Blood Type? A Positive Allergies? Peanuts, latex Current medications? 200mg  labetalol BID (pt reports she was put on labetalol after 20wks of pregnancy because when she would get HA her blood pressure would be high and she was put on labetalol to attempt to control her HA, pt reports it has not been working, last took at 6AM today) 81mg  ASA daily Current PNC & next appt? Dr. Freada [facility], 08/07/2018   OB History    Gravida  3   Para  1   Term  1   Preterm      AB  1   Living  1     SAB  1   TAB      Ectopic      Multiple  0   Live Births  1           Past Medical History:  Diagnosis Date  . Bilateral ovarian cysts   . Endometriosis determined by laparoscopy   . History of postpartum uterine bleeding   . Irregular periods/menstrual cycles   . Pregnancy induced hypertension     Past Surgical History:  Procedure Laterality Date  . LAPAROSCOPIC OVARIAN CYSTECTOMY Right 01/24/2015   Procedure: LAPAROSCOPIC LYSIS OF ADHESION, LAPROSCOPIC RIGHT SALPINGOOOPHORECTOMY, BIOPSY OF RIGHT BOARD LIGAMENT; LYSIS BIOPSY OF ADHESIONS, BIOPSY OF LEFT UTERO LIGAMENT;  Surgeon: Ena Dawley, MD;  Location:  [facility];  Service: Gynecology;  Laterality: Right;  . LAPAROSCOPY  11/08/2011   Procedure: LAPAROSCOPY OPERATIVE;  Surgeon: Olga Millers, MD;  Location: [facility];  Service: Gynecology;  Laterality: Right;  . OVARIAN CYST REMOVAL  11/08/2011   Procedure: OVARIAN CYSTECTOMY;  Surgeon: Olga Millers, MD;  Location: [facility] [facility];  Service: Gynecology;  Laterality: Right;  . UNILATERAL SALPINGECTOMY Right 01/24/15    Family History  Problem Relation Age of Onset  . Cancer Father        ???  . Multiple sclerosis Mother     Social History   Tobacco Use  . Smoking status: Former Smoker    Types: Cigarettes    Quit date: 11/30/2013    Years  since quitting: 5.6  . Smokeless tobacco: Never Used  Substance Use Topics  . Alcohol use: No    Alcohol/week: 0.0 standard drinks    Comment: social, not while pregnant  . Drug use: No    Allergies:  Allergies  Allergen Reactions  . Peanuts [Peanut Oil] Anaphylaxis and Hives    Allergic to all nuts  . Latex Rash    Medications Prior to Admission  Medication Sig Dispense Refill Last Dose  . aspirin 81 MG chewable tablet Chew by mouth daily.   07/23/2019 at Unknown time  . labetalol (NORMODYNE) 100 MG tablet Take 2 tablets (200 mg total) by mouth 2 (two) times daily. 60 tablet 1 07/23/2019 at Unknown time  . Magnesium Citrate ([employer]S MAGNESIUM CITRATE) 125 MG CAPS Take 250 mg by mouth daily.     . vitamin B-6 (PYRIDOXINE) 25 MG tablet Take 25 mg by mouth daily.     . meclizine (ANTIVERT) 25 MG tablet Take 1 tablet (25 mg total) by mouth 3 (three) times daily as needed for dizziness or nausea. 30 tablet 0   . [DISCONTINUED] acetaminophen (TYLENOL) 325 MG tablet Take 650 mg by mouth every 6 (six) hours as needed.       Review of Systems  Constitutional: Negative for chills, diaphoresis, fatigue and fever.  Eyes: Positive for visual disturbance.  Respiratory: Negative for shortness of breath.   Cardiovascular: Negative for chest pain.  Gastrointestinal: Positive for nausea and vomiting. Negative for abdominal pain, constipation and diarrhea.  Genitourinary: Negative for dysuria, flank pain, frequency, pelvic pain, urgency, vaginal bleeding and vaginal discharge.  Musculoskeletal:       Bilateral swelling lower extremities.  Neurological: Positive for headaches. Negative for dizziness, weakness and light-headedness.   Physical Exam   Blood pressure 116/89, pulse 97, temperature 98.4 F (36.9 C), temperature source Oral, resp. rate 20, height 5\' 10"  (1.778 m), weight 124.5 kg, last menstrual period 04/30/2018, SpO2 100 %, unknown if currently breastfeeding.  Patient Vitals for the  past 24 hrs:  BP Temp Temp src Pulse Resp SpO2 Height Weight  07/23/19 2031 116/89 -- -- 97 -- -- -- --  07/23/19 2016 102/70 -- -- 95 -- -- -- --  07/23/19 2001 (!) 116/91 -- -- 98 -- -- -- --  07/23/19 1946 126/63 -- -- 94 -- -- -- --  07/23/19 1931 (!) 111/55 -- -- 93 -- -- -- --  07/23/19 1916 114/80 -- -- (!) 102 -- -- -- --  07/23/19 1901 128/60 -- -- (!) 113 -- -- -- --  07/23/19 1850 -- -- -- (!) 115 -- -- -- --  07/23/19 1846 133/75 -- -- (!) 134 -- -- -- --  07/23/19 1831 140/84 -- -- (!) 130 -- -- -- --  07/23/19 1816 132/77 -- -- (!) 124 -- -- -- --  07/23/19 1801 125/73 -- -- (!) 115 -- -- -- --  07/23/19 1753 (!) 141/79 -- -- (!) 128 -- -- -- --  07/23/19 1727 (!) 165/88 98.4 F (36.9 C) Oral (!) 108 20 100 % 5\' 10"  (1.778 m) 124.5 kg   Physical Exam  Constitutional: She is oriented to person, place, and time. She appears well-developed and well-nourished. No distress.  HENT:  Head: Normocephalic and atraumatic.  Respiratory: Effort normal.  GI: Soft. She exhibits no distension and no mass. There is no abdominal tenderness. There is no rebound and no guarding.  Musculoskeletal:     Comments: Bilateral lower extremity swelling with mild pitting edema.  Neurological: She is alert and oriented to person, place, and time.  Skin: Skin is warm and dry. She is not diaphoretic.  Psychiatric: She has a normal mood and affect. Her behavior is normal. Judgment and thought content normal.   Results for orders placed or performed during the hospital encounter of 07/23/19 (from the past 24 hour(s))  CBC with Differential/Platelet     Status: Abnormal   Collection Time: 07/23/19  5:45 PM  Result Value Ref Range   WBC 16.5 (H) 4.0 - 10.5 K/uL   RBC 4.06 3.87 - 5.11 MIL/uL   Hemoglobin 11.8 (L) 12.0 - 15.0 g/dL   HCT 34.8 (L) 36.0 - 46.0 %   MCV 85.7 80.0 - 100.0 fL   MCH 29.1 26.0 - 34.0 pg   MCHC 33.9 30.0 - 36.0 g/dL   RDW 13.1 11.5 - 15.5 %   Platelets 315 150 - 400 K/uL    nRBC 0.0 0.0 - 0.2 %   Neutrophils Relative % 68 %   Neutro Abs 11.2 (H) 1.7 - 7.7 K/uL   Lymphocytes Relative 25 %   Lymphs Abs 4.2 (H) 0.7 - 4.0 K/uL   Monocytes Relative 6 %   Monocytes Absolute 1.0 0.1 - 1.0 K/uL   Eosinophils Relative 1 %   Eosinophils Absolute 0.1 0.0 - 0.5 K/uL   Basophils Relative 0 %   Basophils Absolute 0.1 0.0 - 0.1 K/uL   Immature Granulocytes 0 %   Abs Immature Granulocytes 0.06 0.00 - 0.07 K/uL  Comprehensive metabolic panel     Status: Abnormal   Collection Time: 07/23/19  5:45 PM  Result Value Ref Range   Sodium 137 135 - 145 mmol/L   Potassium 3.6 3.5 - 5.1 mmol/L   Chloride 105 98 - 111 mmol/L   CO2 22 22 - 32 mmol/L   Glucose, Bld 95 70 - 99 mg/dL   BUN 5 (L) 6 - 20 mg/dL   Creatinine, Ser 0.77 0.44 - 1.00 mg/dL   Calcium 9.2 8.9 - 10.3 mg/dL   Total Protein 6.6 6.5 - 8.1 g/dL   Albumin 3.1 (L) 3.5 - 5.0 g/dL   AST 13 (L) 15 - 41 U/L   ALT 15 0 - 44 U/L   Alkaline Phosphatase 91 38 - 126 U/L   Total Bilirubin 0.3 0.3 - 1.2 mg/dL   GFR calc non Af Amer >60 >60 mL/min   GFR calc Af Amer >60 >60 mL/min   Anion gap 10 5 - 15  Protein / creatinine ratio, urine     Status: None   Collection Time: 07/23/19  7:40 PM  Result Value Ref Range   Creatinine, Urine 101.67 mg/dL   Total Protein, Urine 8 mg/dL   Protein Creatinine Ratio  0.08 0.00 - 0.15 mg/mg[Cre]   MAU Course  Procedures  MDM -preeclampsia vs. Fioricet MOH vs. Migraine -hx of severe preeclampsia -CBC w/ Diff: WNL, platelets 315 -CMP: WNL AST/ALT 13/15, serum creatinine 0.77 (was 0.58 2 months ago) -PCr: 0.08 -HA cocktail given without fluid bolus d/t swelling -after cocktail, pt reports HA now 5/10 -pt reports she has not eaten or had anything to drink since 1PM today, will give juice and graham crackers and reassess after 76min -Dr. Elonda Husky to bedside to see patient, will do trigger point injections with bupivicaine -after food, pt reports HA now 3/10 -EFM: reactive        -baseline: wandering       -variability: moderate       -accels: present       -decels: absent       -TOCO: no ctx -pt discharged to home in stable condition  Orders Placed This Encounter  Procedures  . CBC with Differential/Platelet    Standing Status:   Standing    Number of Occurrences:   1  . Comprehensive metabolic panel    Standing Status:   Standing    Number of Occurrences:   1  . Protein / creatinine ratio, urine    Standing Status:   Standing    Number of Occurrences:   1  . AMB referral to headache clinic    Referral Priority:   Urgent    Referral Type:   Consultation    Number of Visits Requested:   1  . Notify Physician    Confirmatory reading of BP> 160/110 15 minutes later    Standing Status:   Standing    Number of Occurrences:   1    Order Specific Question:   Notify Physician    Answer:   Temp greater than or equal to 100.4    Order Specific Question:   Notify Physician    Answer:   RR greater than 24 or less than 10    Order Specific Question:   Notify Physician    Answer:   HR greater than 120 or less than 50    Order Specific Question:   Notify Physician    Answer:   SBP greater than 160 mmHG or less than 80 mmHG    Order Specific Question:   Notify Physician    Answer:   DBP greater than 110 mmHG or less than 45 mmHG    Order Specific Question:   Notify Physician    Answer:   Urinary output is less than 19ml for any 4 hour period  . Measure blood pressure    20 minutes after giving hydralazine 10 MG IV dose.  Call MD if SBP >/= 160 or DBP >/= 110.    Standing Status:   Standing    Number of Occurrences:   1  . Insert peripheral IV    Standing Status:   Standing    Number of Occurrences:   1  . Discharge patient    Order Specific Question:   Discharge disposition    Answer:   01-Home or Self Care [1]    Order Specific Question:   Discharge patient date    Answer:   07/23/2019   Meds ordered this encounter  Medications  . AND Linked Order  Group   . labetalol (NORMODYNE) injection 20 mg   . labetalol (NORMODYNE) injection 40 mg   . labetalol (NORMODYNE) injection 80 mg   . hydrALAZINE (APRESOLINE) injection 10  mg  . diphenhydrAMINE (BENADRYL) injection 12.5 mg  . metoCLOPramide (REGLAN) injection 10 mg  . dexamethasone (DECADRON) injection 10 mg  . bupivacaine (MARCAINE) 0.5 % injection 30 mL    Dr. Elonda Husky to do trigger point injections of neck.  Marland Kitchen acetaminophen (TYLENOL) 325 MG tablet    Sig: Take 3 tablets (975 mg total) by mouth every 6 (six) hours as needed for headache.    Dispense:  30 tablet    Refill:  1    Order Specific Question:   Supervising Provider    Answer:   ERVIN, MICHAEL L [[id number]]  . metoCLOPramide (REGLAN) 10 MG tablet    Sig: Take 1 tablet (10 mg total) by mouth every 8 (eight) hours as needed.    Dispense:  90 tablet    Refill:  1    Order Specific Question:   Supervising Provider    Answer:   ERVIN, MICHAEL L [[id number]]  . cyclobenzaprine (FLEXERIL) 10 MG tablet    Sig: Take 1 tablet (10 mg total) by mouth 3 (three) times daily as needed.    Dispense:  30 tablet    Refill:  0    Order Specific Question:   Supervising Provider    Answer:   Rip Harbour, MICHAEL L [[id number]]   Assessment and Plan   1. Pregnancy headache in second trimester   2. Nausea and vomiting, intractability of vomiting not specified, unspecified vomiting type   3. Blurry vision   4. Swelling of lower extremity during pregnancy in second trimester    Allergies as of 07/23/2019      Reactions   Peanuts [peanut Oil] Anaphylaxis, Hives   Allergic to all nuts   Latex Rash      Medication List    STOP taking these medications   [employer] Magnesium Citrate 125 MG Caps Generic drug: Magnesium Citrate   meclizine 25 MG tablet Commonly known as: ANTIVERT   vitamin B-6 25 MG tablet Commonly known as: pyridOXINE     TAKE these medications   acetaminophen 325 MG tablet Commonly known as: TYLENOL Take 3 tablets (975 mg total) by mouth  every 6 (six) hours as needed for headache. What changed:   how much to take  reasons to take this   aspirin 81 MG chewable tablet Chew by mouth daily.   cyclobenzaprine 10 MG tablet Commonly known as: FLEXERIL Take 1 tablet (10 mg total) by mouth 3 (three) times daily as needed.   labetalol 100 MG tablet Commonly known as: NORMODYNE Take 2 tablets (200 mg total) by mouth 2 (two) times daily.   metoCLOPramide 10 MG tablet Commonly known as: REGLAN Take 1 tablet (10 mg total) by mouth every 8 (eight) hours as needed.      -discussed rebound HA after extended use of Fioricet  For prevention of migraines in pregnancy: -Magnesium, 400mg  by mouth, once daily -Vitamin B2, 400mg  by mouth, once daily  For treatment of migraines in pregnancy: -take medication at the first sign of the pain of a headache, or the first sign of your aura -start with 1000mg  Tylenol (do not exceed 4000mg  of Tylenol in 24hrs), with or without Reglan 10mg  -if no relief after 1-2hours, can take Flexeril 10mg , or can start with this -if the above regimen does not resolve your headache at all, please come to MAU for additional treatment  -RX Tylenol -RX Reglan -RX Flexeril -referral to [place] clinic -return MAU precautions given -pt discharged to home in stable  [place] E  Brodin Gelpi 07/23/2019, 9:04 PM

## 2019-07-23 NOTE — Discharge Instructions (Signed)
For prevention of migraines in pregnancy: -Magnesium, 400mg  by mouth, once daily -Vitamin B2, 400mg  by mouth, once daily  For treatment of migraines in pregnancy: -take medication at the first sign of the pain of a headache, or the first sign of your aura -start with 1000mg  Tylenol (do not exceed 4000mg  of Tylenol in 24hrs), with or without Reglan 10mg  -if no relief after 1-2hours, can take Flexeril 10mg , or can start with this -if the above regimen does not resolve your headache at all, please come to [facility] for additional treatment    Analgesic Rebound Headache An analgesic rebound headache, sometimes called a medication overuse headache, is a headache that comes after pain medicine (analgesic) taken to treat the original (primary) headache has worn off. Any type of primary headache can return as a rebound headache if a person regularly takes analgesics more than three times a week to treat it. The types of primary headaches that are commonly associated with rebound headaches include:  Migraines.  Headaches that arise from tense muscles in the head and neck area (tension headaches).  Headaches that develop and happen again (recur) on one side of the head and around the eye (cluster headaches). If rebound headaches continue, they become chronic daily headaches. What are the causes? This condition may be caused by frequent use of:  Over-the-counter medicines such as aspirin, ibuprofen, and acetaminophen.  Sinus relief medicines and other medicines that contain caffeine.  Narcotic pain medicines such as codeine and oxycodone. What are the signs or symptoms? The symptoms of a rebound headache are the same as the symptoms of the original headache. Some of the symptoms of specific types of headaches include: Migraine headache  Pulsing or throbbing pain on one or both sides of the head.  Severe pain that interferes with daily activities.  Pain that is worsened by physical  activity.  Nausea, vomiting, or both.  Pain with exposure to bright light, loud noises, or strong smells.  General sensitivity to bright light, loud noises, or strong smells.  Visual changes.  Numbness of one or both arms. Tension headache  Pressure around the head.  Dull, aching head pain.  Pain felt over the front and sides of the head.  Tenderness in the muscles of the head, neck, and shoulders. Cluster headache  Severe pain that begins in or around one eye or temple.  Redness and tearing in the eye on the same side as the pain.  Droopy or swollen eyelid.  One-sided head pain.  Nausea.  Runny nose.  Sweaty, pale facial skin.  Restlessness. How is this diagnosed? This condition is diagnosed by:  Reviewing your medical history. This includes the nature of your primary headaches.  Reviewing the types of pain medicines that you have been using to treat your headaches and how often you take them. How is this treated? This condition may be treated or managed by:  Discontinuing frequent use of the analgesic medicine. Doing this may worsen your headaches at first, but the pain should eventually become more manageable, less frequent, and less severe.  Seeing a headache specialist. He or she may be able to help you manage your headaches and help make sure there is not another cause of the headaches.  Using methods of stress relief, such as acupuncture, counseling, biofeedback, and massage. Talk with your health care provider about which methods might be good for you. Follow these instructions at home:  Take over-the-counter and prescription medicines only as told by your health care provider.  Stop the repeated use of pain medicine as told by your health care provider. Stopping can be difficult. Carefully follow instructions from your health care provider.  Avoid triggers that are known to cause your primary headaches.  Keep all follow-up visits as told by your  health care provider. This is important. Contact a health care provider if:  You continue to experience headaches after following treatments that your health care provider recommended. Get help right away if:  You develop new headache pain.  You develop headache pain that is different than what you have experienced in the past.  You develop numbness or tingling in your arms or legs.  You develop changes in your speech or vision. This information is not intended to replace advice given to you by your health care provider. Make sure you discuss any questions you have with your health care provider. Document Released: 10/09/2003 Document Revised: 07/01/2017 Document Reviewed: 12/22/2015 Elsevier Patient Education  2020 [place]. Migraine Headache A migraine headache is an intense, throbbing pain on one side or both sides of the head. Migraine headaches may also cause other symptoms, such as nausea, vomiting, and sensitivity to light and noise. A migraine headache can last from 4 hours to 3 days. Talk with your doctor about what things may bring on (trigger) your migraine headaches. What are the causes? The exact cause of this condition is not known. However, a migraine may be caused when nerves in the brain become irritated and release chemicals that cause inflammation of blood vessels. This inflammation causes pain. This condition may be triggered or caused by:  Drinking alcohol.  Smoking.  Taking medicines, such as: ? Medicine used to treat chest pain (nitroglycerin). ? Birth control pills. ? Estrogen. ? Certain blood pressure medicines.  Eating or drinking products that contain nitrates, glutamate, aspartame, or tyramine. Aged cheeses, chocolate, or caffeine may also be triggers.  Doing physical activity. Other things that may trigger a migraine headache include:  Menstruation.  Pregnancy.  Hunger.  Stress.  Lack of sleep or too much sleep.  Weather  changes.  Fatigue. What increases the risk? The following factors may make you more likely to experience migraine headaches:  Being a certain age. This condition is more common in people who are 40-63 years old.  Being female.  Having a family history of migraine headaches.  Being Caucasian.  Having a mental health condition, such as depression or anxiety.  Being obese. What are the signs or symptoms? The main symptom of this condition is pulsating or throbbing pain. This pain may:  Happen in any area of the head, such as on one side or both sides.  Interfere with daily activities.  Get worse with physical activity.  Get worse with exposure to bright lights or loud noises. Other symptoms may include:  Nausea.  Vomiting.  Dizziness.  General sensitivity to bright lights, loud noises, or smells. Before you get a migraine headache, you may get warning signs (an aura). An aura may include:  Seeing flashing lights or having blind spots.  Seeing bright spots, halos, or zigzag lines.  Having tunnel vision or blurred vision.  Having numbness or a tingling feeling.  Having trouble talking.  Having muscle weakness. Some people have symptoms after a migraine headache (postdromal phase), such as:  Feeling tired.  Difficulty concentrating. How is this diagnosed? A migraine headache can be diagnosed based on:  Your symptoms.  A physical exam.  Tests, such as: ? CT scan or an MRI  of the head. These imaging tests can help rule out other causes of headaches. ? Taking fluid from the spine (lumbar puncture) and analyzing it (cerebrospinal fluid analysis, or CSF analysis). How is this treated? This condition may be treated with medicines that:  Relieve pain.  Relieve nausea.  Prevent migraine headaches. Treatment for this condition may also include:  Acupuncture.  Lifestyle changes like avoiding foods that trigger migraine  headaches.  Biofeedback.  Cognitive behavioral therapy. Follow these instructions at home: Medicines  Take over-the-counter and prescription medicines only as told by your health care provider.  Ask your health care provider if the medicine prescribed to you: ? Requires you to avoid driving or using heavy machinery. ? Can cause constipation. You may need to take these actions to prevent or treat constipation:  Drink enough fluid to keep your urine pale yellow.  Take over-the-counter or prescription medicines.  Eat foods that are high in fiber, such as beans, whole grains, and fresh fruits and vegetables.  Limit foods that are high in fat and processed sugars, such as fried or sweet foods. Lifestyle  Do not drink alcohol.  Do not use any products that contain nicotine or tobacco, such as cigarettes, e-cigarettes, and chewing tobacco. If you need help quitting, ask your health care provider.  Get at least 8 hours of sleep every night.  Find ways to manage stress, such as meditation, deep breathing, or yoga. General instructions      Keep a journal to find out what may trigger your migraine headaches. For example, write down: ? What you eat and drink. ? How much sleep you get. ? Any change to your diet or medicines.  If you have a migraine headache: ? Avoid things that make your symptoms worse, such as bright lights. ? It may help to lie down in a dark, quiet room. ? Do not drive or use heavy machinery. ? Ask your health care provider what activities are safe for you while you are experiencing symptoms.  Keep all follow-up visits as told by your health care provider. This is important. Contact a health care provider if:  You develop symptoms that are different or more severe than your usual migraine headache symptoms.  You have more than 15 headache days in one month. Get help right away if:  Your migraine headache becomes severe.  Your migraine headache lasts  longer than 72 hours.  You have a fever.  You have a stiff neck.  You have vision loss.  Your muscles feel weak or like you cannot control them.  You start to lose your balance often.  You have trouble walking.  You faint.  You have a seizure. Summary  A migraine headache is an intense, throbbing pain on one side or both sides of the head. Migraines may also cause other symptoms, such as nausea, vomiting, and sensitivity to light and noise.  This condition may be treated with medicines and lifestyle changes. You may also need to avoid certain things that trigger a migraine headache.  Keep a journal to find out what may trigger your migraine headaches.  Contact your health care provider if you have more than 15 headache days in a month or you develop symptoms that are different or more severe than your usual migraine headache symptoms. This information is not intended to replace advice given to you by your health care provider. Make sure you discuss any questions you have with your health care provider. Document Released: 07/19/2005 Document Revised:  11/10/2018 Document Reviewed: 08/31/2018 Elsevier Patient Education  [place]. Trigger Point Injection Trigger points are areas where you have pain. A trigger point injection is a shot given in the trigger point to help relieve pain for a few days to a few months. Common places for trigger points include:  The neck.  The shoulders.  The upper back.  The lower back. A trigger point injection will not cure long-term (chronic) pain permanently. These injections do not always work for every person. For some people, they can help to relieve pain for a few days to a few months. Tell a health care provider about:  Any allergies you have.  All medicines you are taking, including vitamins, herbs, eye drops, creams, and over-the-counter medicines.  Any problems you or family members have had with anesthetic medicines.  Any  blood disorders you have.  Any surgeries you have had.  Any medical conditions you have. What are the risks? Generally, this is a safe procedure. However, problems may occur, including:  Infection.  Bleeding or bruising.  Allergic reaction to the injected medicine.  Irritation of the skin around the injection site. What happens before the procedure? Ask your health care provider about:  Changing or stopping your regular medicines. This is especially important if you are taking diabetes medicines or blood thinners.  Taking medicines such as aspirin and ibuprofen. These medicines can thin your blood. Do not take these medicines unless your health care provider tells you to take them.  Taking over-the-counter medicines, vitamins, herbs, and supplements. What happens during the procedure?   Your health care provider will feel for trigger points. A marker may be used to circle the area for the injection.  The skin over the trigger point will be washed with a germ-killing (antiseptic) solution.  A thin needle is used for the injection. You may feel pain or a twitching feeling when the needle enters the trigger point.  A numbing solution may be injected into the trigger point. Sometimes a medicine to keep down inflammation is also injected.  Your health care provider may move the needle around the area where the trigger point is located until the tightness and twitching goes away.  After the injection, your health care provider may put gentle pressure over the injection site.  The injection site will be covered with a bandage (dressing). The procedure may vary among health care providers and hospitals. What can I expect after treatment? After treatment, you may have:  Soreness and stiffness for 1-2 days.  A dressing. This can be taken off in a few hours or as told by your health care provider. Follow these instructions at home: Injection site care  Remove your dressing as  told by your health care provider.  Check your injection site every day for signs of infection. Check for: ? Redness, swelling, or pain. ? Fluid or blood. ? Warmth. ? Pus or a bad smell. Managing pain, stiffness, and swelling  If directed, put ice on the affected area. ? Put ice in a plastic bag. ? Place a towel between your skin and the bag. ? Leave the ice on for 20 minutes, 2-3 times a day. General instructions  If you were asked to stop your regular medicines, ask your health care provider when you may start taking them again.  Return to your normal activities as told by your health care provider. Ask your health care provider what activities are safe for you.  Do not take baths, swim,  or use a hot tub until your health care provider approves.  You may be asked to see an occupational or physical therapist for exercises that reduce muscle strain and stretch the area of the trigger point.  Keep all follow-up visits as told by your health care provider. This is important. Contact a health care provider if:  Your pain comes back, and it is worse than before the injection. You may need more injections.  You have chills or a fever.  The injection site becomes more painful, red, swollen, or warm to the touch. Summary  A trigger point injection is a shot given in the trigger point to help relieve pain for a few days to a few months.  Common places for trigger point injections are the neck, shoulder, upper back, and lower back.  These injections do not always work for every person, but for some people, the injections can help to relieve pain for a few days to a few months.  Contact a health care provider if symptoms come back or they are worse than before treatment. Also, get help if the injection site becomes more painful, red, swollen, or warm to the touch. This information is not intended to replace advice given to you by your health care provider. Make sure you discuss any  questions you have with your health care provider. Document Released: 07/08/2011 Document Revised: 08/30/2018 Document Reviewed: 08/30/2018 Elsevier Patient Education  [place]. Preeclampsia and Eclampsia Preeclampsia is a serious condition that may develop during pregnancy. This condition causes high blood pressure and increased protein in your urine along with other symptoms, such as headaches and vision changes. These symptoms may develop as the condition gets worse. Preeclampsia may occur at 20 weeks of pregnancy or later. Diagnosing and treating preeclampsia early is very important. If not treated early, it can cause serious problems for you and your baby. One problem it can lead to is eclampsia. Eclampsia is a condition that causes muscle jerking or shaking (convulsions or seizures) and other serious problems for the mother. During pregnancy, delivering your baby may be the best treatment for preeclampsia or eclampsia. For most women, preeclampsia and eclampsia symptoms go away after giving birth. In rare cases, a woman may develop preeclampsia after giving birth (postpartum preeclampsia). This usually occurs within 48 hours after childbirth but may occur up to 6 weeks after giving birth. What are the causes? The cause of preeclampsia is not known. What increases the risk? The following risk factors make you more likely to develop preeclampsia:  Being pregnant for the first time.  Having had preeclampsia during a past pregnancy.  Having a family history of preeclampsia.  Having high blood pressure.  Being pregnant with more than one baby.  Being 61 or older.  Being African-American.  Having kidney disease or diabetes.  Having medical conditions such as lupus or blood diseases.  Being very overweight (obese). What are the signs or symptoms? The most common symptoms are:  Severe headaches.  Vision problems, such as blurred or double vision.  Abdominal pain,  especially upper abdominal pain. Other symptoms that may develop as the condition gets worse include:  Sudden weight gain.  Sudden swelling of the hands, face, legs, and feet.  Severe nausea and vomiting.  Numbness in the face, arms, legs, and feet.  Dizziness.  Urinating less than usual.  Slurred speech.  Convulsions or seizures. How is this diagnosed? There are no screening tests for preeclampsia. Your health care provider will ask  you about symptoms and check for signs of preeclampsia during your prenatal visits. You may also have tests that include:  Checking your blood pressure.  Urine tests to check for protein. Your health care provider will check for this at every prenatal visit.  Blood tests.  Monitoring your baby's heart rate.  Ultrasound. How is this treated? You and your health care provider will determine the treatment approach that is best for you. Treatment may include:  Having more frequent prenatal exams to check for signs of preeclampsia, if you have an increased risk for preeclampsia.  Medicine to lower your blood pressure.  Staying in the hospital, if your condition is severe. There, treatment will focus on controlling your blood pressure and the amount of fluids in your body (fluid retention).  Taking medicine (magnesium sulfate) to prevent seizures. This may be given as an injection or through an IV.  Taking a low-dose aspirin during your pregnancy.  Delivering your baby early. You may have your labor started with medicine (induced), or you may have a cesarean delivery. Follow these instructions at home: Eating and drinking   Drink enough fluid to keep your urine pale yellow.  Avoid caffeine. Lifestyle  Do not use any products that contain nicotine or tobacco, such as cigarettes and e-cigarettes. If you need help quitting, ask your health care provider.  Do not use alcohol or drugs.  Avoid stress as much as possible. Rest and get plenty  of sleep. General instructions  Take over-the-counter and prescription medicines only as told by your health care provider.  When lying down, lie on your left side. This keeps pressure off your major blood vessels.  When sitting or lying down, raise (elevate) your feet. Try putting some pillows underneath your lower legs.  Exercise regularly. Ask your health care provider what kinds of exercise are best for you.  Keep all follow-up and prenatal visits as told by your health care provider. This is important. How is this prevented? There is no known way of preventing preeclampsia or eclampsia from developing. However, to lower your risk of complications and detect problems early:  Get regular prenatal care. Your health care provider may be able to diagnose and treat the condition early.  Maintain a healthy weight. Ask your health care provider for help managing weight gain during pregnancy.  Work with your health care provider to manage any long-term (chronic) health conditions you have, such as diabetes or kidney problems.  You may have tests of your blood pressure and kidney function after giving birth.  Your health care provider may have you take low-dose aspirin during your next pregnancy. Contact a health care provider if:  You have symptoms that your health care provider told you may require more treatment or monitoring, such as: ? Headaches. ? Nausea or vomiting. ? Abdominal pain. ? Dizziness. ? Light-headedness. Get help right away if:  You have severe: ? Abdominal pain. ? Headaches that do not get better. ? Dizziness. ? Vision problems. ? Confusion. ? Nausea or vomiting.  You have any of the following: ? A seizure. ? Sudden, rapid weight gain. ? Sudden swelling in your hands, ankles, or face. ? Trouble moving any part of your body. ? Numbness in any part of your body. ? Trouble speaking. ? Abnormal bleeding.  You faint. Summary  Preeclampsia is a serious  condition that may develop during pregnancy.  This condition causes high blood pressure and increased protein in your urine along with other symptoms, such as  headaches and vision changes.  Diagnosing and treating preeclampsia early is very important. If not treated early, it can cause serious problems for you and your baby.  Get help right away if you have symptoms that your health care provider told you to watch for. This information is not intended to replace advice given to you by your health care provider. Make sure you discuss any questions you have with your health care provider. Document Released: 07/16/2000 Document Revised: 03/21/2018 Document Reviewed: 02/23/2016 Elsevier Patient Education  [place]. Hypertension During Pregnancy High blood pressure (hypertension) is when the force of blood pumping through the arteries is too strong. Arteries are blood vessels that carry blood from the heart throughout the body. Hypertension during pregnancy can be mild or severe. Severe hypertension during pregnancy (preeclampsia) is a medical emergency that requires prompt evaluation and treatment. Different types of hypertension can happen during pregnancy. These include:  Chronic hypertension. This happens when you had high blood pressure before you became pregnant, and it continues during the pregnancy. Hypertension that develops before you are [redacted] weeks pregnant and continues during the pregnancy is also called chronic hypertension. If you have chronic hypertension, it will not go away after you have your baby. You will need follow-up visits with your health care provider after you have your baby. Your doctor may want you to keep taking medicine for your blood pressure.  Gestational hypertension. This is hypertension that develops after the 20th week of pregnancy. Gestational hypertension usually goes away after you have your baby, but your health care provider will need to monitor your  blood pressure to make sure that it is getting better.  Preeclampsia. This is severe hypertension during pregnancy. This can cause serious complications for you and your baby and can also cause complications for you after the delivery of your baby.  Postpartum preeclampsia. You may develop severe hypertension after giving birth. This usually occurs within 48 hours after childbirth but may occur up to 6 weeks after giving birth. This is rare. How does this affect me? Women who have hypertension during pregnancy have a greater chance of developing hypertension later in life or during future pregnancies. In some cases, hypertension during pregnancy can cause serious complications, such as:  Stroke.  Heart attack.  Injury to other organs, such as kidneys, lungs, or liver.  Preeclampsia.  Convulsions or seizures.  Placental abruption. How does this affect my baby? Hypertension during pregnancy can affect your baby. Your baby may:  Be born early (prematurely).  Not weigh as much as he or she should at birth (low birth weight).  Not tolerate labor well, leading to an unplanned cesarean delivery. What are the risks? There are certain factors that make it more likely for you to develop hypertension during pregnancy. These include:  Having hypertension during a previous pregnancy.  Being overweight.  Being age 41 or older.  Being pregnant for the first time.  Being pregnant with more than one baby.  Becoming pregnant using fertilization methods, such as IVF (in vitro fertilization).  Having other medical problems, such as diabetes, kidney disease, or lupus.  Having a family history of hypertension. What can I do to lower my risk? The exact cause of hypertension during pregnancy is not known. You may be able to lower your risk by:  Maintaining a healthy weight.  Eating a healthy and balanced diet.  Following your health care provider's instructions about treating any  long-term conditions that you had before becoming pregnant. It  is very important to keep all of your prenatal care appointments. Your health care provider will check your blood pressure and make sure that your pregnancy is progressing as expected. If a problem is found, early treatment can prevent complications. How is this treated? Treatment for hypertension during pregnancy varies depending on the type of hypertension you have and how serious it is.  If you were taking medicine for high blood pressure before you became pregnant, talk with your health care provider. You may need to change medicine during pregnancy because some medicines, like ACE inhibitors, may not be considered safe for your baby.  If you have gestational hypertension, your health care provider may order medicine to treat this during pregnancy.  If you are at risk for preeclampsia, your health care provider may recommend that you take a low-dose aspirin during your pregnancy.  If you have severe hypertension, you may need to be hospitalized so you and your baby can be monitored closely. You may also need to be given medicine to lower your blood pressure. This medicine may be given by mouth or through an IV.  In some cases, if your condition gets worse, you may need to deliver your baby early. Follow these instructions at home: Eating and drinking   Drink enough fluid to keep your urine pale yellow.  Avoid caffeine. Lifestyle  Do not use any products that contain nicotine or tobacco, such as cigarettes, e-cigarettes, and chewing tobacco. If you need help quitting, ask your health care provider.  Do not use alcohol or drugs.  Avoid stress as much as possible.  Rest and get plenty of sleep.  Regular exercise can help to reduce your blood pressure. Ask your health care provider what kinds of exercise are best for you. General instructions  Take over-the-counter and prescription medicines only as told by your health  care provider.  Keep all prenatal and follow-up visits as told by your health care provider. This is important. Contact a health care provider if:  You have symptoms that your health care provider told you may require more treatment or monitoring, such as: ? Headaches. ? Nausea or vomiting. ? Abdominal pain. ? Dizziness. ? Light-headedness. Get help right away if:  You have: ? Severe abdominal pain that does not get better with treatment. ? A severe headache that does not get better. ? Vomiting that does not get better. ? Sudden, rapid weight gain. ? Sudden swelling in your hands, ankles, or face. ? Vaginal bleeding. ? Blood in your urine. ? Blurred or double vision. ? Shortness of breath or chest pain. ? Weakness on one side of your body. ? Difficulty speaking.  Your baby is not moving as much as usual. Summary  High blood pressure (hypertension) is when the force of blood pumping through the arteries is too strong.  Hypertension during pregnancy can cause problems for you and your baby.  Treatment for hypertension during pregnancy varies depending on the type of hypertension you have and how serious it is.  Keep all prenatal and follow-up visits as told by your health care provider. This is important. This information is not intended to replace advice given to you by your health care provider. Make sure you discuss any questions you have with your health care provider. Document Released: 04/06/2011 Document Revised: 11/09/2018 Document Reviewed: 08/15/2018 Elsevier Patient Education  2020 [employer].

## 2019-07-23 NOTE — MAU Note (Signed)
Has a headache, that she can't get rid of for the last 3 days. Took Tylenol 1000mg  at 0600 and again at 1600, no relief Making her very nauseated, has started vomiting.   Seeing spots.  Feels like her head is going to split.  169/100 at 1600.  Increased swelling in hands and from knees down, denies epigastric pain.

## 2019-08-03 NOTE — L&D Delivery Note (Signed)
Delivery Note At 5:50 PM a viable female was delivered via Vaginal, Spontaneous (Presentation:   Occiput Anterior).  APGAR: , ; weight 5 lb 1.8 oz (2320 g).   Placenta status: Spontaneous, Intact.  Cord:   with the following complications: None.  Cord pH: n/a  Anesthesia: Epidural Episiotomy:  none Lacerations:  Right  Suture Repair: n/a Est. Blood Loss (mL):  206cc  Mom to postpartum.  Baby to Nursery.  Annalee Genta 09/01/2019, 6:10 PM

## 2019-08-27 ENCOUNTER — Inpatient Hospital Stay (HOSPITAL_COMMUNITY): Payer: 59

## 2019-08-27 ENCOUNTER — Inpatient Hospital Stay (HOSPITAL_COMMUNITY)
Admission: AD | Admit: 2019-08-27 | Discharge: 2019-09-03 | DRG: 807 | Disposition: A | Discharge status: Home / Self Care | Payer: 59 | Attending: Obstetrics & Gynecology | Admitting: Obstetrics & Gynecology

## 2019-08-27 ENCOUNTER — Other Ambulatory Visit: Payer: Self-pay

## 2019-08-27 ENCOUNTER — Encounter (HOSPITAL_COMMUNITY): Payer: Self-pay | Admitting: Obstetrics and Gynecology

## 2019-08-27 DIAGNOSIS — R1011 Right upper quadrant pain: Secondary | ICD-10-CM | POA: Diagnosis not present

## 2019-08-27 DIAGNOSIS — O321XX0 Maternal care for breech presentation, not applicable or unspecified: Secondary | ICD-10-CM | POA: Diagnosis present

## 2019-08-27 DIAGNOSIS — O99344 Other mental disorders complicating childbirth: Secondary | ICD-10-CM | POA: Diagnosis present

## 2019-08-27 DIAGNOSIS — F419 Anxiety disorder, unspecified: Secondary | ICD-10-CM | POA: Diagnosis present

## 2019-08-27 DIAGNOSIS — O1413 Severe pre-eclampsia, third trimester: Secondary | ICD-10-CM | POA: Diagnosis not present

## 2019-08-27 DIAGNOSIS — O99213 Obesity complicating pregnancy, third trimester: Secondary | ICD-10-CM

## 2019-08-27 DIAGNOSIS — O133 Gestational [pregnancy-induced] hypertension without significant proteinuria, third trimester: Secondary | ICD-10-CM | POA: Diagnosis not present

## 2019-08-27 DIAGNOSIS — Z362 Encounter for other antenatal screening follow-up: Secondary | ICD-10-CM

## 2019-08-27 DIAGNOSIS — R519 Headache, unspecified: Secondary | ICD-10-CM | POA: Diagnosis present

## 2019-08-27 DIAGNOSIS — Z3A32 32 weeks gestation of pregnancy: Secondary | ICD-10-CM | POA: Diagnosis not present

## 2019-08-27 DIAGNOSIS — Z20822 Contact with and (suspected) exposure to covid-19: Secondary | ICD-10-CM | POA: Diagnosis present

## 2019-08-27 DIAGNOSIS — O1493 Unspecified pre-eclampsia, third trimester: Secondary | ICD-10-CM

## 2019-08-27 DIAGNOSIS — O1414 Severe pre-eclampsia complicating childbirth: Secondary | ICD-10-CM | POA: Diagnosis present

## 2019-08-27 DIAGNOSIS — Z3A31 31 weeks gestation of pregnancy: Secondary | ICD-10-CM | POA: Diagnosis not present

## 2019-08-27 DIAGNOSIS — Z87891 Personal history of nicotine dependence: Secondary | ICD-10-CM

## 2019-08-27 LAB — CBC
HCT: 34.8 % — ABNORMAL LOW (ref 36.0–46.0)
Hemoglobin: 11.4 g/dL — ABNORMAL LOW (ref 12.0–15.0)
MCH: 27.7 pg (ref 26.0–34.0)
MCHC: 32.8 g/dL (ref 30.0–36.0)
MCV: 84.5 fL (ref 80.0–100.0)
Platelets: 322 10*3/uL (ref 150–400)
RBC: 4.12 MIL/uL (ref 3.87–5.11)
RDW: 13.2 % (ref 11.5–15.5)
WBC: 16.3 10*3/uL — ABNORMAL HIGH (ref 4.0–10.5)
nRBC: 0 % (ref 0.0–0.2)

## 2019-08-27 LAB — PROTEIN / CREATININE RATIO, URINE
Creatinine, Urine: 131.75 mg/dL
Protein Creatinine Ratio: 0.1 mg/mg{Cre} (ref 0.00–0.15)
Total Protein, Urine: 13 mg/dL

## 2019-08-27 LAB — URINALYSIS, ROUTINE W REFLEX MICROSCOPIC
Bilirubin Urine: NEGATIVE
Glucose, UA: NEGATIVE mg/dL
Hgb urine dipstick: NEGATIVE
Ketones, ur: NEGATIVE mg/dL
Leukocytes,Ua: NEGATIVE
Nitrite: NEGATIVE
Protein, ur: NEGATIVE mg/dL
Specific Gravity, Urine: 1.018 (ref 1.005–1.030)
pH: 6 (ref 5.0–8.0)

## 2019-08-27 LAB — COMPREHENSIVE METABOLIC PANEL
ALT: 14 U/L (ref 0–44)
AST: 13 U/L — ABNORMAL LOW (ref 15–41)
Albumin: 2.9 g/dL — ABNORMAL LOW (ref 3.5–5.0)
Alkaline Phosphatase: 120 U/L (ref 38–126)
Anion gap: 9 (ref 5–15)
BUN: <5 mg/dL — ABNORMAL LOW (ref 6–20)
CO2: 20 mmol/L — ABNORMAL LOW (ref 22–32)
Calcium: 9.3 mg/dL (ref 8.9–10.3)
Chloride: 107 mmol/L (ref 98–111)
Creatinine, Ser: 0.55 mg/dL (ref 0.44–1.00)
GFR calc Af Amer: >60 mL/min (ref >60)
GFR calc non Af Amer: >60 mL/min (ref >60)
Glucose, Bld: 102 mg/dL — ABNORMAL HIGH (ref 70–99)
Potassium: 3.6 mmol/L (ref 3.5–5.1)
Sodium: 136 mmol/L (ref 135–145)
Total Bilirubin: 0.3 mg/dL (ref 0.3–1.2)
Total Protein: 6.7 g/dL (ref 6.5–8.1)

## 2019-08-27 LAB — TYPE AND SCREEN
ABO/RH(D): A POS
Antibody Screen: NEGATIVE

## 2019-08-27 LAB — OB RESULTS CONSOLE GBS: GBS: POSITIVE

## 2019-08-27 LAB — ABO/RH: ABO/RH(D): A POS

## 2019-08-27 LAB — SARS CORONAVIRUS 2 (TAT 6-24 HRS): SARS Coronavirus 2: NEGATIVE

## 2019-08-27 LAB — LIPASE, BLOOD: Lipase: 22 U/L (ref 11–51)

## 2019-08-27 MED ORDER — OXYCODONE HCL 5 MG PO TABS
5.0000 mg | ORAL_TABLET | ORAL | Status: DC | PRN
  Administered 2019-08-27 (×2): 5 mg via ORAL
  Filled 2019-08-27 (×2): qty 1

## 2019-08-27 MED ORDER — DOCUSATE SODIUM 100 MG PO CAPS
100.0000 mg | ORAL_CAPSULE | Freq: Every day | ORAL | Status: DC
  Administered 2019-08-27 – 2019-08-30 (×4): 100 mg via ORAL
  Filled 2019-08-27 (×4): qty 1

## 2019-08-27 MED ORDER — MAGNESIUM SULFATE BOLUS VIA INFUSION
4.0000 g | Freq: Once | INTRAVENOUS | Status: AC
  Administered 2019-08-27: 17:00:00 4 g via INTRAVENOUS
  Filled 2019-08-27: qty 1000

## 2019-08-27 MED ORDER — CALCIUM CARBONATE ANTACID 500 MG PO CHEW: 2.0000 | CHEWABLE_TABLET | ORAL | Status: DC | PRN

## 2019-08-27 MED ORDER — LABETALOL HCL 200 MG PO TABS
400.0000 mg | ORAL_TABLET | Freq: Two times a day (BID) | ORAL | Status: DC
  Administered 2019-08-27 – 2019-08-31 (×8): 400 mg via ORAL
  Filled 2019-08-27 (×8): qty 2

## 2019-08-27 MED ORDER — LABETALOL HCL 5 MG/ML IV SOLN: 40.0000 mg | INTRAVENOUS | Status: DC | PRN

## 2019-08-27 MED ORDER — HYDRALAZINE HCL 20 MG/ML IJ SOLN: 10.0000 mg | INTRAMUSCULAR | Status: DC | PRN

## 2019-08-27 MED ORDER — MAGNESIUM SULFATE 40 GM/1000ML IV SOLN: 2.0000 g/h | INTRAVENOUS | Status: DC

## 2019-08-27 MED ORDER — BETAMETHASONE SOD PHOS & ACET 6 (3-3) MG/ML IJ SUSP
12.0000 mg | INTRAMUSCULAR | Status: AC
  Administered 2019-08-27 – 2019-08-28 (×2): 12 mg via INTRAMUSCULAR
  Filled 2019-08-27: qty 5

## 2019-08-27 MED ORDER — MAGNESIUM SULFATE 40 GM/1000ML IV SOLN
2.0000 g/h | INTRAVENOUS | Status: AC
  Administered 2019-08-27 – 2019-08-28 (×2): 2 g/h via INTRAVENOUS
  Filled 2019-08-27: qty 1000

## 2019-08-27 MED ORDER — DEXAMETHASONE SODIUM PHOSPHATE 10 MG/ML IJ SOLN
10.0000 mg | Freq: Once | INTRAMUSCULAR | Status: AC
  Administered 2019-08-27: 15:00:00 10 mg via INTRAVENOUS
  Filled 2019-08-27: qty 1

## 2019-08-27 MED ORDER — OXYCODONE HCL 5 MG PO TABS
5.0000 mg | ORAL_TABLET | ORAL | Status: DC | PRN
  Administered 2019-08-28 – 2019-08-29 (×3): 10 mg via ORAL
  Administered 2019-08-29: 10:00:00 5 mg via ORAL
  Administered 2019-08-29: 05:00:00 10 mg via ORAL
  Administered 2019-08-30: 17:00:00 5 mg via ORAL
  Administered 2019-08-31: 04:00:00 10 mg via ORAL
  Filled 2019-08-27: qty 2
  Filled 2019-08-27: qty 1
  Filled 2019-08-27 (×4): qty 2
  Filled 2019-08-27: qty 1

## 2019-08-27 MED ORDER — LABETALOL HCL 5 MG/ML IV SOLN: 80.0000 mg | INTRAVENOUS | Status: DC | PRN

## 2019-08-27 MED ORDER — ZOLPIDEM TARTRATE 5 MG PO TABS
5.0000 mg | ORAL_TABLET | Freq: Every evening | ORAL | Status: DC | PRN
  Filled 2019-08-27: qty 1

## 2019-08-27 MED ORDER — LACTATED RINGERS IV SOLN: INTRAVENOUS | Status: DC

## 2019-08-27 MED ORDER — METOCLOPRAMIDE HCL 5 MG/ML IJ SOLN
10.0000 mg | Freq: Once | INTRAMUSCULAR | Status: AC
  Administered 2019-08-27: 15:00:00 10 mg via INTRAVENOUS
  Filled 2019-08-27: qty 2

## 2019-08-27 MED ORDER — DIPHENHYDRAMINE HCL 50 MG/ML IJ SOLN
25.0000 mg | Freq: Once | INTRAMUSCULAR | Status: AC
  Administered 2019-08-27: 15:00:00 25 mg via INTRAVENOUS
  Filled 2019-08-27: qty 1

## 2019-08-27 MED ORDER — MAGNESIUM SULFATE 40 GM/1000ML IV SOLN
INTRAVENOUS | Status: AC
  Filled 2019-08-27: qty 1000

## 2019-08-27 MED ORDER — PRENATAL MULTIVITAMIN CH
1.0000 | ORAL_TABLET | Freq: Every day | ORAL | Status: DC
  Administered 2019-08-28 – 2019-08-30 (×3): 1 via ORAL
  Filled 2019-08-27 (×3): qty 1

## 2019-08-27 MED ORDER — LABETALOL HCL 5 MG/ML IV SOLN: 20.0000 mg | INTRAVENOUS | Status: DC | PRN

## 2019-08-27 MED ORDER — ACETAMINOPHEN 325 MG PO TABS
650.0000 mg | ORAL_TABLET | ORAL | Status: DC | PRN
  Administered 2019-08-28 – 2019-08-31 (×5): 650 mg via ORAL
  Filled 2019-08-27 (×5): qty 2

## 2019-08-27 MED ORDER — LABETALOL HCL 5 MG/ML IV SOLN
20.0000 mg | INTRAVENOUS | Status: DC | PRN
  Administered 2019-08-27: 22:00:00 20 mg via INTRAVENOUS
  Filled 2019-08-27: qty 4

## 2019-08-27 NOTE — MAU Provider Note (Signed)
Chief Complaint  Patient presents with  . Abdominal Pain  . Headache  . Foot Swelling  . Leg Swelling     First Provider Initiated Contact with Patient 08/27/19 1454      S: Chelsea Day  is a 24 y.o. y.o. year old G82P1011 female at [redacted]w[redacted]d weeks gestation who presents to MAU with elevated blood pressures. History of preeclampsia in last pregnancy. Had an elevated BP in the office last week & was started on labetalol 400 mg BID by Dr. Nelda Marseille. Has history of headache. Current headache started Saturday. Has been taking tylenol, reglan, & flexeril around the clock without relief. Also reports RUQ abdominal pain with associated nausea that started last week but worsened over the weekend.  Denies fever/chills, visual disturbance, contractions, vomiting/diarrhea, chest pain, SOB.  Currently rates headache & RUQ pain 8/10. Not relieved with any medications. Nothing makes her symptoms better or worse.   Associated symptoms: endorses Headache, denies vision changes, endorses epigastric pain Contractions: denies Vaginal bleeding: denies Fetal movement: good  O:  Patient Vitals for the past 24 hrs:  BP Temp Temp src Pulse Resp SpO2 Height Weight  08/27/19 1700 140/83 -- -- (!) 133 -- 99 % -- --  08/27/19 1630 (!) 147/72 -- -- (!) 135 -- 98 % -- --  08/27/19 1615 (!) 141/74 -- -- (!) 141 -- 99 % -- --  08/27/19 1600 134/69 -- -- (!) 114 -- 98 % -- --  08/27/19 1545 139/77 -- -- (!) 151 -- -- -- --  08/27/19 1530 (!) 144/69 -- -- (!) 122 -- -- -- --  08/27/19 1515 (!) 145/98 -- -- (!) 128 (!) 21 -- -- --  08/27/19 1508 (!) 160/86 -- -- (!) 130 -- -- -- --  08/27/19 1443 (!) 155/81 -- -- (!) 146 -- -- -- --  08/27/19 1423 (!) 160/100 98.7 F (37.1 C) Oral (!) 142 20 100 % 5\' 10"  (1.778 m) 127.6 kg   General: NAD Heart: tachycardia. Regular rhythm Lungs: Normal rate and effort Abd: Soft, NT, Gravid, S=D Extremities: non pitting bilateral Pedal edema. Edema of bilateral hands Neuro: 2+ deep  tendon reflexes, No clonus  NST:  Baseline: 150 bpm, Variability: Good {> 6 bpm), Accelerations: Reactive and Decelerations: Absent  Results for orders placed or performed during the hospital encounter of 08/27/19 (from the past 24 hour(s))  Urinalysis, Routine w reflex microscopic     Status: Abnormal   Collection Time: 08/27/19  2:27 PM  Result Value Ref Range   Color, Urine YELLOW YELLOW   APPearance HAZY (A) CLEAR   Specific Gravity, Urine 1.018 1.005 - 1.030   pH 6.0 5.0 - 8.0   Glucose, UA NEGATIVE NEGATIVE mg/dL   Hgb urine dipstick NEGATIVE NEGATIVE   Bilirubin Urine NEGATIVE NEGATIVE   Ketones, ur NEGATIVE NEGATIVE mg/dL   Protein, ur NEGATIVE NEGATIVE mg/dL   Nitrite NEGATIVE NEGATIVE   Leukocytes,Ua NEGATIVE NEGATIVE  Protein / creatinine ratio, urine     Status: None   Collection Time: 08/27/19  2:40 PM  Result Value Ref Range   Creatinine, Urine 131.75 mg/dL   Total Protein, Urine 13 mg/dL   Protein Creatinine Ratio 0.10 0.00 - 0.15 mg/mg[Cre]  CBC     Status: Abnormal   Collection Time: 08/27/19  3:03 PM  Result Value Ref Range   WBC 16.3 (H) 4.0 - 10.5 K/uL   RBC 4.12 3.87 - 5.11 MIL/uL   Hemoglobin 11.4 (L) 12.0 - 15.0 g/dL  HCT 34.8 (L) 36.0 - 46.0 %   MCV 84.5 80.0 - 100.0 fL   MCH 27.7 26.0 - 34.0 pg   MCHC 32.8 30.0 - 36.0 g/dL   RDW 13.2 11.5 - 15.5 %   Platelets 322 150 - 400 K/uL   nRBC 0.0 0.0 - 0.2 %  Comprehensive metabolic panel     Status: Abnormal   Collection Time: 08/27/19  3:03 PM  Result Value Ref Range   Sodium 136 135 - 145 mmol/L   Potassium 3.6 3.5 - 5.1 mmol/L   Chloride 107 98 - 111 mmol/L   CO2 20 (L) 22 - 32 mmol/L   Glucose, Bld 102 (H) 70 - 99 mg/dL   BUN <5 (L) 6 - 20 mg/dL   Creatinine, Ser 0.55 0.44 - 1.00 mg/dL   Calcium 9.3 8.9 - 10.3 mg/dL   Total Protein 6.7 6.5 - 8.1 g/dL   Albumin 2.9 (L) 3.5 - 5.0 g/dL   AST 13 (L) 15 - 41 U/L   ALT 14 0 - 44 U/L   Alkaline Phosphatase 120 38 - 126 U/L   Total Bilirubin 0.3  0.3 - 1.2 mg/dL   GFR calc non Af Amer >60 >60 mL/min   GFR calc Af Amer >60 >60 mL/min   Anion gap 9 5 - 15  Lipase, blood     Status: None   Collection Time: 08/27/19  3:03 PM  Result Value Ref Range   Lipase 22 11 - 51 U/L    MDM Initial BPs severe range. By the time an IV was started, no longer severe range so IV antihypertensive withheld.  Headache IV cocktail given (decadron, reglan, & benadryl). Initially brought headache down to 4/10 but soon after it started to get worse. Preeclampsia labs are normal.  Reviewed with Dr. Roselie Awkward. Will treat as preeclampsia with severe features due to hypertension & headache resistant to medication - recommends admission.  Patient is tachycardic. Per review of previous MAU visits, has tachycardia at onset of visits dependent on pain. Likely pain response. She is afebrile. No chest pain or SOB. Other than tachycardia, heart sounds are normal.   Liver enzymes & lipase normal. RUQ ultrasound ordered.    A:  1. Preeclampsia, severe, third trimester   2. RUQ pain   3. [redacted] weeks gestation of pregnancy      P:  Admit to [facility] unit per consult with Dr. Roselie Awkward Discussed with Dr. Landry Mellow who contacted NICU regarding admission Dr. Landry Mellow on unit to discuss plan with patient [place] sulfate ordered RUQ ultrasound pending  Jorje Guild, NP 08/27/2019 2:54 PM

## 2019-08-27 NOTE — Consult Note (Signed)
Neonatology Consult to Antenatal Patient:  I was asked by Dr. Landry Mellow to see this patient in order to provide antenatal counseling due to prematurity.  Mrs. Shiflet was admitted today at 31.[redacted] weeks GA for suspected pre-eclampsia with severe features.  H/ o PIH.  She is currently not having active labor and membranes are intact. She is getting BMZ, labetalol and IV Magnesium.  Baby is breech at this time.    I spoke with the patient and her husband. We discussed the worst case of delivery in the next 1-2 days, including usual DR management, possible respiratory complications and need for support, IV access, feedings (mother desires breast feeding, which was encouraged as well as donor BM to which she agrees), LOS, Mortality and Morbidity, and long term outcomes. She did have multiple questions at this time which I answered. I would be glad to come back if she has more questions later.  Thank you for asking me to see this patient.  Monia Sabal Katherina Mires, MD Neonatologist  The total length of face-to-face or floor/unit time for this encounter was 25 minutes. Counseling and/or coordination of care was 40 minutes of the above.

## 2019-08-27 NOTE — H&P (Signed)
Chelsea Day is a 24 y.o. female G3P1011 at 31 wks and 5 days presents to MAU complaining of persistent headache for the last week. Worse over the weekend. She has taken tylenol / reglan and flexeril without relief . She was started on labetalol 400 mg bid by Dr. Nelda Marseille last week. She report increased swelling over the weekend in her face, feet and legs. bp on arrival 160/100.. repeat 155/81. Prenatal care provided by Dr. Janyth Pupa.    OB History    Gravida  3   Para  1   Term  1   Preterm      AB  1   Living  1     SAB  1   TAB      Ectopic      Multiple  0   Live Births  1          Past Medical History:  Diagnosis Date  . Bilateral ovarian cysts   . Endometriosis determined by laparoscopy   . History of postpartum uterine bleeding   . Irregular periods/menstrual cycles   . Pregnancy induced hypertension    Past Surgical History:  Procedure Laterality Date  . LAPAROSCOPIC OVARIAN CYSTECTOMY Right 01/24/2015   Procedure: LAPAROSCOPIC LYSIS OF ADHESION, LAPROSCOPIC RIGHT SALPINGOOOPHORECTOMY, BIOPSY OF RIGHT BOARD LIGAMENT; LYSIS BIOPSY OF ADHESIONS, BIOPSY OF LEFT UTERO LIGAMENT;  Surgeon: Ena Dawley, MD;  Location: [facility];  Service: Gynecology;  Laterality: Right;  . LAPAROSCOPY  11/08/2011   Procedure: LAPAROSCOPY OPERATIVE;  Surgeon: Olga Millers, MD;  Location: [facility];  Service: Gynecology;  Laterality: Right;  . OVARIAN CYST REMOVAL  11/08/2011   Procedure: OVARIAN CYSTECTOMY;  Surgeon: Olga Millers, MD;  Location: [facility];  Service: Gynecology;  Laterality: Right;  . UNILATERAL SALPINGECTOMY Right 01/24/15   Family History: family history includes Cancer in her father; Multiple sclerosis in her mother. Social History:  reports that she quit smoking about 5 years ago. Her smoking use included cigarettes. She has never used smokeless tobacco. She reports that she does not drink alcohol or use drugs.    Review of Systems  Constitutional: Negative.    HENT: Positive for facial swelling.   Eyes: Positive for visual disturbance.  Respiratory: Negative.   Cardiovascular: Negative.   Gastrointestinal: Negative.   Endocrine: Negative.   Genitourinary: Negative.   Musculoskeletal: Negative.   Skin: Negative.   Allergic/Immunologic: Negative.   Neurological: Positive for headaches.  Hematological: Negative.   Psychiatric/Behavioral: Negative.    History Dilation: Closed Exam by:: Reanne Nellums Blood pressure (!) 151/84, pulse (!) 127, temperature 98.7 F (37.1 C), temperature source Oral, resp. rate 19, height 5\' 10"  (1.778 m), weight 127.6 kg, last menstrual period 04/30/2018, SpO2 98 %, unknown if currently breastfeeding. Maternal Exam:  Abdomen: Fetal presentation: breech  Introitus: Normal vulva.   Fetal Exam Fetal Monitor Review: Baseline rate: 140.  Variability: moderate (6-25 bpm).   Pattern: accelerations present.    Fetal State Assessment: Category I - tracings are normal.     Physical Exam  Vitals reviewed. Constitutional: She is oriented to person, place, and time. She appears well-developed and well-nourished.  HENT:  Head: Normocephalic and atraumatic.  Eyes: Pupils are equal, round, and reactive to light. Conjunctivae are normal.  Cardiovascular: Normal rate and regular rhythm.  Respiratory: Effort normal and breath sounds normal.  GI: There is no abdominal tenderness.  Genitourinary:    Vulva and vagina normal.   Musculoskeletal:  General: Edema present.     Cervical back: Normal range of motion and neck supple.  Neurological: She is alert and oriented to person, place, and time. She has normal reflexes.  Skin: Skin is warm and dry.  Psychiatric: She has a normal mood and affect.    Cervix: closed thick high   Prenatal labs: ABO, Rh:   Antibody:   Rubella:   RPR:    HBsAg:    HIV:    GBS:  Pending   Results for orders placed or performed during the hospital encounter of 08/27/19 (from the past  24 hour(s))  Urinalysis, Routine w reflex microscopic     Status: Abnormal   Collection Time: 08/27/19  2:27 PM  Result Value Ref Range   Color, Urine YELLOW YELLOW   APPearance HAZY (A) CLEAR   Specific Gravity, Urine 1.018 1.005 - 1.030   pH 6.0 5.0 - 8.0   Glucose, UA NEGATIVE NEGATIVE mg/dL   Hgb urine dipstick NEGATIVE NEGATIVE   Bilirubin Urine NEGATIVE NEGATIVE   Ketones, ur NEGATIVE NEGATIVE mg/dL   Protein, ur NEGATIVE NEGATIVE mg/dL   Nitrite NEGATIVE NEGATIVE   Leukocytes,Ua NEGATIVE NEGATIVE  Protein / creatinine ratio, urine     Status: None   Collection Time: 08/27/19  2:40 PM  Result Value Ref Range   Creatinine, Urine 131.75 mg/dL   Total Protein, Urine 13 mg/dL   Protein Creatinine Ratio 0.10 0.00 - 0.15 mg/mg[Cre]  CBC     Status: Abnormal   Collection Time: 08/27/19  3:03 PM  Result Value Ref Range   WBC 16.3 (H) 4.0 - 10.5 K/uL   RBC 4.12 3.87 - 5.11 MIL/uL   Hemoglobin 11.4 (L) 12.0 - 15.0 g/dL   HCT 34.8 (L) 36.0 - 46.0 %   MCV 84.5 80.0 - 100.0 fL   MCH 27.7 26.0 - 34.0 pg   MCHC 32.8 30.0 - 36.0 g/dL   RDW 13.2 11.5 - 15.5 %   Platelets 322 150 - 400 K/uL   nRBC 0.0 0.0 - 0.2 %  Comprehensive metabolic panel     Status: Abnormal   Collection Time: 08/27/19  3:03 PM  Result Value Ref Range   Sodium 136 135 - 145 mmol/L   Potassium 3.6 3.5 - 5.1 mmol/L   Chloride 107 98 - 111 mmol/L   CO2 20 (L) 22 - 32 mmol/L   Glucose, Bld 102 (H) 70 - 99 mg/dL   BUN <5 (L) 6 - 20 mg/dL   Creatinine, Ser 0.55 0.44 - 1.00 mg/dL   Calcium 9.3 8.9 - 10.3 mg/dL   Total Protein 6.7 6.5 - 8.1 g/dL   Albumin 2.9 (L) 3.5 - 5.0 g/dL   AST 13 (L) 15 - 41 U/L   ALT 14 0 - 44 U/L   Alkaline Phosphatase 120 38 - 126 U/L   Total Bilirubin 0.3 0.3 - 1.2 mg/dL   GFR calc non Af Amer >60 >60 mL/min   GFR calc Af Amer >60 >60 mL/min   Anion gap 9 5 - 15  Lipase, blood     Status: None   Collection Time: 08/27/19  3:03 PM  Result Value Ref Range   Lipase 22 11 - 51 U/L     Assessment/Plan: 31 wks and 5 days with suspected  preeclampsia with severe features based on persistent headache - Admit to Antenatal ( Approval by DR. Ehrman with NICU) pt advised of possibility of seperation from newborn if delivery is warranted.  -  Magnesium Sulfate for seizure prophylaxis  - Betamethasone for fetal lung maturity  - labetalol po 400 mg bid scheduled -- IV labetalol / hydralazine for severe rang pressures.  -24 hour urine for protein  -U/S for EFW -Maternal fetal medicine consult -NICU consult Breech presenation by bedside ultrasound- If delivery is recommended cesarean section will be required if fetus remains breech- pt notified.  NPO after midnight Dr. Nelda Marseille to assume care at 7 am 08/28/2019    Christophe Louis 08/27/2019, 5:55 PM

## 2019-08-27 NOTE — MAU Note (Signed)
"  head is splitting open, pretty much have them constantly but the migraine started Sat, took Tylenol and Flexaril and Reglan, no relief."  Feet and legs are so swollen they are tight, got bad on Sat. Is having pain in RUQ, started 2 wks ago- also got bad over weekend, all the way through and radiating up into shoulder.

## 2019-08-28 DIAGNOSIS — R1011 Right upper quadrant pain: Secondary | ICD-10-CM

## 2019-08-28 DIAGNOSIS — Z3A31 31 weeks gestation of pregnancy: Secondary | ICD-10-CM

## 2019-08-28 LAB — COMPREHENSIVE METABOLIC PANEL
ALT: 12 U/L (ref 0–44)
AST: 13 U/L — ABNORMAL LOW (ref 15–41)
Albumin: 2.7 g/dL — ABNORMAL LOW (ref 3.5–5.0)
Alkaline Phosphatase: 112 U/L (ref 38–126)
Anion gap: 13 (ref 5–15)
BUN: 6 mg/dL (ref 6–20)
CO2: 19 mmol/L — ABNORMAL LOW (ref 22–32)
Calcium: 8 mg/dL — ABNORMAL LOW (ref 8.9–10.3)
Chloride: 104 mmol/L (ref 98–111)
Creatinine, Ser: 0.58 mg/dL (ref 0.44–1.00)
GFR calc Af Amer: >60 mL/min (ref >60)
GFR calc non Af Amer: >60 mL/min (ref >60)
Glucose, Bld: 142 mg/dL — ABNORMAL HIGH (ref 70–99)
Potassium: 3.9 mmol/L (ref 3.5–5.1)
Sodium: 136 mmol/L (ref 135–145)
Total Bilirubin: 0.6 mg/dL (ref 0.3–1.2)
Total Protein: 6.2 g/dL — ABNORMAL LOW (ref 6.5–8.1)

## 2019-08-28 LAB — CBC
HCT: 31.7 % — ABNORMAL LOW (ref 36.0–46.0)
Hemoglobin: 10.4 g/dL — ABNORMAL LOW (ref 12.0–15.0)
MCH: 27.7 pg (ref 26.0–34.0)
MCHC: 32.8 g/dL (ref 30.0–36.0)
MCV: 84.5 fL (ref 80.0–100.0)
Platelets: 318 10*3/uL (ref 150–400)
RBC: 3.75 MIL/uL — ABNORMAL LOW (ref 3.87–5.11)
RDW: 13.2 % (ref 11.5–15.5)
WBC: 24.3 10*3/uL — ABNORMAL HIGH (ref 4.0–10.5)
nRBC: 0 % (ref 0.0–0.2)

## 2019-08-28 LAB — PROTEIN, URINE, 24 HOUR
Collection Interval-UPROT: 24 hours
Protein, 24H Urine: 184 mg/d — ABNORMAL HIGH (ref 50–100)
Protein, Urine: 8 mg/dL
Urine Total Volume-UPROT: 2300 mL

## 2019-08-28 LAB — MAGNESIUM: Magnesium: 3.7 mg/dL — ABNORMAL HIGH (ref 1.7–2.4)

## 2019-08-28 MED ORDER — PROMETHAZINE HCL 25 MG PO TABS
25.0000 mg | ORAL_TABLET | Freq: Four times a day (QID) | ORAL | Status: DC | PRN
  Administered 2019-08-28 – 2019-08-31 (×3): 25 mg via ORAL
  Filled 2019-08-28 (×4): qty 1

## 2019-08-28 MED ORDER — SUMATRIPTAN SUCCINATE 50 MG PO TABS
50.0000 mg | ORAL_TABLET | Freq: Four times a day (QID) | ORAL | Status: AC | PRN
  Administered 2019-08-28 – 2019-08-30 (×5): 50 mg via ORAL
  Filled 2019-08-28 (×10): qty 1

## 2019-08-28 MED ORDER — SUMATRIPTAN SUCCINATE 50 MG PO TABS: 50.0000 mg | ORAL_TABLET | Freq: Four times a day (QID) | ORAL | Status: DC | PRN

## 2019-08-28 NOTE — Progress Notes (Signed)
Antepartum Note- HD#2  S: Pt was sleeping this am, notes intermittent sleep overnight due to headache.  Still has intense headache- rates it a 7/10, previously a 10/10 upon arrival.  No nausea/vomiting.  Slight change in her vision and notes upper abdominal pain this am.  No vaginal bleeding, LOF, or contractions. +FM  O: BP 130/72 (BP Location: Right Arm)   Pulse (!) 116   Temp 98.6 F (37 C) (Oral)   Resp (!) 24   Ht 5\' 10"  (1.778 m)   Wt 126.1 kg   LMP 04/30/2018   SpO2 97%   BMI 39.89 kg/m   BP range: (last 12hrs) 128-149/53-79 UOP: 1300/8hr  Gen: resting comfortably, no acute distress CV: RRR Lungs: CTAB Abd: gravid, no RUQ tenderness to palpation Ext: minimal edema, 1+ clonus, SCDs in place  FHT: 125bpm, moderate variability, +accels, no decels Toco: no contractions SVE: deferred  Prelim [place]: Frank breech/fundal/EFW: 5#1oz (95%), AFI: 19   A/P: 23yo G3P1011 @ [redacted]w[redacted]d admitted for preeclampsia with severe features- HD#2  -Preeclampsia with severe features  -currently on IV Magnesium  -BP within normal limits, s/p IV Labetalol x 1 overnight, currently on Labetalol 400mg  bid  -labs stable, repeat labs pending this am  -24hr urine in process, on 08/24/19- 24hr urine protein: 227  -Headache better than arrival, but still present- continue with medication  -s/p NICU consult  -MFM consult this am -FWB- NICHD- Cat. I  -transition to monitoring q shift or as clinically indicated  -s/p BMZ #1, 2nd dose this evening  -Frank breech presentation- discussed possible ECV or C-section if delivery is indicated -Maternal care  -currently NPO- pending recommendation from MFM regarding timing of delivery  -pain medication as needed for headache  -DVT Prophylaxis: SCDs  -continue PNV daily  Janyth Pupa, DO [phone] (cell) [phone] (office)

## 2019-08-28 NOTE — Plan of Care (Signed)
  Problem: Clinical Measurements: Goal: Cardiovascular complication will be avoided Outcome: Progressing   Problem: Activity: Goal: Risk for activity intolerance will decrease Outcome: Progressing   Problem: Health Behavior/Discharge Planning: Goal: Ability to manage health-related needs will improve Outcome: Completed/Met   Problem: Coping: Goal: Level of anxiety will decrease Outcome: Completed/Met   Problem: Elimination: Goal: Will not experience complications related to urinary retention Outcome: Completed/Met   Problem: Education: Goal: Knowledge of disease or condition will improve Outcome: Completed/Met   Problem: Education: Goal: Knowledge of disease or condition will improve Outcome: Completed/Met Goal: Knowledge of the prescribed therapeutic regimen will improve Outcome: Completed/Met

## 2019-08-28 NOTE — Consult Note (Signed)
Maternal-Fetal Medicine  Name: Chelsea Day MRN: [id number] Requesting Provider: Janyth Pupa, DO  I had the pleasure of seeing [name], G3 P1011 at Rew 6d gestation, who was admitted to the [facility] specialty care with a diagnosis of preeclampsia with severe features.  After routine prenatal visit yesterday where 160/101 55/81 mmHg.  Patient also reported increased swelling of her face and legs.  Her headache, which is present from [redacted] weeks gestation, also worsened.  At [facility], her blood pressure was 160/100 mmHg.  Since admission she had several severe range blood pressures more than 4 hours apart.  She received 1 dose of IV labetalol 20 mg yesterday night at 10:23 PM.  Magnesium sulfate infusion was started and the patient received first dose of betamethasone.  She takes labetalol 400 mg twice daily.  Her prenatal course was complicated by headache that was present from [redacted] weeks gestation.  She describes her headache as severe starts without an aura in the middle of her forehead and travels backward.  Several analgesics were tried with little relief and labetalol was started for headache.  She reports no visual disturbances or deterioration.  She had low risk for fetal aneuploidies and screening and does not have gestational diabetes. She reports having intermittent nausea and had one episode of vomiting on Saturday.  She also has right upper quadrant pain that has not worsened since admission.  Past medical history: No history of hypertension or diabetes or thyroid disorders. Past surgical history: Right salpingo-oophorectomy, ovarian cystectomy. Medications: Tylenol, betamethasone, labetalol, magnesium sulfate. Allergies: Peanuts (anaphylaxis), latex (rashes). Social history: Denies tobacco drug or alcohol use.  She is a Engineering geologist.  She has been married 5 years and her husband is in good health. Family history: No history of venous thromboembolism in the  family.  Obstetric history is significant for a term vaginal delivery in February 2018 of a female infant weighing 6 pounds 11 ounces at birth.  Her pregnancy was complicated by preeclampsia at [redacted] weeks gestation and she underwent induction of labor at 8 weeks.  Her daughter is in good health. Following delivery, she had postpartum hemorrhage but did not require blood transfusion. In November 2019, she had a spontaneous miscarriage at [redacted] weeks gestation. GYN history: No history of abnormal Pap smears or breast disease.  Labs: Hb 10.4, HCT 31.7, WBC 24.3 (steroid effect), platelets 318.  Electrolytes within normal limits, creatinine 0.58, AST 13, ALT 12.  Urine protein creatinine ratio 0.1. SARS-CoV-2 negative. Right upper quadrant ultrasound: No abnormalities were seen.  Ultrasound performed yesterday showed normal amniotic fluid.  The estimated fetal weight was at the 95th percentile.  Fetal anatomy appears normal but limited by advanced gestational age.  I counseled the patient of the following: Preeclampsia with severe features -Discussed the diagnostic criteria.  Patient had severe range blood pressures more than 4 hours apart that is consistent with gestational hypertension /preeclampsia with severe features. -Discussed possible complications including maternal stroke, placental abruption, pulmonary edema, renal failure and coagulation disturbances. -Reassured the patient of normal fetal growth. -Discussed timing of delivery.  In the absence of maternal complications, we recommend delivery at [redacted] weeks gestation.  Patient will be hospitalized till delivery.  Headache in pregnancy -Patient has been having headaches since [redacted] weeks gestation and it is difficult to differentiate from migrainous headaches.  Does not have aura and the headache seems to be constant or intermittent.  She has not responded to analgesics including Tylenol.  Short course of sumatriptan may be  tried.  I explained that  sumatriptan is not associated with increased congenital malformations in the fetus and is unlikely at [redacted] weeks gestation.  Studies have shown that it probably increases the risks of preeclampsia (which the patient has) and postpartum hemorrhage.  Patient agreed to take sumatriptan.  Recommendations -BPP tomorrow and twice weekly till delivery. -Discontinue magnesium sulfate after 48 hours. -Complete betamethasone course. -Continue labetalol and increase dosage as necessary. -Sumatriptan 50 mg every 6 hourly as needed (maximum 200 mg a day) may be tried for 48 hours.  Delivery is indicated (not limited to) for following reasons:  -Persistent and uncontrolled hypertension not responding to antihypertensives  -Abnormal labs including increasing liver enzymes or thrombocytopenia (<100,000).  -Non-reassuring fetal heart trace.  -Oligohydramnios or abnormal antenatal testings.  -Oliguria (hourly output 20 mL to 30 mL).  -Increasing creatinine (?1.1) Severe symptoms of headache or visual spots or persistent right upper quadrant pain.  -Placental abruption.  Thank you for consultation.  If you have any questions or concern please do not hesitate to contact me.  Consultation including face-to-face counseling 45 minutes.

## 2019-08-29 ENCOUNTER — Inpatient Hospital Stay (HOSPITAL_COMMUNITY): Payer: 59

## 2019-08-29 DIAGNOSIS — Z3A32 32 weeks gestation of pregnancy: Secondary | ICD-10-CM

## 2019-08-29 DIAGNOSIS — O1413 Severe pre-eclampsia, third trimester: Secondary | ICD-10-CM

## 2019-08-29 MED ORDER — BUTALBITAL-APAP-CAFFEINE 50-325-40 MG PO TABS
2.0000 | ORAL_TABLET | Freq: Four times a day (QID) | ORAL | Status: AC | PRN
  Administered 2019-08-29 – 2019-08-30 (×3): 2 via ORAL
  Filled 2019-08-29 (×3): qty 2

## 2019-08-29 NOTE — Progress Notes (Signed)
Antepartum Note- HD#3  S: Pt resting comfortably in bed.  Headache still comes and goes, but seems to be better this am. No nausea/vomiting.  Pain in RUQ quadrant is mostly under her ribs/breast.  No vaginal bleeding, LOF, or contractions. +FM.  Reports no acute complaints this am  O: BP 131/85 (BP Location: Right Arm)   Pulse (!) 105   Temp 98.9 F (37.2 C) (Oral)   Resp 18   Ht 5\' 10"  (1.778 m)   Wt 126.1 kg   LMP 04/30/2018   SpO2 100%   BMI 39.89 kg/m   BP range: (last 24hr): 127-147/63-88 UOP: 1600/8hr  Gen: resting comfortably, no acute distress CV: RRR Lungs: CTAB Abd: gravid, no RUQ tenderness to palpation Ext: minimal edema, no clonus, SCDs in place  FHT: 125bpm, moderate variability, +accels, no decels Toco: no contractions SVE: deferred  Prelim [place]: Frank breech/fundal/EFW: 5#1oz (95%), AFI: 19  Labs:  24hr urine: 184  A/P: 23yo G3P1011 @ [redacted]w[redacted]d admitted for preeclampsia with severe features- HD#3  -Preeclampsia with severe features  -s/p IV Magnesium  -BP within normal limits, currently on Labetalol 400mg  bid  -labs stable, will continue Mon/Thurs  -Headache improved, continue with sumatriptan x 48hrs  -s/p NICU consult  -Appreciate MFM consultation and recommendations.  Plan to continue in-house management until 34wks or would proceed with delivery based on indications as written per MFM  -FWB- NICHD- Cat. I  -Cat. I, continue shift monitoring  -BPP twice weekly  -BMZ course completed  -Frank breech presentation- discussed possible ECV or C-section if delivery is indicated -Maternal care  -regular diet  -pain and nausea medication as needed  -DVT Prophylaxis: SCDs  -continue PNV daily  Janyth Pupa, DO [phone] (cell) [phone] (office)

## 2019-08-30 ENCOUNTER — Inpatient Hospital Stay (HOSPITAL_COMMUNITY): Payer: 59

## 2019-08-30 LAB — CBC
HCT: 28.8 % — ABNORMAL LOW (ref 36.0–46.0)
Hemoglobin: 9 g/dL — ABNORMAL LOW (ref 12.0–15.0)
MCH: 27.3 pg (ref 26.0–34.0)
MCHC: 31.3 g/dL (ref 30.0–36.0)
MCV: 87.3 fL (ref 80.0–100.0)
Platelets: 289 10*3/uL (ref 150–400)
RBC: 3.3 MIL/uL — ABNORMAL LOW (ref 3.87–5.11)
RDW: 13.6 % (ref 11.5–15.5)
WBC: 19.1 10*3/uL — ABNORMAL HIGH (ref 4.0–10.5)
nRBC: 0 % (ref 0.0–0.2)

## 2019-08-30 LAB — COMPREHENSIVE METABOLIC PANEL
ALT: 11 U/L (ref 0–44)
AST: 14 U/L — ABNORMAL LOW (ref 15–41)
Albumin: 2.4 g/dL — ABNORMAL LOW (ref 3.5–5.0)
Alkaline Phosphatase: 100 U/L (ref 38–126)
Anion gap: 9 (ref 5–15)
BUN: 8 mg/dL (ref 6–20)
CO2: 20 mmol/L — ABNORMAL LOW (ref 22–32)
Calcium: 8.3 mg/dL — ABNORMAL LOW (ref 8.9–10.3)
Chloride: 109 mmol/L (ref 98–111)
Creatinine, Ser: 0.52 mg/dL (ref 0.44–1.00)
GFR calc Af Amer: >60 mL/min (ref >60)
GFR calc non Af Amer: >60 mL/min (ref >60)
Glucose, Bld: 94 mg/dL (ref 70–99)
Potassium: 3.9 mmol/L (ref 3.5–5.1)
Sodium: 138 mmol/L (ref 135–145)
Total Bilirubin: 0.4 mg/dL (ref 0.3–1.2)
Total Protein: 5.3 g/dL — ABNORMAL LOW (ref 6.5–8.1)

## 2019-08-30 LAB — TYPE AND SCREEN
ABO/RH(D): A POS
Antibody Screen: NEGATIVE

## 2019-08-30 LAB — CULTURE, BETA STREP (GROUP B ONLY)

## 2019-08-30 MED ORDER — METOCLOPRAMIDE HCL 5 MG/ML IJ SOLN: 10.0000 mg | Freq: Once | INTRAMUSCULAR | Status: DC

## 2019-08-30 MED ORDER — FERROUS SULFATE 325 (65 FE) MG PO TABS
325.0000 mg | ORAL_TABLET | Freq: Two times a day (BID) | ORAL | Status: DC
  Administered 2019-08-30 (×2): 325 mg via ORAL
  Filled 2019-08-30 (×2): qty 1

## 2019-08-30 MED ORDER — DEXAMETHASONE SODIUM PHOSPHATE 4 MG/ML IJ SOLN
4.0000 mg | Freq: Once | INTRAMUSCULAR | Status: AC
  Administered 2019-08-30: 4 mg via INTRAVENOUS
  Filled 2019-08-30: qty 1

## 2019-08-30 MED ORDER — LACTATED RINGERS IV BOLUS
500.0000 mL | Freq: Once | INTRAVENOUS | Status: AC
  Administered 2019-08-30: 02:00:00 500 mL via INTRAVENOUS

## 2019-08-30 MED ORDER — DIVALPROEX SODIUM 500 MG PO DR TAB
500.0000 mg | DELAYED_RELEASE_TABLET | Freq: Once | ORAL | Status: AC
  Administered 2019-08-30: 18:00:00 500 mg via ORAL
  Filled 2019-08-30: qty 1

## 2019-08-30 MED ORDER — METOCLOPRAMIDE HCL 5 MG/5ML PO SOLN
10.0000 mg | Freq: Once | ORAL | Status: AC
  Administered 2019-08-30: 03:00:00 10 mg via ORAL
  Filled 2019-08-30: qty 10

## 2019-08-30 MED ORDER — KETOROLAC TROMETHAMINE 60 MG/2ML IM SOLN
60.0000 mg | Freq: Once | INTRAMUSCULAR | Status: AC
  Administered 2019-08-30: 02:00:00 60 mg via INTRAMUSCULAR
  Filled 2019-08-30: qty 2

## 2019-08-30 MED ORDER — PROCHLORPERAZINE EDISYLATE 10 MG/2ML IJ SOLN
10.0000 mg | Freq: Once | INTRAMUSCULAR | Status: AC
  Administered 2019-08-30: 18:00:00 10 mg via INTRAVENOUS
  Filled 2019-08-30: qty 2

## 2019-08-30 MED ORDER — VALPROATE SODIUM 500 MG/5ML IV SOLN: 500.0000 mg | Freq: Once | INTRAVENOUS | Status: DC

## 2019-08-30 MED ORDER — ACETAMINOPHEN 10 MG/ML IV SOLN
1000.0000 mg | Freq: Once | INTRAVENOUS | Status: AC
  Administered 2019-08-30: 03:00:00 1000 mg via INTRAVENOUS
  Filled 2019-08-30: qty 100

## 2019-08-30 NOTE — Progress Notes (Signed)
Antepartum Note- HD#4  S: Pt had headache overnight- refer to Ilda Basset note regarding management.  Doing better currently, but states that last night was really tough.  Headache is still there, but only dull. No vaginal bleeding, LOF, or contractions. +FM.  Some nausea, no vomiting.  O: BP (!) 104/47   Pulse 84   Temp 98.9 F (37.2 C) (Oral)   Resp 17   Ht 5\' 10"  (1.778 m)   Wt 126.1 kg   LMP 04/30/2018   SpO2 100%   BMI 39.89 kg/m   BP range: [id number]  Gen: resting comfortably, no acute distress CV: RRR Lungs: CTAB Abd: gravid, no RUQ tenderness to palpation Ext: minimal edema, no clonus, SCDs in place  FHT: 125bpm, moderate variability, +accels, no decels Toco: no contractions SVE: deferred  [place] 08/27/19: Pilar Plate breech/fundal/EFW: 5#1oz (95%), AFI: 19 BPP: 1/27: 6/8  Labs:  24hr urine: 184 Results for orders placed or performed during the hospital encounter of 08/27/19 (from the past 24 hour(s))  CBC     Status: Abnormal   Collection Time: 08/30/19  5:38 AM  Result Value Ref Range   WBC 19.1 (H) 4.0 - 10.5 K/uL   RBC 3.30 (L) 3.87 - 5.11 MIL/uL   Hemoglobin 9.0 (L) 12.0 - 15.0 g/dL   HCT 28.8 (L) 36.0 - 46.0 %   MCV 87.3 80.0 - 100.0 fL   MCH 27.3 26.0 - 34.0 pg   MCHC 31.3 30.0 - 36.0 g/dL   RDW 13.6 11.5 - 15.5 %   Platelets 289 150 - 400 K/uL   nRBC 0.0 0.0 - 0.2 %  Comprehensive metabolic panel     Status: Abnormal   Collection Time: 08/30/19  5:38 AM  Result Value Ref Range   Sodium 138 135 - 145 mmol/L   Potassium 3.9 3.5 - 5.1 mmol/L   Chloride 109 98 - 111 mmol/L   CO2 20 (L) 22 - 32 mmol/L   Glucose, Bld 94 70 - 99 mg/dL   BUN 8 6 - 20 mg/dL   Creatinine, Ser 0.52 0.44 - 1.00 mg/dL   Calcium 8.3 (L) 8.9 - 10.3 mg/dL   Total Protein 5.3 (L) 6.5 - 8.1 g/dL   Albumin 2.4 (L) 3.5 - 5.0 g/dL   AST 14 (L) 15 - 41 U/L   ALT 11 0 - 44 U/L   Alkaline Phosphatase 100 38 - 126 U/L   Total Bilirubin 0.4 0.3 - 1.2 mg/dL   GFR calc non Af Amer >60  >60 mL/min   GFR calc Af Amer >60 >60 mL/min   Anion gap 9 5 - 15  Type and screen [place]     Status: None   Collection Time: 08/30/19  5:39 AM  Result Value Ref Range   ABO/RH(D) A POS    Antibody Screen NEG    Sample Expiration      09/02/2019,2359 Performed at [facility], [address] [place],  [address]      A/P: [phone] @ [redacted]w[redacted]d admitted for preeclampsia with severe features- HD#4  -Preeclampsia with severe features  -s/p IV Magnesium  -BP within normal limits, currently on Labetalol 400mg  bid  -labs stable, will continue Mon/Thurs  -s/p NICU consult  -Appreciate MFM consultation and recommendations.  Plan to continue in-house management until 34wks or would proceed with delivery based on indications as written per MFM  -Headache  -IV Tylenol, Decadron, Toradol, Reglan x 1 overnight  -Neurology consulted this  am over the phone- recommend MRI for further imaging.  If negative will discuss alternative medication regimens  -s/p sumatriptan x 48hr with minimal improvement   -FWB- NICHD- Cat. I  -Cat. I, continue shift monitoring  -BPP twice weekly  -BMZ course completed  -Frank breech presentation- discussed possible ECV or C-section if delivery is indicated -Maternal care  -regular diet  -pain and nausea medication as needed  -iron twice daily  -DVT Prophylaxis: SCDs  -continue PNV daily  DISPO: Reviewed with patient balance of continuing with pregnancy vs management of headache.  Should headache become intractable will review with MFM plan for delivery    Janyth Pupa, DO [phone] (cell) [phone] (office)

## 2019-08-30 NOTE — Progress Notes (Signed)
S: Called to room by RN for intractable migraine HA since 2000 last night.  Patient in darkened room in fetal position on bed with cold cloth over her eyes. States HA is mostly frontal and behind the eyes, feels like her head is splitting in two. It feels like a typical migraine HA for her, but stronger than ever before.   Patient reports neuro consult 3 mo ago, was given Flexeril and Reglan with minimal relief. Reports CAT scan at that time normal.   Denies visual changes, no epigastric pain.   Received:  20:14 - Imitrex 50 mg   21:13 - Oxycodone 10 mg   Phenergan 25 mg  23:14 - Fioricet 10 mg  O:  Vitals:   08/29/19 1538 08/29/19 2010 08/29/19 2154 08/30/19 0130  BP: (!) 117/58 (!) 159/76 140/79 (!) 145/69  Pulse: 87 97 98 99  Resp: 18 18  17   Temp: 99 F (37.2 C) 98.9 F (37.2 C)  98.9 F (37.2 C)  TempSrc: Oral Oral  Oral  SpO2: 98% 99%  100%  Weight:      Height:       Gen: AAO x 3, appears in discomfort Abd: gravid, NT Ext: trace edema Neuro: neg clonus, DTR's + 2 bilat   Intake/Output Summary (Last 24 hours) at 08/30/2019 0232 Last data filed at 08/30/2019 0207 Gross per 24 hour  Intake 2002 ml  Output 2350 ml  Net -348 ml    FHT 120, mod var, + accels, no decels Toco: occasional mild ctx.   A/P: 24 y.o. G3P1011 at [redacted]w[redacted]d  Severe PEC             -s/p IV Magnesium             -BP labile to mild range, currently on Labetalol 400mg  bid FWB - category 1 tracing  Migraine HA, not responsive to triptans, oxycodone, and fioricet  - difficulty differentiating if PEC related or migraine HA which patient has been known to have, but given no increased swelling, no increase neural irritability with normal clonus and DTR's, more inclined to suspect the later.    - trial of new regimen in attempt to break migraine  - decadron 4 mg IV x 1  - Toradol 60 mg IV x 1  - Tylenol 1 gm IV x 1  - Reglan 10 mg IV x 1  - LR bolus 500 cc  - O2 [place] 2 L  Monitor closely for  worsening PEC sx  POC in consult w/ Dr. Alwyn Pea.   Juliene Pina, MSN, CNM 08/30/2019, 2:42 AM

## 2019-08-30 NOTE — Progress Notes (Signed)
Pt reporting return of her headache- behind her eyes and radiate to the back of her neck.  Discussed with neurology- recommended Depakote and compazine. VSS.  Pt to have MRI.  Next step dependent upon results of MRI and patient's symptoms.  Pt is agreeable to above plan.  Janyth Pupa, DO [phone] (cell) [phone] (office)

## 2019-08-31 ENCOUNTER — Inpatient Hospital Stay (HOSPITAL_COMMUNITY): Payer: 59 | Admitting: Anesthesiology

## 2019-08-31 ENCOUNTER — Encounter (HOSPITAL_COMMUNITY)
Admission: AD | Disposition: A | Discharge status: Home / Self Care | Payer: Self-pay | Source: Home / Self Care | Attending: Obstetrics and Gynecology

## 2019-08-31 ENCOUNTER — Encounter (HOSPITAL_COMMUNITY): Payer: Self-pay | Admitting: Obstetrics and Gynecology

## 2019-08-31 LAB — CBC
HCT: 30 % — ABNORMAL LOW (ref 36.0–46.0)
Hemoglobin: 9.6 g/dL — ABNORMAL LOW (ref 12.0–15.0)
MCH: 27.7 pg (ref 26.0–34.0)
MCHC: 32 g/dL (ref 30.0–36.0)
MCV: 86.5 fL (ref 80.0–100.0)
Platelets: 281 10*3/uL (ref 150–400)
RBC: 3.47 MIL/uL — ABNORMAL LOW (ref 3.87–5.11)
RDW: 13.6 % (ref 11.5–15.5)
WBC: 17.6 10*3/uL — ABNORMAL HIGH (ref 4.0–10.5)
nRBC: 0 % (ref 0.0–0.2)

## 2019-08-31 SURGERY — Surgical Case
Anesthesia: Regional

## 2019-08-31 MED ORDER — ONDANSETRON HCL 4 MG/2ML IJ SOLN
INTRAMUSCULAR | Status: AC
  Filled 2019-08-31: qty 2

## 2019-08-31 MED ORDER — PHENYLEPHRINE HCL-NACL 20-0.9 MG/250ML-% IV SOLN
INTRAVENOUS | Status: AC
  Filled 2019-08-31: qty 250

## 2019-08-31 MED ORDER — SODIUM CHLORIDE 0.9 % IV SOLN
5.0000 10*6.[IU] | Freq: Once | INTRAVENOUS | Status: AC
  Administered 2019-08-31: 22:00:00 5 10*6.[IU] via INTRAVENOUS
  Filled 2019-08-31: qty 5

## 2019-08-31 MED ORDER — MISOPROSTOL 25 MCG QUARTER TABLET
25.0000 ug | ORAL_TABLET | ORAL | Status: DC | PRN
  Administered 2019-08-31 (×3): 25 ug via VAGINAL
  Filled 2019-08-31 (×3): qty 1

## 2019-08-31 MED ORDER — OXYTOCIN 40 UNITS IN NORMAL SALINE INFUSION - SIMPLE MED
INTRAVENOUS | Status: AC
  Filled 2019-08-31: qty 1000

## 2019-08-31 MED ORDER — LIDOCAINE-EPINEPHRINE (PF) 2 %-1:200000 IJ SOLN
INTRAMUSCULAR | Status: DC | PRN
  Administered 2019-08-31: 5 mL via EPIDURAL
  Administered 2019-08-31: 10 mL via EPIDURAL
  Administered 2019-09-01: 3 mL via EPIDURAL

## 2019-08-31 MED ORDER — LACTATED RINGERS IV SOLN: INTRAVENOUS | Status: DC

## 2019-08-31 MED ORDER — OXYTOCIN 40 UNITS IN NORMAL SALINE INFUSION - SIMPLE MED
1.0000 m[IU]/min | INTRAVENOUS | Status: DC
  Administered 2019-08-31: 22:00:00 2 m[IU]/min via INTRAVENOUS
  Filled 2019-08-31: qty 1000

## 2019-08-31 MED ORDER — OXYTOCIN BOLUS FROM INFUSION
500.0000 mL | Freq: Once | INTRAVENOUS | Status: AC
  Administered 2019-09-01: 18:00:00 500 mL via INTRAVENOUS

## 2019-08-31 MED ORDER — TERBUTALINE SULFATE 1 MG/ML IJ SOLN: 0.2500 mg | Freq: Once | INTRAMUSCULAR | Status: DC | PRN

## 2019-08-31 MED ORDER — SOD CITRATE-CITRIC ACID 500-334 MG/5ML PO SOLN: 30.0000 mL | ORAL | Status: DC | PRN

## 2019-08-31 MED ORDER — ACETAMINOPHEN 325 MG PO TABS
650.0000 mg | ORAL_TABLET | ORAL | Status: DC | PRN
  Administered 2019-09-01: 11:00:00 650 mg via ORAL
  Filled 2019-08-31: qty 2

## 2019-08-31 MED ORDER — OXYCODONE-ACETAMINOPHEN 5-325 MG PO TABS: 2.0000 | ORAL_TABLET | ORAL | Status: DC | PRN

## 2019-08-31 MED ORDER — LACTATED RINGERS IV SOLN
500.0000 mL | INTRAVENOUS | Status: DC | PRN
  Administered 2019-09-01 (×2): 500 mL via INTRAVENOUS

## 2019-08-31 MED ORDER — PENICILLIN G POT IN DEXTROSE 60000 UNIT/ML IV SOLN
3.0000 10*6.[IU] | INTRAVENOUS | Status: DC
  Administered 2019-09-01 (×3): 3 10*6.[IU] via INTRAVENOUS
  Filled 2019-08-31 (×2): qty 50

## 2019-08-31 MED ORDER — ONDANSETRON HCL 4 MG/2ML IJ SOLN: 4.0000 mg | Freq: Four times a day (QID) | INTRAMUSCULAR | Status: DC | PRN

## 2019-08-31 MED ORDER — OXYTOCIN 40 UNITS IN NORMAL SALINE INFUSION - SIMPLE MED: 2.5000 [IU]/h | INTRAVENOUS | Status: DC

## 2019-08-31 MED ORDER — FENTANYL CITRATE (PF) 100 MCG/2ML IJ SOLN: 100.0000 ug | Freq: Once | INTRAMUSCULAR | Status: DC

## 2019-08-31 MED ORDER — FENTANYL CITRATE (PF) 100 MCG/2ML IJ SOLN
INTRAMUSCULAR | Status: AC
  Filled 2019-08-31: qty 2

## 2019-08-31 MED ORDER — OXYCODONE-ACETAMINOPHEN 5-325 MG PO TABS: 1.0000 | ORAL_TABLET | ORAL | Status: DC | PRN

## 2019-08-31 MED ORDER — LIDOCAINE HCL (PF) 1 % IJ SOLN: 30.0000 mL | INTRAMUSCULAR | Status: DC | PRN

## 2019-08-31 MED ORDER — MORPHINE SULFATE (PF) 0.5 MG/ML IJ SOLN
INTRAMUSCULAR | Status: AC
  Filled 2019-08-31: qty 10

## 2019-08-31 MED ORDER — DEXAMETHASONE SODIUM PHOSPHATE 4 MG/ML IJ SOLN
INTRAMUSCULAR | Status: AC
  Filled 2019-08-31: qty 1

## 2019-08-31 MED ORDER — TERBUTALINE SULFATE 1 MG/ML IJ SOLN
INTRAMUSCULAR | Status: AC
  Filled 2019-08-31: qty 1

## 2019-08-31 NOTE — Progress Notes (Signed)
Patient being transferred to PACU with Junie Panning, RN, report given, IV fluids infusing.

## 2019-08-31 NOTE — Anesthesia Procedure Notes (Signed)
Epidural Patient location during procedure: OB Start time: 08/31/2019 8:12 AM End time: 08/31/2019 8:22 AM  Staffing Anesthesiologist: Lidia Collum, MD Performed: anesthesiologist   Preanesthetic Checklist Completed: patient identified, IV checked, risks and benefits discussed, monitors and equipment checked, pre-op evaluation and timeout performed  Epidural Patient position: sitting Prep: DuraPrep Patient monitoring: heart rate, continuous pulse ox and blood pressure Approach: midline Location: L3-L4 Injection technique: LOR air  Needle:  Needle type: Tuohy  Needle gauge: 17 G Needle length: 9 cm Needle insertion depth: 5 cm Catheter type: closed end flexible Catheter size: 19 Gauge Catheter at skin depth: 10 cm Test dose: negative  Assessment Events: blood not aspirated, injection not painful, no injection resistance, no paresthesia and negative IV test  Additional Notes Reason for block:procedure for pain

## 2019-08-31 NOTE — Progress Notes (Signed)
OB PN:  S: Pt feeling only mild contractions.  Still has throbbing headache- at baseline  O: BP 120/64   Pulse 83   Temp 98.4 F (36.9 C) (Oral)   Resp 18   Ht 5\' 10"  (1.778 m)   Wt 126.1 kg   LMP 04/30/2018   SpO2 99%   BMI 39.89 kg/m   FHT: 140bpm, moderate variablity, + accels, no decels Toco: irregular SVE: deferred  A/P: 24 y.o. G3P1011 @ [redacted]w[redacted]d for IOL due to preeclampsia with severe features/intractable headache 1. FWB: Cat. I 2. Labor: continue with cytotec per protocol Pain: epidural in place GBS: positive, plan for PCN once either ruptured or active labor Preeclampsia with severe features  BP within normal limits, continue Labetalol 400mg  bid with parameters  Plan for Magnesium either postpartum or sooner if BP starts to rise  Janyth Pupa, DO [phone] (cell) [phone] (office)

## 2019-08-31 NOTE — Progress Notes (Signed)
OB PN:  S: Pt feeling only mild contractions.  Headache about the same  O: BP (!) 113/50   Pulse 94   Temp 98.9 F (37.2 C) (Oral)   Resp 18   Ht 5\' 10"  (1.778 m)   Wt 126.1 kg   LMP 04/30/2018   SpO2 99%   BMI 39.89 kg/m   FHT: 140bpm, moderate variablity, + accels, no decels Toco: irregular SVE: 1/25/-3, vertex on exam  A/P: 24 y.o. G3P1011 @ [redacted]w[redacted]d for IOL due to preeclampsia with severe features/intractable headache 1. FWB: Cat. I 2. Labor: cytotec #3 placed, will plan to transition to Pitocin and if possible will place [facility] balloon Pain: epidural in place GBS: positive, plan for PCN once transitioned to Pitocin Preeclampsia with severe features  BP low, will plan to hold on Labetalol and monitor closely  Plan for Magnesium either postpartum or sooner if BP starts to rise  Janyth Pupa, DO [phone] (cell) [phone] (office)

## 2019-08-31 NOTE — Anesthesia Preprocedure Evaluation (Deleted)
Anesthesia Evaluation  Patient identified by MRN, date of birth, ID band Patient awake    Reviewed: Allergy & Precautions, H&P , NPO status , Patient's Chart, lab work & pertinent test results  History of Anesthesia Complications Negative for: history of anesthetic complications  Airway Mallampati: II  TM Distance: >3 FB Neck ROM: full    Dental no notable dental hx.    Pulmonary neg pulmonary ROS, former smoker,    Pulmonary exam normal        Cardiovascular hypertension, Normal cardiovascular exam Rhythm:regular Rate:Normal     Neuro/Psych  Headaches, negative psych ROS   GI/Hepatic negative GI ROS, Neg liver ROS,   Endo/Other  negative endocrine ROS  Renal/GU negative Renal ROS  negative genitourinary   Musculoskeletal   Abdominal   Peds  Hematology negative hematology ROS (+)   Anesthesia Other Findings Preeclampsia with severe features, intractable headache  Reproductive/Obstetrics (+) Pregnancy                            Anesthesia Physical Anesthesia Plan  ASA: II  Anesthesia Plan: Epidural   Post-op Pain Management:    Induction:   PONV Risk Score and Plan:   Airway Management Planned:   Additional Equipment:   Intra-op Plan:   Post-operative Plan:   Informed Consent: I have reviewed the patients History and Physical, chart, labs and discussed the procedure including the risks, benefits and alternatives for the proposed anesthesia with the patient or authorized representative who has indicated his/her understanding and acceptance.       Plan Discussed with:   Anesthesia Plan Comments:         Anesthesia Quick Evaluation

## 2019-08-31 NOTE — Progress Notes (Signed)
Antepartum Note- HD#5  S: Called to patient's room this am for worsening headache early this am.  States she has return of diffuse 10/10 headache.  Also notes nausea/vomiting x 1 this am.  No change in vision. No ctx/vb/lof, +FM.  O: BP 113/77 (BP Location: Right Arm)   Pulse 92   Temp 98.4 F (36.9 C) (Oral)   Resp 18   Ht 5\' 10"  (1.778 m)   Wt 126.1 kg   LMP 04/30/2018   SpO2 98%   BMI 39.89 kg/m   BP range: [id number]  Gen: resting comfortably, no acute distress CV: RRR Lungs: CTAB Abd: gravid, no RUQ tenderness to palpation Ext: minimal edema, no clonus, SCDs in place  FHT: 130bpm, moderate variability, +accels, no decels Toco: no contractions SVE: deferred  [place] 08/27/19: Pilar Plate breech/fundal/EFW: 5#1oz (95%), AFI: 19 BPP: 1/27: 6/8  Labs:  24hr urine: 184 No results found for this or any previous visit (from the past 24 hour(s)).   A/P: 24yo G3P1011 @ [redacted]w[redacted]d admitted for preeclampsia with severe features- HD#5  -Preeclampsia with severe features  -s/p IV Magnesium  -BP within normal limits, currently on Labetalol 400mg  bid  -labs stable, will continue Mon/Thurs  -s/p NICU consult  -Neurology consulted- no abnormalities on MRI.  Some improvement with depakote and compazine; however headache has returned.  Reviewed with MFM in light of continued and worsening headache difficult to differentiate migraine vs worsening preeclampsia.  Plan to proceed towards delivery  -FWB- NICHD- Cat. I  -Cat. I, continue shift monitoring  -BPP twice weekly  -BMZ course completed  -Frank breech presentation- options reviewed with patient- discussed plan for ECV.  If unsuccessful, she is aware that a primary C-section would be warranted.  -Maternal care  -NPO  -SCDs to [place], DO [phone] (cell) [phone] (office)

## 2019-08-31 NOTE — Anesthesia Preprocedure Evaluation (Addendum)
Anesthesia Evaluation  Patient identified by MRN, date of birth, ID band Patient awake    Reviewed: Allergy & Precautions, H&P , NPO status , Patient's Chart, lab work & pertinent test results  History of Anesthesia Complications Negative for: history of anesthetic complications  Airway Mallampati: II  TM Distance: >3 FB Neck ROM: full    Dental no notable dental hx.    Pulmonary neg pulmonary ROS, former smoker,    Pulmonary exam normal        Cardiovascular hypertension, Normal cardiovascular exam Rhythm:regular Rate:Normal     Neuro/Psych  Headaches, negative psych ROS   GI/Hepatic negative GI ROS, Neg liver ROS,   Endo/Other  negative endocrine ROS  Renal/GU negative Renal ROS  negative genitourinary   Musculoskeletal   Abdominal   Peds  Hematology negative hematology ROS (+)   Anesthesia Other Findings Preeclampsia with severe features, intractable headache  Reproductive/Obstetrics (+) Pregnancy                             Anesthesia Physical  Anesthesia Plan  ASA: II  Anesthesia Plan: Epidural   Post-op Pain Management:    Induction:   PONV Risk Score and Plan:   Airway Management Planned: Natural Airway  Additional Equipment: None  Intra-op Plan:   Post-operative Plan:   Informed Consent: I have reviewed the patients History and Physical, chart, labs and discussed the procedure including the risks, benefits and alternatives for the proposed anesthesia with the patient or authorized representative who has indicated his/her understanding and acceptance.       Plan Discussed with:   Anesthesia Plan Comments: (Epidural placement for version - if successful, will proceed to labor induction and use epidural for labor, if unsuccessful, will proceed to Caesarean section.)       Anesthesia Quick Evaluation

## 2019-08-31 NOTE — Procedures (Signed)
External Cephalic Version Procedure Note  Patient Name: Chelsea Day Date of admission: 08/31/19 Date of procedure: 08/31/19  Surgeon: Dr. Janyth Pupa Assistant: Dr. Cyndia Skeeters  Procedure performed: External cephalic version   Procedure description:  After informed consent was obtained a time out was held and the procedure was verified. FHT were monitored prior to the start of the procedure and were found to be NICHD cat I. A BSUS was performed and verified that the infant was in complete breech presentation with sacrum posterior and fetal head maternal right. Epidural was placed prior to the start of the procedure. The breech was disengaged from the maternal pelvis and the fetus was rotated in a backward somersault fashion. The fetus rotated to vertex presentation after a single attempt. FHT were monitored and found to be reassuring.  Plan to transfer patient to L&D for induction of labor  Janyth Pupa, DO [phone] (cell) [phone] (office)

## 2019-09-01 ENCOUNTER — Encounter (HOSPITAL_COMMUNITY): Payer: Self-pay | Admitting: Obstetrics and Gynecology

## 2019-09-01 LAB — COMPREHENSIVE METABOLIC PANEL
ALT: 14 U/L (ref 0–44)
AST: 13 U/L — ABNORMAL LOW (ref 15–41)
Albumin: 2.6 g/dL — ABNORMAL LOW (ref 3.5–5.0)
Alkaline Phosphatase: 131 U/L — ABNORMAL HIGH (ref 38–126)
Anion gap: 10 (ref 5–15)
BUN: 7 mg/dL (ref 6–20)
CO2: 22 mmol/L (ref 22–32)
Calcium: 8.7 mg/dL — ABNORMAL LOW (ref 8.9–10.3)
Chloride: 103 mmol/L (ref 98–111)
Creatinine, Ser: 0.62 mg/dL (ref 0.44–1.00)
GFR calc Af Amer: >60 mL/min (ref >60)
GFR calc non Af Amer: >60 mL/min (ref >60)
Glucose, Bld: 78 mg/dL (ref 70–99)
Potassium: 4.1 mmol/L (ref 3.5–5.1)
Sodium: 135 mmol/L (ref 135–145)
Total Bilirubin: 0.7 mg/dL (ref 0.3–1.2)
Total Protein: 5.8 g/dL — ABNORMAL LOW (ref 6.5–8.1)

## 2019-09-01 LAB — PROTEIN / CREATININE RATIO, URINE
Creatinine, Urine: 134.79 mg/dL
Protein Creatinine Ratio: 0.13 mg/mg{Cre} (ref 0.00–0.15)
Total Protein, Urine: 18 mg/dL

## 2019-09-01 LAB — CBC
HCT: 32.3 % — ABNORMAL LOW (ref 36.0–46.0)
Hemoglobin: 10.4 g/dL — ABNORMAL LOW (ref 12.0–15.0)
MCH: 27.6 pg (ref 26.0–34.0)
MCHC: 32.2 g/dL (ref 30.0–36.0)
MCV: 85.7 fL (ref 80.0–100.0)
Platelets: 284 10*3/uL (ref 150–400)
RBC: 3.77 MIL/uL — ABNORMAL LOW (ref 3.87–5.11)
RDW: 13.6 % (ref 11.5–15.5)
WBC: 17.4 10*3/uL — ABNORMAL HIGH (ref 4.0–10.5)
nRBC: 0 % (ref 0.0–0.2)

## 2019-09-01 MED ORDER — SIMETHICONE 80 MG PO CHEW: 80.0000 mg | CHEWABLE_TABLET | ORAL | Status: DC | PRN

## 2019-09-01 MED ORDER — EPHEDRINE 5 MG/ML INJ: 10.0000 mg | INTRAVENOUS | Status: DC | PRN

## 2019-09-01 MED ORDER — LABETALOL HCL 5 MG/ML IV SOLN: 40.0000 mg | INTRAVENOUS | Status: DC | PRN

## 2019-09-01 MED ORDER — PHENYLEPHRINE 40 MCG/ML (10ML) SYRINGE FOR IV PUSH (FOR BLOOD PRESSURE SUPPORT): 80.0000 ug | PREFILLED_SYRINGE | INTRAVENOUS | Status: DC | PRN

## 2019-09-01 MED ORDER — LACTATED RINGERS IV SOLN: 500.0000 mL | Freq: Once | INTRAVENOUS | Status: DC

## 2019-09-01 MED ORDER — LABETALOL HCL 5 MG/ML IV SOLN: 20.0000 mg | INTRAVENOUS | Status: DC | PRN

## 2019-09-01 MED ORDER — ONDANSETRON HCL 4 MG/2ML IJ SOLN: 4.0000 mg | INTRAMUSCULAR | Status: DC | PRN

## 2019-09-01 MED ORDER — SENNOSIDES-DOCUSATE SODIUM 8.6-50 MG PO TABS
2.0000 | ORAL_TABLET | ORAL | Status: DC
  Administered 2019-09-02 – 2019-09-03 (×2): 2 via ORAL
  Filled 2019-09-01 (×2): qty 2

## 2019-09-01 MED ORDER — IBUPROFEN 600 MG PO TABS
600.0000 mg | ORAL_TABLET | Freq: Four times a day (QID) | ORAL | Status: DC
  Administered 2019-09-02 – 2019-09-03 (×7): 600 mg via ORAL
  Filled 2019-09-01 (×7): qty 1

## 2019-09-01 MED ORDER — MAGNESIUM SULFATE 40 GM/1000ML IV SOLN
2.0000 g/h | INTRAVENOUS | Status: AC
  Administered 2019-09-01 – 2019-09-02 (×2): 2 g/h via INTRAVENOUS
  Filled 2019-09-01 (×2): qty 1000

## 2019-09-01 MED ORDER — MAGNESIUM SULFATE BOLUS VIA INFUSION
4.0000 g | Freq: Once | INTRAVENOUS | Status: AC
  Administered 2019-09-01: 19:00:00 4 g via INTRAVENOUS
  Filled 2019-09-01: qty 1000

## 2019-09-01 MED ORDER — FENTANYL-BUPIVACAINE-NACL 0.5-0.125-0.9 MG/250ML-% EP SOLN
12.0000 mL/h | EPIDURAL | Status: DC | PRN
  Administered 2019-09-01: 16:00:00 12 mL/h via EPIDURAL
  Filled 2019-09-01 (×2): qty 250

## 2019-09-01 MED ORDER — LABETALOL HCL 5 MG/ML IV SOLN: 80.0000 mg | INTRAVENOUS | Status: DC | PRN

## 2019-09-01 MED ORDER — FENTANYL CITRATE (PF) 100 MCG/2ML IJ SOLN
50.0000 ug | INTRAMUSCULAR | Status: DC | PRN
  Filled 2019-09-01: qty 2

## 2019-09-01 MED ORDER — PRENATAL MULTIVITAMIN CH
1.0000 | ORAL_TABLET | Freq: Every day | ORAL | Status: DC
  Administered 2019-09-02 – 2019-09-03 (×2): 1 via ORAL
  Filled 2019-09-01 (×2): qty 1

## 2019-09-01 MED ORDER — HYDRALAZINE HCL 50 MG PO TABS: 25.0000 mg | ORAL_TABLET | Freq: Four times a day (QID) | ORAL | Status: DC | PRN

## 2019-09-01 MED ORDER — DIPHENHYDRAMINE HCL 25 MG PO CAPS: 25.0000 mg | ORAL_CAPSULE | Freq: Four times a day (QID) | ORAL | Status: DC | PRN

## 2019-09-01 MED ORDER — WITCH HAZEL-GLYCERIN EX PADS: 1.0000 "application " | MEDICATED_PAD | CUTANEOUS | Status: DC | PRN

## 2019-09-01 MED ORDER — BENZOCAINE-MENTHOL 20-0.5 % EX AERO
1.0000 "application " | INHALATION_SPRAY | CUTANEOUS | Status: DC | PRN
  Filled 2019-09-01: qty 56

## 2019-09-01 MED ORDER — DIPHENHYDRAMINE HCL 50 MG/ML IJ SOLN: 12.5000 mg | INTRAMUSCULAR | Status: DC | PRN

## 2019-09-01 MED ORDER — COCONUT OIL OIL: 1.0000 "application " | TOPICAL_OIL | Status: DC | PRN

## 2019-09-01 MED ORDER — SODIUM CHLORIDE (PF) 0.9 % IJ SOLN
INTRAMUSCULAR | Status: DC | PRN
  Administered 2019-09-01: 12 mL/h via EPIDURAL

## 2019-09-01 MED ORDER — ACETAMINOPHEN 325 MG PO TABS: 650.0000 mg | ORAL_TABLET | ORAL | Status: DC | PRN

## 2019-09-01 MED ORDER — ZOLPIDEM TARTRATE 5 MG PO TABS: 5.0000 mg | ORAL_TABLET | Freq: Every evening | ORAL | Status: DC | PRN

## 2019-09-01 MED ORDER — SODIUM CHLORIDE 0.9 % IV SOLN: 5.0000 10*6.[IU] | Freq: Once | INTRAVENOUS | Status: DC

## 2019-09-01 MED ORDER — TETANUS-DIPHTH-ACELL PERTUSSIS 5-2.5-18.5 LF-MCG/0.5 IM SUSP: 0.5000 mL | Freq: Once | INTRAMUSCULAR | Status: DC

## 2019-09-01 MED ORDER — DIBUCAINE (PERIANAL) 1 % EX OINT: 1.0000 "application " | TOPICAL_OINTMENT | CUTANEOUS | Status: DC | PRN

## 2019-09-01 MED ORDER — LACTATED RINGERS IV SOLN: INTRAVENOUS | Status: DC

## 2019-09-01 MED ORDER — ONDANSETRON HCL 4 MG PO TABS: 4.0000 mg | ORAL_TABLET | ORAL | Status: DC | PRN

## 2019-09-01 MED ORDER — PENICILLIN G POT IN DEXTROSE 60000 UNIT/ML IV SOLN
3.0000 10*6.[IU] | INTRAVENOUS | Status: DC
  Administered 2019-09-01: 11:00:00 3 10*6.[IU] via INTRAVENOUS
  Filled 2019-09-01 (×2): qty 50

## 2019-09-01 NOTE — Progress Notes (Signed)
OB PN:  S: Resting more comfortably with epidural this am  O: BP 120/71   Pulse 85   Temp 98.5 F (36.9 C) (Axillary)   Resp 14   Ht 5\' 10"  (1.778 m)   Wt 126.1 kg   LMP 04/30/2018   SpO2 96%   BMI 39.89 kg/m   FHT: 135bpm, moderate variablity, + accels, no decels Toco: q50min SVE: 1.5/25/-3, Cook balloon placed  A/P: 24 y.o. [id number] @ [redacted]w[redacted]d for IOL due to preeclampsia with severe features/intractable headache s/p successful version 1. FWB: Cat. I 2. Labor: Lacinda Axon placed, continue Pit per protocol Pain: epidural in place GBS: positive, continue Pit per protocol Preeclampsia with severe features  BP low, will plan to hold on Labetalol and monitor closely  Plan for Magnesium either postpartum or sooner if BP starts to rise  Repeat labs this am Janyth Pupa, DO [phone] (cell) [phone] (office)

## 2019-09-01 NOTE — Progress Notes (Signed)
OB PN:  S: Resting more comfortably with epidural this am.  Reports no headache currently  O: BP (!) 115/46   Pulse 71   Temp 98.5 F (36.9 C) (Axillary)   Resp 16   Ht 5\' 10"  (1.778 m)   Wt 126.1 kg   LMP 04/30/2018   SpO2 96%   BMI 39.89 kg/m   FHT: 140bpm, moderate variablity, + accels, no decels Toco: difficulty tracing contractions SVE: 4/50/-2, AROM clear fluid, IUPC placed  Results for orders placed or performed during the hospital encounter of 08/27/19 (from the past 24 hour(s))  Protein / creatinine ratio, urine     Status: None   Collection Time: 09/01/19  6:37 AM  Result Value Ref Range   Creatinine, Urine 134.79 mg/dL   Total Protein, Urine 18 mg/dL   Protein Creatinine Ratio 0.13 0.00 - 0.15 mg/mg[Cre]  Comprehensive metabolic panel     Status: Abnormal   Collection Time: 09/01/19  7:54 AM  Result Value Ref Range   Sodium 135 135 - 145 mmol/L   Potassium 4.1 3.5 - 5.1 mmol/L   Chloride 103 98 - 111 mmol/L   CO2 22 22 - 32 mmol/L   Glucose, Bld 78 70 - 99 mg/dL   BUN 7 6 - 20 mg/dL   Creatinine, Ser 0.62 0.44 - 1.00 mg/dL   Calcium 8.7 (L) 8.9 - 10.3 mg/dL   Total Protein 5.8 (L) 6.5 - 8.1 g/dL   Albumin 2.6 (L) 3.5 - 5.0 g/dL   AST 13 (L) 15 - 41 U/L   ALT 14 0 - 44 U/L   Alkaline Phosphatase 131 (H) 38 - 126 U/L   Total Bilirubin 0.7 0.3 - 1.2 mg/dL   GFR calc non Af Amer >60 >60 mL/min   GFR calc Af Amer >60 >60 mL/min   Anion gap 10 5 - 15  CBC     Status: Abnormal   Collection Time: 09/01/19  7:54 AM  Result Value Ref Range   WBC 17.4 (H) 4.0 - 10.5 K/uL   RBC 3.77 (L) 3.87 - 5.11 MIL/uL   Hemoglobin 10.4 (L) 12.0 - 15.0 g/dL   HCT 32.3 (L) 36.0 - 46.0 %   MCV 85.7 80.0 - 100.0 fL   MCH 27.6 26.0 - 34.0 pg   MCHC 32.2 30.0 - 36.0 g/dL   RDW 13.6 11.5 - 15.5 %   Platelets 284 150 - 400 K/uL   nRBC 0.0 0.0 - 0.2 %     A/P: 24 y.o. [id number] @ [redacted]w[redacted]d for IOL due to preeclampsia with severe features/intractable headache s/p successful  version 1. FWB: Cat. I 2. Labor: continue Pit per protocol, IUPC placed for better monitoring of contractions and titration of Pitocin Pain: epidural in place GBS: positive, continue PCN per protocol Preeclampsia with severe features  BP low, will plan to hold on Labetalol and monitor closely  Plan for Magnesium either postpartum or sooner if BP starts to rise  Labs stable as above  Janyth Pupa, DO [phone] (cell) [phone] (office)

## 2019-09-02 LAB — CBC
HCT: 31 % — ABNORMAL LOW (ref 36.0–46.0)
Hemoglobin: 10 g/dL — ABNORMAL LOW (ref 12.0–15.0)
MCH: 27.3 pg (ref 26.0–34.0)
MCHC: 32.3 g/dL (ref 30.0–36.0)
MCV: 84.7 fL (ref 80.0–100.0)
Platelets: 299 10*3/uL (ref 150–400)
RBC: 3.66 MIL/uL — ABNORMAL LOW (ref 3.87–5.11)
RDW: 13.3 % (ref 11.5–15.5)
WBC: 17.1 10*3/uL — ABNORMAL HIGH (ref 4.0–10.5)
nRBC: 0 % (ref 0.0–0.2)

## 2019-09-02 NOTE — Lactation Note (Signed)
This note was copied from a baby's chart. Lactation Consultation Note  Patient Name: Chelsea Day [id number] Date: 09/02/2019  P2, 7 hour pretem NICU infant. In discussion with RN,  mom brought her own personal DEBP from home and will start pumping tomorrow morning. LC did not talk with mom at this time she is asleep.    Maternal Data    Feeding Feeding Type: Breast Milk  LATCH Score                   Interventions    Lactation Tools Discussed/Used     Consult Status      Vicente Serene 09/02/2019, 1:47 AM

## 2019-09-02 NOTE — Progress Notes (Signed)
Postpartum Note Day # 1  S:  Patient resting comfortable in bed.  Feeling much better today- headache, chest pain/RUQ pain has now resolved.  No acute complaints.  Pain controlled.  Tolerating general diet. + flatus, no BM.  Lochia moderate.  Ambulating without difficulty.  She denies n/v/f/c, SOB, or CP.  Pt plans on pumping- baby in NICU  O: Temp:  [97.8 F (36.6 C)-99.4 F (37.4 C)] 97.8 F (36.6 C) (01/31 0806) Pulse Rate:  [63-106] 63 (01/31 0806) Resp:  [14-20] 18 (01/31 0806) BP: (103-135)/(46-91) 121/61 (01/31 0806) SpO2:  [94 %-100 %] 99 % (01/31 0806)  Gen: A&Ox3, NAD CV: RRR, no MRG Resp: CTAB Abdomen: soft, NT, ND Uterus: firm, non-tender, below umbilicus Ext: No edema, no calf tenderness bilaterally, SCDs in place  Labs:  Recent Labs    09/01/19 0754 09/02/19 0439  HGB 10.4* 10.0*    A/P: Pt is a 24 y.o. [id number] s/p NSVD, PPD#1 -Preeclampsia  Currently on Mag x 24hr  BP within normal limits, no medication currently  Labs stable  Headache now resolved - Pain well controlled -GU: Adequate UOP, diuresis noted -GI: Tolerating general diet -Activity: encouraged sitting up to chair and ambulation as tolerated -Prophylaxis: early ambulation  DISPO: Continue with routine postpartum care, if BP remains stable, will plan for discharge home tomorrow  Janyth Pupa, DO [phone] (cell) [phone] (office)

## 2019-09-02 NOTE — Anesthesia Postprocedure Evaluation (Signed)
Anesthesia Post Note  Patient: Alhana Cortorreal  Procedure(s) Performed: AN AD HOC LABOR EPIDURAL     Patient location during evaluation: Mother Baby Anesthesia Type: Epidural Level of consciousness: awake and alert Pain management: pain level controlled Vital Signs Assessment: post-procedure vital signs reviewed and stable Respiratory status: spontaneous breathing, nonlabored ventilation and respiratory function stable Cardiovascular status: stable Postop Assessment: no headache, no backache, epidural receding, no apparent nausea or vomiting, patient able to bend at knees, adequate PO intake and able to ambulate Anesthetic complications: no    Last Vitals:  Vitals:   09/02/19 0505 09/02/19 0600  BP: (!) 115/55 125/67  Pulse: 73 71  Resp: 20 19  Temp: 36.7 C   SpO2: 98%     Last Pain:  Vitals:   09/02/19 0600  TempSrc:   PainSc: 0-No pain   Pain Goal: Patients Stated Pain Goal: 0 (09/01/19 0551)                 Jabier Mutton

## 2019-09-02 NOTE — Lactation Note (Signed)
This note was copied from a baby's chart. Lactation Consultation Note  Patient Name: Girl Tarnisha Farrell [id number] Date: 09/02/2019 Reason for consult: Initial assessment;Preterm <34wks;NICU baby  LC in to visit with P2 Mom of preterm baby in the NICU.  Baby 20 hrs old and Mom has been using her Motif DEBP from home.  Mom has expressed colostrum containers to take to NICU.   Mom is on MgSO4 for GHTN.  Mom encouraged to use Medela Symphony DEBP due to hospital grade pumps are preferable to establish a full milk supply.  Mom aware that baby's room has a Symphony pump for her to use.   Encouraged breast massage and hand expression.  Encouraged to double pump 8-12 times per 24 hrs.   Mom aware of IP and OP lactation services available to her.   Lactation brochure given and NICU booklet given at bedside.  Set up DEBP and assisted Mom to use Symphony pump.   Interventions Interventions: Breast feeding basics reviewed;DEBP;Skin to skin;Breast massage;Hand express  Lactation Tools Discussed/Used Tools: Pump Breast pump type: Double-Electric Breast Pump WIC Program: No Pump Review: Setup, frequency, and cleaning;Milk Storage Initiated by:: Cipriano Mile RN IBCLC Date initiated:: 09/02/19   Consult Status Consult Status: Follow-up Date: 09/03/19 Follow-up type: In-patient    Broadus John 09/02/2019, 12:19 PM

## 2019-09-03 MED ORDER — IBUPROFEN 600 MG PO TABS: 600.0000 mg | ORAL_TABLET | Freq: Four times a day (QID) | ORAL | 0 refills | Status: DC

## 2019-09-03 MED ORDER — SERTRALINE HCL 50 MG PO TABS: 50.0000 mg | ORAL_TABLET | Freq: Every day | ORAL | 5 refills | Status: DC

## 2019-09-03 MED ORDER — MEASLES, MUMPS & RUBELLA VAC IJ SOLR
0.5000 mL | Freq: Once | INTRAMUSCULAR | Status: AC
  Administered 2019-09-03: 0.5 mL via SUBCUTANEOUS
  Filled 2019-09-03: qty 0.5

## 2019-09-03 NOTE — Lactation Note (Signed)
This note was copied from a baby's chart. Lactation Consultation Note  Patient Name: Chelsea Day [id number] Date: 09/03/2019 Reason for consult: Follow-up assessment;Preterm <34wks;Infant < 6lbs;NICU baby  P2 mother whose infant is now 20 hours old.  This is a preterm baby at 32+3 weeks with a CGA of 32+5 weeks weighing < 6 lbs and in the NICU.  Per mother, baby may be closer to 34-35 weeks due to size and Ballard score.  Mother breast fed her first child (now 39 years old) for 2 1/2 years.  Parents have visited infant this morning and mother is now ready to pump.  She has her personal Motif pump at the bedside but is using our pump while here in the hospital; encouraged her to continue using the hospital grade pump until she is at home.  Mother has been pumping every three hours and has been incorporating warm compresses, breast massage and hand expression before  pumping.  I encouraged her to continue doing this as well as performing hand expression after pumping to help increase milk supply.  She is highly motivated and wants to provide her EBM as soon as possible for baby.    Reminded mother that she is able to pump in the NICU at baby's bedside as desired.  She had no further questions related to pumping and has all containers, supplies and labels.  Father present and supportive.  Informed mother that a Pioneer will be available to assist as needed in the NICU when baby is able to latch.  Mother appreciative.     Maternal Data Formula Feeding for Exclusion: No Has patient been taught Hand Expression?: Yes Does the patient have breastfeeding experience prior to this delivery?: Yes  Feeding Feeding Type: Breast Fed  LATCH Score Latch: Repeated attempts needed to sustain latch, nipple held in mouth throughout feeding, stimulation needed to elicit sucking reflex.  Audible Swallowing: None  Type of Nipple: Everted at rest and after stimulation  Comfort (Breast/Nipple): Soft /  non-tender  Hold (Positioning): No assistance needed to correctly position infant at breast.  LATCH Score: 7  Interventions Interventions: Skin to skin;Adjust position;Support pillows;Position options  Lactation Tools Discussed/Used Pump Review: Setup, frequency, and cleaning;Milk Storage(Reviewed)   Consult Status Consult Status: Follow-up Date: 09/04/19 Follow-up type: In-patient    Analiyah Lechuga R Kimiah Hibner 09/03/2019, 10:30 AM

## 2019-09-03 NOTE — Discharge Instructions (Signed)
Preeclampsia and Eclampsia °Preeclampsia is a serious condition that may develop during pregnancy. This condition causes high blood pressure and increased protein in your urine along with other symptoms, such as headaches and vision changes. These symptoms may develop as the condition gets worse. Preeclampsia may occur at 20 weeks of pregnancy or later. °Diagnosing and treating preeclampsia early is very important. If not treated early, it can cause serious problems for you and your baby. One problem it can lead to is eclampsia. Eclampsia is a condition that causes muscle jerking or shaking (convulsions or seizures) and other serious problems for the mother. During pregnancy, delivering your baby may be the best treatment for preeclampsia or eclampsia. For most women, preeclampsia and eclampsia symptoms go away after giving birth. °In rare cases, a woman may develop preeclampsia after giving birth (postpartum preeclampsia). This usually occurs within 48 hours after childbirth but may occur up to 6 weeks after giving birth. °What are the causes? °The cause of preeclampsia is not known. °What increases the risk? °The following risk factors make you more likely to develop preeclampsia: °· Being pregnant for the first time. °· Having had preeclampsia during a past pregnancy. °· Having a family history of preeclampsia. °· Having high blood pressure. °· Being pregnant with more than one baby. °· Being 35 or older. °· Being African-American. °· Having kidney disease or diabetes. °· Having medical conditions such as lupus or blood diseases. °· Being very overweight (obese). °What are the signs or symptoms? °The most common symptoms are: °· Severe headaches. °· Vision problems, such as blurred or double vision. °· Abdominal pain, especially upper abdominal pain. °Other symptoms that may develop as the condition gets worse include: °· Sudden weight gain. °· Sudden swelling of the hands, face, legs, and feet. °· Severe nausea  and vomiting. °· Numbness in the face, arms, legs, and feet. °· Dizziness. °· Urinating less than usual. °· Slurred speech. °· Convulsions or seizures. °How is this diagnosed? °There are no screening tests for preeclampsia. Your health care provider will ask you about symptoms and check for signs of preeclampsia during your prenatal visits. You may also have tests that include: °· Checking your blood pressure. °· Urine tests to check for protein. Your health care provider will check for this at every prenatal visit. °· Blood tests. °· Monitoring your baby's heart rate. °· Ultrasound. °How is this treated? °You and your health care provider will determine the treatment approach that is best for you. Treatment may include: °· Having more frequent prenatal exams to check for signs of preeclampsia, if you have an increased risk for preeclampsia. °· Medicine to lower your blood pressure. °· Staying in the hospital, if your condition is severe. There, treatment will focus on controlling your blood pressure and the amount of fluids in your body (fluid retention). °· Taking medicine (magnesium sulfate) to prevent seizures. This may be given as an injection or through an IV. °· Taking a low-dose aspirin during your pregnancy. °· Delivering your baby early. You may have your labor started with medicine (induced), or you may have a cesarean delivery. °Follow these instructions at home: °Eating and drinking ° °· Drink enough fluid to keep your urine pale yellow. °· Avoid caffeine. °Lifestyle °· Do not use any products that contain nicotine or tobacco, such as cigarettes and e-cigarettes. If you need help quitting, ask your health care provider. °· Do not use alcohol or drugs. °· Avoid stress as much as possible. Rest and get   plenty of sleep. General instructions  Take over-the-counter and prescription medicines only as told by your health care provider.  When lying down, lie on your left side. This keeps pressure off your  major blood vessels.  When sitting or lying down, raise (elevate) your feet. Try putting some pillows underneath your lower legs.  Exercise regularly. Ask your health care provider what kinds of exercise are best for you.  Keep all follow-up and prenatal visits as told by your health care provider. This is important. How is this prevented? There is no known way of preventing preeclampsia or eclampsia from developing. However, to lower your risk of complications and detect problems early:  Get regular prenatal care. Your health care provider may be able to diagnose and treat the condition early.  Maintain a healthy weight. Ask your health care provider for help managing weight gain during pregnancy.  Work with your health care provider to manage any long-term (chronic) health conditions you have, such as diabetes or kidney problems.  You may have tests of your blood pressure and kidney function after giving birth.  Your health care provider may have you take low-dose aspirin during your next pregnancy. Contact a health care provider if:  You have symptoms that your health care provider told you may require more treatment or monitoring, such as: ? Headaches. ? Nausea or vomiting. ? Abdominal pain. ? Dizziness. ? Light-headedness. Get help right away if:  You have severe: ? Abdominal pain. ? Headaches that do not get better. ? Dizziness. ? Vision problems. ? Confusion. ? Nausea or vomiting.  You have any of the following: ? A seizure. ? Sudden, rapid weight gain. ? Sudden swelling in your hands, ankles, or face. ? Trouble moving any part of your body. ? Numbness in any part of your body. ? Trouble speaking. ? Abnormal bleeding.  You faint. Summary  Preeclampsia is a serious condition that may develop during pregnancy.  This condition causes high blood pressure and increased protein in your urine along with other symptoms, such as headaches and vision  changes.  Diagnosing and treating preeclampsia early is very important. If not treated early, it can cause serious problems for you and your baby.  Get help right away if you have symptoms that your health care provider told you to watch for. This information is not intended to replace advice given to you by your health care provider. Make sure you discuss any questions you have with your health care provider. Document Revised: 03/21/2018 Document Reviewed: 02/23/2016 Elsevier Patient Education  2020 [employer]  Postpartum Care After Vaginal Delivery This sheet gives you information about how to care for yourself from the time you deliver your baby to up to 6-12 weeks after delivery (postpartum period). Your health care provider may also give you more specific instructions. If you have problems or questions, contact your health care provider. Follow these instructions at home: Vaginal bleeding  It is normal to have vaginal bleeding (lochia) after delivery. Wear a sanitary pad for vaginal bleeding and discharge. ? During the first week after delivery, the amount and appearance of lochia is often similar to a menstrual period. ? Over the next few weeks, it will gradually decrease to a dry, yellow-brown discharge. ? For most women, lochia stops completely by 4-6 weeks after delivery. Vaginal bleeding can vary from woman to woman.  Change your sanitary pads frequently. Watch for any changes in your flow, such as: ? A sudden increase in volume. ? A   change in color. ? Large blood clots.  If you pass a blood clot from your vagina, save it and call your health care provider to discuss. Do not flush blood clots down the toilet before talking with your health care provider.  Do not use tampons or douches until your health care provider says this is safe.  If you are not breastfeeding, your period should return 6-8 weeks after delivery. If you are feeding your child breast milk only (exclusive  breastfeeding), your period may not return until you stop breastfeeding. Perineal care  Keep the area between the vagina and the anus (perineum) clean and dry as told by your health care provider. Use medicated pads and pain-relieving sprays and creams as directed.  If you had a cut in the perineum (episiotomy) or a tear in the vagina, check the area for signs of infection until you are healed. Check for: ? More redness, swelling, or pain. ? Fluid or blood coming from the cut or tear. ? Warmth. ? Pus or a bad smell.  You may be given a squirt bottle to use instead of wiping to clean the perineum area after you go to the bathroom. As you start healing, you may use the squirt bottle before wiping yourself. Make sure to wipe gently.  To relieve pain caused by an episiotomy, a tear in the vagina, or swollen veins in the anus (hemorrhoids), try taking a warm sitz bath 2-3 times a day. A sitz bath is a warm water bath that is taken while you are sitting down. The water should only come up to your hips and should cover your buttocks. Breast care  Within the first few days after delivery, your breasts may feel heavy, full, and uncomfortable (breast engorgement). Milk may also leak from your breasts. Your health care provider can suggest ways to help relieve the discomfort. Breast engorgement should go away within a few days.  If you are breastfeeding: ? Wear a bra that supports your breasts and fits you well. ? Keep your nipples clean and dry. Apply creams and ointments as told by your health care provider. ? You may need to use breast pads to absorb milk that leaks from your breasts. ? You may have uterine contractions every time you breastfeed for up to several weeks after delivery. Uterine contractions help your uterus return to its normal size. ? If you have any problems with breastfeeding, work with your health care provider or lactation consultant.  If you are not breastfeeding: ? Avoid  touching your breasts a lot. Doing this can make your breasts produce more milk. ? Wear a good-fitting bra and use cold packs to help with swelling. ? Do not squeeze out (express) milk. This causes you to make more milk. Intimacy and sexuality  Ask your health care provider when you can engage in sexual activity. This may depend on: ? Your risk of infection. ? How fast you are healing. ? Your comfort and desire to engage in sexual activity.  You are able to get pregnant after delivery, even if you have not had your period. If desired, talk with your health care provider about methods of birth control (contraception). Medicines  Take over-the-counter and prescription medicines only as told by your health care provider.  If you were prescribed an antibiotic medicine, take it as told by your health care provider. Do not stop taking the antibiotic even if you start to feel better. Activity  Gradually return to your normal activities as   told by your health care provider. Ask your health care provider what activities are safe for you.  Rest as much as possible. Try to rest or take a nap while your baby is sleeping. Eating and drinking   Drink enough fluid to keep your urine pale yellow.  Eat high-fiber foods every day. These may help prevent or relieve constipation. High-fiber foods include: ? Whole grain cereals and breads. ? Brown rice. ? Beans. ? Fresh fruits and vegetables.  Do not try to lose weight quickly by cutting back on calories.  Take your prenatal vitamins until your postpartum checkup or until your health care provider tells you it is okay to stop. Lifestyle  Do not use any products that contain nicotine or tobacco, such as cigarettes and e-cigarettes. If you need help quitting, ask your health care provider.  Do not drink alcohol, especially if you are breastfeeding. General instructions  Keep all follow-up visits for you and your baby as told by your health care  provider. Most women visit their health care provider for a postpartum checkup within the first 3-6 weeks after delivery. Contact a health care provider if:  You feel unable to cope with the changes that your child brings to your life, and these feelings do not go away.  You feel unusually sad or worried.  Your breasts become red, painful, or hard.  You have a fever.  You have trouble holding urine or keeping urine from leaking.  You have little or no interest in activities you used to enjoy.  You have not breastfed at all and you have not had a menstrual period for 12 weeks after delivery.  You have stopped breastfeeding and you have not had a menstrual period for 12 weeks after you stopped breastfeeding.  You have questions about caring for yourself or your baby.  You pass a blood clot from your vagina. Get help right away if:  You have chest pain.  You have difficulty breathing.  You have sudden, severe leg pain.  You have severe pain or cramping in your lower abdomen.  You bleed from your vagina so much that you fill more than one sanitary pad in one hour. Bleeding should not be heavier than your heaviest period.  You develop a severe headache.  You faint.  You have blurred vision or spots in your vision.  You have bad-smelling vaginal discharge.  You have thoughts about hurting yourself or your baby. If you ever feel like you may hurt yourself or others, or have thoughts about taking your own life, get help right away. You can go to the nearest emergency department or call:  Your local emergency services (911 in the [place]  A suicide crisis helpline, such as the National Suicide Prevention Lifeline at 1-800-273-8255. This is open 24 hours a day. Summary  The period of time right after you deliver your newborn up to 6-12 weeks after delivery is called the postpartum period.  Gradually return to your normal activities as told by your health care  provider.  Keep all follow-up visits for you and your baby as told by your health care provider. This information is not intended to replace advice given to you by your health care provider. Make sure you discuss any questions you have with your health care provider. Document Revised: 07/22/2017 Document Reviewed: 05/02/2017 Elsevier Patient Education  2020 [employer]  

## 2019-09-03 NOTE — Clinical Social Work Maternal (Signed)
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 [place] [address]    Phone number:  [phone] (home)     Additional phone number:   Household Members/Support Persons (HM/SP):   Household Member/Support Person 1, Household Member/Support Person 2   HM/SP Name Relationship DOB or Age  HM/SP -1 Richard Goytia FOB 12/13/1993  HM/SP -2 Mia Ohms daughter 08/13/16  HM/SP -3        HM/SP -4        HM/SP -5        HM/SP -6        HM/SP -7        HM/SP -8          Natural Supports (not living in the home):  Parent, Extended Family, Friends, Immediate Family(Per MOB and FOB, FOB's family will also provide support if needed.)   Professional Supports: None   Employment: Full-time   Type of Work: Copywriter, advertising   Education:  [place] arranged:    Museum/gallery curator Resources:  Multimedia programmer   Other Resources:    Cultural/Religious Considerations Which May Impact Care:  None Reported  Strengths:  Ability to meet basic needs , Home prepared for child , Understanding of illness, Pediatrician chosen   Psychotropic Medications:         Pediatrician:    [facility]  Pediatrician List:   [facility] (Peds)  [place]      Pediatrician Fax Number:    Risk Factors/Current Problems:  Mental Health Concerns    Cognitive State:  Able to Concentrate , Alert , Goal Oriented , Insightful ,  Linear Thinking    Mood/Affect:  Interested , Tearful , Calm , Happy , Relaxed    CSW Assessment: CSW met with MOB and FOB in room 107 to complete an assessment for hx of PPD and Edinburgh Score of 11. CSW explained CSW's role and MOB gave CSW permission to complete clinical assessment while FOB was present. The couple appeared supportive of one another and they both communicated their excitement of being new parents again.  MOB was easy to engage, forthcoming, and receptive to meeting with CSW.   CSW asked about MOB's thoughts and feelings regarding infant's NICU admission.  MOB reported tearfully that she has been emotionally.  MOB communicated, "It's like she was with me for the past 32 weeks and then all of a sudden I feel like she is taken away from me." CSW validated and normalized MOB's thoughts and feelings.  CSW also shared other emotions that MOB my experience during the postpartum period. CSW assessed for PMADs and MOB openly shared her experience. MOB acknowledge PPD symptoms that last approximately 9 months.  MOB reported daily crying and feeling "Low." MOB also shared having daily feelings of anger. Per MOB, MOB notified her OB provider and was prescribed Zoloft. MOB shared her symptoms subsided with the help of medication and  she felt "Normal." CSW reviewed MOB's Edinburgh score and provided  education regarding Baby Blues vs PMADs and provided MOB with resources for mental health follow up.  CSW encouraged MOB to evaluate her mental health throughout the postpartum period with the use of the New Mom Checklist developed by [facility] as well as the [place] Postnatal Depression Scale and notify a medical professional if symptoms arise. MOB presented with insight and awareness and did not display any acute symptoms. CSW assessed for safety and MOB denied SI and HI. MOB reports having a good support team and feels comfortable seeking help if help is warranted.   MOB and FOB  reports having all essential items to care for infant including a new car seat, crib, bassinet, and packn play.   MOB and is open to CSW following -up with family while infant remain in NICU.  CSW Plan/Description:  Psychosocial Support and Ongoing Assessment of Needs, Sudden Infant Death Syndrome (SIDS) Education, Perinatal Mood and Anxiety Disorder (PMADs) Education, Other Patient/Family Education, Other Information/Referral to [employer], MSW, [employer] Clinical Social Work [phone]   Dimple Nanas, LCSW 09/03/2019, 11:35 AM

## 2019-09-03 NOTE — Lactation Note (Signed)
This note was copied from a baby's chart. Lactation Consultation Note  Patient Name: Girl Mayre Tiernan [id number] Date: 09/03/2019 Reason for consult: Follow-up assessment   LC Follow Up Visit:  Attempted to visit with mother, however, she is not in her room.  Will follow up at a later time.    Feeding Feeding Type: Donor Breast Milk  LATCH Score                   Interventions    Lactation Tools Discussed/Used     Consult Status Consult Status: Follow-up Date: 09/03/19 Follow-up type: In-patient    Leianne Callins R Yoselin Amerman 09/03/2019, 8:10 AM

## 2019-09-03 NOTE — Discharge Summary (Signed)
OB Discharge Summary     Patient Name: Chelsea Day DOB: November 14, 1995 MRN: [id number]  Date of admission: 08/27/2019 Delivering MD: Janyth Pupa   Date of discharge: 09/03/2019  Admitting diagnosis: Preeclampsia, severe, third trimester [O14.13] Intrauterine pregnancy: [redacted]w[redacted]d     Secondary diagnosis:  Active Problems:   Preeclampsia, severe, third trimester   [redacted] weeks gestation of pregnancy   RUQ pain  Additional problems: H/o Migraine headaches, Anxiety     Discharge diagnosis: Preterm Pregnancy Delivered, Preeclampsia (severe) and H/o PP Depression.                                                                                                Post partum procedures:Magnesium x 24 hours postpartum  Augmentation: AROM, Pitocin, Cytotec and Foley Balloon  Complications: None  Hospital course:  Induction of Labor With Vaginal Delivery   24 y.o. yo [phone] at [redacted]w[redacted]d was admitted to the hospital 08/27/2019 for induction of labor.Pt failed Imitrex for headaches.  Recommended that pt be delivered on HD #5.  Pt had a successful external version from breech presentation.  Indication for induction: Preeclampsia and with severe features.  Patient had an uncomplicated labor course as follows: Membrane Rupture Time/Date: 12:16 PM ,09/01/2019   Intrapartum Procedures: Episiotomy: None [1]                                         Lacerations:  None [1]  Patient had delivery of a Viable infant.  Information for the patient's newborn:  Nusrat, Line Girl Kaelen [id number]  Delivery Method: Vaginal, Spontaneous(Filed from Delivery Summary)    09/01/2019  Details of delivery can be found in separate delivery note.  Patient had a routine postpartum course. Patient is discharged home 09/03/19.  Physical exam  Vitals:   09/03/19 0105 09/03/19 0417 09/03/19 0745 09/03/19 1231  BP: (!) 134/59 (!) 117/53 135/70 (!) 138/59  Pulse: 73 62 71 72  Resp: 17 18 18 17   Temp: 98.1 F (36.7 C) 98.2 F (36.8  C) 98.2 F (36.8 C) 98.6 F (37 C)  TempSrc: Oral Oral Oral Oral  SpO2: 97% 99% 99% 99%  Weight:      Height:       General: alert, cooperative and no distress Lochia: appropriate Uterine Fundus: firm Incision: N/A DVT Evaluation: No evidence of DVT seen on physical exam. Calf/Ankle edema is present Labs: Lab Results  Component Value Date   WBC 17.1 (H) 09/02/2019   HGB 10.0 (L) 09/02/2019   HCT 31.0 (L) 09/02/2019   MCV 84.7 09/02/2019   PLT 299 09/02/2019   CMP Latest Ref Rng & Units 09/01/2019  Glucose 70 - 99 mg/dL 78  BUN 6 - 20 mg/dL 7  Creatinine 0.44 - 1.00 mg/dL 0.62  Sodium 135 - 145 mmol/L 135  Potassium 3.5 - 5.1 mmol/L 4.1  Chloride 98 - 111 mmol/L 103  CO2 22 - 32 mmol/L 22  Calcium 8.9 - 10.3 mg/dL 8.7(L)  Total Protein 6.5 - 8.1 g/dL 5.8(L)  Total Bilirubin 0.3 - 1.2 mg/dL 0.7  Alkaline Phos 38 - 126 U/L 131(H)  AST 15 - 41 U/L 13(L)  ALT 0 - 44 U/L 14    Discharge instruction: per After Visit Summary and "Baby and Me Booklet".  After visit meds:  Allergies as of 09/03/2019      Reactions   Peanuts [peanut Oil] Anaphylaxis, Hives   Allergic to all nuts   Latex Rash      Medication List    STOP taking these medications   amoxicillin-clavulanate 875-125 MG tablet Commonly known as: AUGMENTIN   aspirin 81 MG chewable tablet   cyclobenzaprine 10 MG tablet Commonly known as: FLEXERIL   labetalol 100 MG tablet Commonly known as: NORMODYNE     TAKE these medications   acetaminophen 500 MG tablet Commonly known as: TYLENOL Take 1,000 mg by mouth every 6 (six) hours as needed for mild pain or headache.   acetaminophen 325 MG tablet Commonly known as: TYLENOL Take 3 tablets (975 mg total) by mouth every 6 (six) hours as needed for headache.   ferrous gluconate 324 MG tablet Commonly known as: FERGON Take 324 mg by mouth daily.   ibuprofen 600 MG tablet Commonly known as: ADVIL Take 1 tablet (600 mg total) by mouth every 6 (six)  hours.   metoCLOPramide 10 MG tablet Commonly known as: REGLAN Take 1 tablet (10 mg total) by mouth every 8 (eight) hours as needed.   prenatal multivitamin Tabs tablet Take 1 tablet by mouth daily at 12 noon.   sertraline 50 MG tablet Commonly known as: Zoloft Take 1 tablet (50 mg total) by mouth daily.       Diet: routine diet  Activity: Advance as tolerated. Pelvic rest for 6 weeks.   Outpatient follow up:BP check w/in 5 days Follow up Appt:No future appointments. Follow up Visit:No follow-ups on file.  Postpartum contraception: Not Discussed  Newborn Data: Live born female  Birth Weight: 5 lb 1.8 oz (2320 g) APGAR: 8, 10  Newborn Delivery   Birth date/time: 09/01/2019 17:50:00 Delivery type: Vaginal, Spontaneous      Baby Feeding: Breast Disposition:NICU   09/03/2019 Thurnell Lose, MD

## 2019-09-04 LAB — SURGICAL PATHOLOGY

## 2020-02-13 ENCOUNTER — Encounter: Payer: Self-pay | Admitting: Allergy and Immunology

## 2020-02-13 ENCOUNTER — Ambulatory Visit (INDEPENDENT_AMBULATORY_CARE_PROVIDER_SITE_OTHER): Payer: 59 | Admitting: Allergy and Immunology

## 2020-02-13 ENCOUNTER — Other Ambulatory Visit: Payer: Self-pay

## 2020-02-13 VITALS — BP 126/84 | HR 80 | Temp 98.2°F | Resp 20 | Ht 69.0 in | Wt 269.0 lb

## 2020-02-13 DIAGNOSIS — T7840XD Allergy, unspecified, subsequent encounter: Secondary | ICD-10-CM | POA: Diagnosis not present

## 2020-02-13 DIAGNOSIS — R197 Diarrhea, unspecified: Secondary | ICD-10-CM | POA: Diagnosis not present

## 2020-02-13 DIAGNOSIS — L500 Allergic urticaria: Secondary | ICD-10-CM | POA: Diagnosis not present

## 2020-02-13 DIAGNOSIS — T7840XA Allergy, unspecified, initial encounter: Secondary | ICD-10-CM | POA: Insufficient documentation

## 2020-02-13 MED ORDER — LEVOCETIRIZINE DIHYDROCHLORIDE 5 MG PO TABS: 5.0000 mg | ORAL_TABLET | Freq: Every day | ORAL | 5 refills | Status: DC | PRN

## 2020-02-13 MED ORDER — FAMOTIDINE 20 MG PO TABS: 20.0000 mg | ORAL_TABLET | Freq: Two times a day (BID) | ORAL | 5 refills | Status: DC | PRN

## 2020-02-13 NOTE — Progress Notes (Signed)
New Patient Note  RE: Chelsea Day MRN: [id number] DOB: 10-07-1995 Date of Office Visit: 02/13/2020  Referring provider: Marda Stalker, PA-C Primary care provider: Marda Stalker, PA-C  Chief Complaint: Food Intolerance and Rash   History of present illness: Chelsea Day is a 24 y.o. female seen today in consultation requested by Marda Stalker, PA-C.  She reports that since the birth of her daughter in January she has experienced a rash which developed on her face, neck, chest, and arms.  The rash is described as nonraised, erythematous, pruritic, burning, and warm to the touch.  The rash typically last for approximately 1 hour and then resolves completely without residual pigmentation or bruising.  Since January, the rash has occurred multiple times per day.  The rash seems to be most frequent/severe at midday and has not been a problem at nighttime.  The rash seems to get worse after consuming bread products.  Otherwise, no specific medication, food, skin care product, detergent, soap, or other environmental triggers have been identified.  She also reports that she has had frequent diarrhea since January and the diarrhea seems to be worse after consuming bread products as well.  Finally, she is being evaluated for severe headaches which initially became a problem during pregnancy and became so severe that they induced her early.  However, the headaches have persisted.  The headache started the occipital area and radiate anteriorly to the right temporal area. 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  She eliminated nuts from her diet for a few years, however gradually began to reintroduce nuts into her diet and currently is able to consume peanuts and tree nuts on a regular basis without symptoms. [name] has no history of symptoms consistent  with allergic rhinoconjunctivitis, asthma, or eczema.  Assessment and plan: Allergic reaction The patients history suggests allergic reaction with an unclear trigger. Food allergen skin tests were negative today despite a positive histamine control. The negative predictive value for skin tests is excellent (greater than 95%). We will proceed with in vitro lab studies to help establish an etiology.  There is no history of symptoms consistent with anaphylaxis.  The following labs have been ordered: FCeRI antibody, anti-thyroglobulin antibody, thyroid peroxidase antibody, baseline serum tryptase, CBC, CMP, ESR, ANA, and serum specific IgE against galactose-alpha-1,3-galactose panel.   Instructions have been discussed and provided for H1/H2 receptor blockade with titration to find lowest effective dose.  A prescription has been provided for levocetirizine (Xyzal), 5 mg daily as needed.  A prescription has been provided for famotidine (Pepcid), 20 mg twice daily as needed.  Should symptoms recur, a journal is to be kept recording any foods eaten, beverages consumed, medications taken within a 6 hour period prior to the onset of symptoms, as well as activities performed, and environmental conditions. For any symptoms concerning for anaphylaxis, 911 is to be called immediately.  Diarrhea  Evaluation plan as above.  If the labs are unrevealing and this problem persists or progresses, gastroenterology consultation is warranted.   Meds ordered this encounter  Medications  . levocetirizine (XYZAL) 5 MG tablet    Sig: Take 1 tablet (5 mg total) by mouth daily as needed.    Dispense:  30 tablet    Refill:  5  . famotidine (PEPCID) 20 MG tablet    Sig: Take 1 tablet (20 mg total) by mouth 2 (two) times daily  as needed.    Dispense:  60 tablet    Refill:  5    Diagnostics: Environmental skin testing: Negative despite a positive histamine control. Food allergen skin testing: Negative despite a  positive histamine control.    Physical examination: Blood pressure 126/84, pulse 80, temperature 98.2 F (36.8 C), temperature source Oral, resp. rate 20, height _0  (1.753 m), weight 268 lb 15.4 oz (122 kg), SpO2 99 %, unknown if currently breastfeeding.  General: Alert, interactive, in no acute distress. HEENT: TMs pearly gray, turbinates minimally edematous without discharge, post-pharynx mildly erythematous. Neck: Supple without lymphadenopathy. Lungs: Clear to auscultation without wheezing, rhonchi or rales. CV: Normal S1, S2 without murmurs. Abdomen: Nondistended, nontender. Skin: Warm and dry, without lesions or rashes. Extremities:  No clubbing, cyanosis or edema. Neuro:   Grossly intact.  Review of systems:  Review of systems negative except as noted in HPI / PMHx or noted below: Review of Systems  Constitutional: Negative.   HENT: Negative.   Eyes: Negative.   Respiratory: Negative.   Cardiovascular: Negative.   Gastrointestinal: Negative.   Genitourinary: Negative.   Musculoskeletal: Negative.   Skin: Negative.   Neurological: Negative.   Endo/Heme/Allergies: Negative.   Psychiatric/Behavioral: Negative.     Past medical history:  Past Medical History:  Diagnosis Date  . Bilateral ovarian cysts   . Endometriosis determined by laparoscopy   . History of postpartum uterine bleeding   . Irregular periods/menstrual cycles   . Pregnancy induced hypertension     Past surgical history:  Past Surgical History:  Procedure Laterality Date  . LAPAROSCOPIC OVARIAN CYSTECTOMY Right 01/24/2015   Procedure: LAPAROSCOPIC LYSIS OF ADHESION, LAPROSCOPIC RIGHT SALPINGOOOPHORECTOMY, BIOPSY OF RIGHT BOARD LIGAMENT; LYSIS BIOPSY OF ADHESIONS, BIOPSY OF LEFT UTERO LIGAMENT;  Surgeon: Ena Dawley, MD;  Location: [facility];  Service: Gynecology;  Laterality: Right;  . LAPAROSCOPY  11/08/2011   Procedure: LAPAROSCOPY OPERATIVE;  Surgeon: Olga Millers, MD;  Location: [facility];   Service: Gynecology;  Laterality: Right;  . OVARIAN CYST REMOVAL  11/08/2011   Procedure: OVARIAN CYSTECTOMY;  Surgeon: Olga Millers, MD;  Location: [facility];  Service: Gynecology;  Laterality: Right;  . UNILATERAL SALPINGECTOMY Right 01/24/15    Family history: Family History  Problem Relation Age of Onset  . Cancer Father        ???  . Multiple sclerosis Mother   . Allergic rhinitis Neg Hx   . Angioedema Neg Hx   . Asthma Neg Hx   . Eczema Neg Hx   . Immunodeficiency Neg Hx   . Urticaria Neg Hx     Social history: Social History   Socioeconomic History  . Marital status: Married    Spouse name: Not on file  . Number of children: Not on file  . Years of education: Not on file  . Highest education level: Not on file  Occupational History  . Not on file  Tobacco Use  . Smoking status: Former Smoker    Types: Cigarettes    Quit date: 11/30/2013    Years since quitting: 6.2  . Smokeless tobacco: Never Used  Vaping Use  . Vaping Use: Never used  Substance and Sexual Activity  . Alcohol use: No    Alcohol/week: 0.0 standard drinks    Comment: social, not while pregnant  . Drug use: No  . Sexual activity: Yes  Other Topics Concern  . Not on file  Social History Narrative  . Not on file   Social  Determinants of Health   Financial Resource Strain:   . Difficulty of Paying Living Expenses:   Food Insecurity:   . Worried About Charity fundraiser in the Last Year:   . Arboriculturist in the Last Year:   Transportation Needs:   . Film/video editor (Medical):   Marland Kitchen Lack of Transportation (Non-Medical):   Physical Activity:   . Days of Exercise per Week:   . Minutes of Exercise per Session:   Stress:   . Feeling of Stress :   Social Connections:   . Frequency of Communication with Friends and Family:   . Frequency of Social Gatherings with Friends and Family:   . Attends Religious Services:   . Active Member of Clubs or Organizations:   . Attends Theatre manager Meetings:   Marland Kitchen Marital Status:   Intimate Partner Violence:   . Fear of Current or Ex-Partner:   . Emotionally Abused:   Marland Kitchen Physically Abused:   . Sexually Abused:     Environmental History: The patient lives in a house built in 1984 with bamboo floors throughout and central air/heat.  There is no known mold/water damage in the home.  There are no pets in the home.  She is a former cigarette smoker having smoked 1/4 pack/day on average between 2012 in 2015.  Current Outpatient Medications  Medication Sig Dispense Refill  . acetaminophen (TYLENOL) 325 MG tablet Take 3 tablets (975 mg total) by mouth every 6 (six) hours as needed for headache. 30 tablet 1  . ibuprofen (ADVIL) 600 MG tablet Take 1 tablet (600 mg total) by mouth every 6 (six) hours. 30 tablet 0  . sertraline (ZOLOFT) 50 MG tablet Take 1 tablet (50 mg total) by mouth daily. 30 tablet 5  . famotidine (PEPCID) 20 MG tablet Take 1 tablet (20 mg total) by mouth 2 (two) times daily as needed. 60 tablet 5  . levocetirizine (XYZAL) 5 MG tablet Take 1 tablet (5 mg total) by mouth daily as needed. 30 tablet 5   No current facility-administered medications for this visit.    Known medication allergies: Allergies  Allergen Reactions  . Peanuts [Peanut Oil] Anaphylaxis and Hives    Allergic to all nuts  . Latex Rash    I appreciate the opportunity to take part in Zunaira's care. Please do not hesitate to contact me with questions.  Sincerely,   R. Edgar Frisk, MD

## 2020-02-13 NOTE — Assessment & Plan Note (Signed)
The patients history suggests allergic reaction with an unclear trigger. Food allergen skin tests were negative today despite a positive histamine control. The negative predictive value for skin tests is excellent (greater than 95%). We will proceed with in vitro lab studies to help establish an etiology.  There is no history of symptoms consistent with anaphylaxis.  The following labs have been ordered: FCeRI antibody, anti-thyroglobulin antibody, thyroid peroxidase antibody, baseline serum tryptase, CBC, CMP, ESR, ANA, and serum specific IgE against galactose-alpha-1,3-galactose panel.   Instructions have been discussed and provided for H1/H2 receptor blockade with titration to find lowest effective dose.  A prescription has been provided for levocetirizine (Xyzal), 5 mg daily as needed.  A prescription has been provided for famotidine (Pepcid), 20 mg twice daily as needed.  Should symptoms recur, a journal is to be kept recording any foods eaten, beverages consumed, medications taken within a 6 hour period prior to the onset of symptoms, as well as activities performed, and environmental conditions. For any symptoms concerning for anaphylaxis, 911 is to be called immediately.

## 2020-02-13 NOTE — Patient Instructions (Addendum)
Allergic reaction The patients history suggests allergic reaction with an unclear trigger. Food allergen skin tests were negative today despite a positive histamine control. The negative predictive value for skin tests is excellent (greater than 95%). We will proceed with in vitro lab studies to help establish an etiology.  There is no history of symptoms consistent with anaphylaxis.  The following labs have been ordered: FCeRI antibody, anti-thyroglobulin antibody, thyroid peroxidase antibody, baseline serum tryptase, CBC, CMP, ESR, ANA, and serum specific IgE against galactose-alpha-1,3-galactose panel.   Instructions have been discussed and provided for H1/H2 receptor blockade with titration to find lowest effective dose.  A prescription has been provided for levocetirizine (Xyzal), 5 mg daily as needed.  A prescription has been provided for famotidine (Pepcid), 20 mg twice daily as needed.  Should symptoms recur, a journal is to be kept recording any foods eaten, beverages consumed, medications taken within a 6 hour period prior to the onset of symptoms, as well as activities performed, and environmental conditions. For any symptoms concerning for anaphylaxis, 911 is to be called immediately.  Diarrhea  Evaluation plan as above.  If the labs are unrevealing and this problem persists or progresses, gastroenterology consultation is warranted.   When lab results have returned you will be called with further recommendations. With the newly implemented Cures Act, the labs may be visible to you at the same time they become visible to [place]. However, the results will typically not be addressed until all of the results are back, so please be patient.  Until you have heard from [place], please continue the treatment plan as outlined on your take home sheet.  Urticaria (Hives)  . Levocetirizine (Xyzal) 5 mg twice a day and famotidine (Pepcid) 20 mg twice a day. If no symptoms for 7-14 days then  decrease to. . Levocetirizine (Xyzal) 5 mg twice a day and famotidine (Pepcid) 20 mg once a day.  If no symptoms for 7-14 days then decrease to. . Levocetirizine (Xyzal) 5 mg twice a day.  If no symptoms for 7-14 days then decrease to. . Levocetirizine (Xyzal) 5 mg once a day.  May use Benadryl (diphenhydramine) as needed for breakthrough symptoms       If symptoms return, then step up dosage

## 2020-02-13 NOTE — Assessment & Plan Note (Signed)
   Evaluation plan as above.  If the labs are unrevealing and this problem persists or progresses, gastroenterology consultation is warranted.

## 2020-02-18 ENCOUNTER — Encounter: Payer: Self-pay | Admitting: Internal Medicine

## 2020-02-21 LAB — COMPREHENSIVE METABOLIC PANEL
ALT: 27 IU/L (ref 0–32)
AST: 17 IU/L (ref 0–40)
Albumin/Globulin Ratio: 1.8 (ref 1.2–2.2)
Albumin: 4.6 g/dL (ref 3.9–5.0)
Alkaline Phosphatase: 101 IU/L (ref 48–121)
BUN/Creatinine Ratio: 13 (ref 9–23)
BUN: 9 mg/dL (ref 6–20)
Bilirubin Total: 0.3 mg/dL (ref 0.0–1.2)
CO2: 21 mmol/L (ref 20–29)
Calcium: 9.4 mg/dL (ref 8.7–10.2)
Chloride: 108 mmol/L — ABNORMAL HIGH (ref 96–106)
Creatinine, Ser: 0.71 mg/dL (ref 0.57–1.00)
GFR calc Af Amer: 136 mL/min/{1.73_m2} (ref >59)
GFR calc non Af Amer: 118 mL/min/{1.73_m2} (ref >59)
Globulin, Total: 2.5 g/dL (ref 1.5–4.5)
Glucose: 92 mg/dL (ref 65–99)
Potassium: 4.5 mmol/L (ref 3.5–5.2)
Sodium: 142 mmol/L (ref 134–144)
Total Protein: 7.1 g/dL (ref 6.0–8.5)

## 2020-02-21 LAB — CBC WITH DIFFERENTIAL/PLATELET
Basophils Absolute: 0.1 10*3/uL (ref 0.0–0.2)
Basos: 2 %
EOS (ABSOLUTE): 0.3 10*3/uL (ref 0.0–0.4)
Eos: 4 %
Hematocrit: 40.8 % (ref 34.0–46.6)
Hemoglobin: 13.1 g/dL (ref 11.1–15.9)
Immature Grans (Abs): 0 10*3/uL (ref 0.0–0.1)
Immature Granulocytes: 0 %
Lymphocytes Absolute: 2.9 10*3/uL (ref 0.7–3.1)
Lymphs: 36 %
MCH: 27.3 pg (ref 26.6–33.0)
MCHC: 32.1 g/dL (ref 31.5–35.7)
MCV: 85 fL (ref 79–97)
Monocytes Absolute: 0.6 10*3/uL (ref 0.1–0.9)
Monocytes: 7 %
Neutrophils Absolute: 4.2 10*3/uL (ref 1.4–7.0)
Neutrophils: 51 %
Platelets: 302 10*3/uL (ref 150–450)
RBC: 4.79 x10E6/uL (ref 3.77–5.28)
RDW: 14.2 % (ref 11.7–15.4)
WBC: 8.1 10*3/uL (ref 3.4–10.8)

## 2020-02-21 LAB — ALPHA-GAL PANEL
Alpha Gal IgE*: <0.1 kU/L (ref <0.10)
Beef (Bos spp) IgE: <0.1 kU/L (ref <0.35)
Class Interpretation: 0
Class Interpretation: 0
Class Interpretation: 0
Lamb/Mutton (Ovis spp) IgE: <0.1 kU/L (ref <0.35)
Pork (Sus spp) IgE: <0.1 kU/L (ref <0.35)

## 2020-02-21 LAB — TRYPTASE: Tryptase: 4.2 ug/L (ref 2.2–13.2)

## 2020-02-21 LAB — ANA W/REFLEX IF POSITIVE: Anti Nuclear Antibody (ANA): NEGATIVE

## 2020-02-21 LAB — CHRONIC URTICARIA: cu index: <6.4 (ref <10)

## 2020-02-21 LAB — SEDIMENTATION RATE: Sed Rate: 7 mm/hr (ref 0–32)

## 2020-02-21 LAB — THYROID PEROXIDASE ANTIBODY: Thyroperoxidase Ab SerPl-aCnc: 10 IU/mL (ref 0–34)

## 2020-02-21 LAB — THYROGLOBULIN ANTIBODY: Thyroglobulin Antibody: <1 IU/mL (ref 0.0–0.9)

## 2020-04-16 ENCOUNTER — Ambulatory Visit: Payer: 59 | Admitting: Internal Medicine

## 2020-05-21 ENCOUNTER — Encounter: Payer: Self-pay | Admitting: Gastroenterology

## 2020-05-21 ENCOUNTER — Ambulatory Visit (INDEPENDENT_AMBULATORY_CARE_PROVIDER_SITE_OTHER): Payer: 59 | Admitting: Gastroenterology

## 2020-05-21 ENCOUNTER — Other Ambulatory Visit: Payer: 59

## 2020-05-21 ENCOUNTER — Other Ambulatory Visit (HOSPITAL_COMMUNITY): Payer: Self-pay | Admitting: Gastroenterology

## 2020-05-21 VITALS — BP 126/80 | HR 80 | Ht 70.0 in | Wt 265.0 lb

## 2020-05-21 DIAGNOSIS — R197 Diarrhea, unspecified: Secondary | ICD-10-CM

## 2020-05-21 DIAGNOSIS — R109 Unspecified abdominal pain: Secondary | ICD-10-CM

## 2020-05-21 DIAGNOSIS — R21 Rash and other nonspecific skin eruption: Secondary | ICD-10-CM | POA: Diagnosis not present

## 2020-05-21 MED ORDER — DICYCLOMINE HCL 10 MG PO CAPS: 10.0000 mg | ORAL_CAPSULE | Freq: Two times a day (BID) | ORAL | 3 refills | Status: DC

## 2020-05-21 NOTE — Patient Instructions (Addendum)
If you are age 26 or older, your body mass index should be between 23-30. Your Body mass index is 38.02 kg/m. If this is out of the aforementioned range listed, please consider follow up with your Primary Care Provider.  If you are age 17 or younger, your body mass index should be between 19-25. Your Body mass index is 38.02 kg/m. If this is out of the aformentioned range listed, please consider follow up with your Primary Care Provider.   Your provider has requested that you go to the basement level for lab work before leaving today. Press "B" on the elevator. The lab is located at the first door on the left as you exit the elevator.  Will call you back with appointment for CT scan.

## 2020-05-21 NOTE — Progress Notes (Signed)
05/21/2020 Chelsea Day [id number] 07-Jun-1996   HISTORY OF PRESENT ILLNESS: This is a pleasant 24 year old female who is new to our office.  She is here at the recommendation of her PCP, Marda Stalker, PA-C, for evaluation regarding a diffuse upper body rash that occurs after eating as well as diarrhea and abdominal cramping.  She says that in January of this year she gave birth to her daughter 8 weeks early because that she is having severe headaches and they thought it was preeclampsia.  She says that her daughter spent 2 weeks in the NICU and during that time she started noticing that every time she would eat she would break out in a rash all over her upper body.  That has been persistent since that time.  She says that anytime she eats anything she breaks out in a rash all over her chest, down her arms, up her neck, etc.  She says it happens every time she eats, but seems worse if she eats gluten or certain types of oils.  Her headaches have continued and she has seen neurology for those.  She reports generalized abdominal cramping all the time.  Has frequent diarrhea after eating although over the past month she feels that she has been more constipated and today was her first bowel movement in 5 days.  She reports some nausea, but nothing significant.  She describes the rash as burning and feeling very hot like it is on fire.  She says it starts within a couple of bites and then resolves about 30 minutes to an hour after eating.  She has seen an allergist and all her for allergy testing is negative.  Things seem to have gotten a little bit better on antihistamines but never completely resolved by any means.  She also saw a dermatologist who really wasn't quite sure what to say about it.  She denies seeing any blood in her stools.  She did show me pictures of the rash and it is quite impressive.  She says that sometimes it is much worse than others.   Past Medical History:  Diagnosis Date   . Bilateral ovarian cysts   . Endometriosis determined by laparoscopy   . History of postpartum uterine bleeding   . Irregular periods/menstrual cycles   . Pregnancy induced hypertension    Past Surgical History:  Procedure Laterality Date  . LAPAROSCOPIC OVARIAN CYSTECTOMY Right 01/24/2015   Procedure: LAPAROSCOPIC LYSIS OF ADHESION, LAPROSCOPIC RIGHT SALPINGOOOPHORECTOMY, BIOPSY OF RIGHT BOARD LIGAMENT; LYSIS BIOPSY OF ADHESIONS, BIOPSY OF LEFT UTERO LIGAMENT;  Surgeon: Ena Dawley, MD;  Location: [facility];  Service: Gynecology;  Laterality: Right;  . LAPAROSCOPY  11/08/2011   Procedure: LAPAROSCOPY OPERATIVE;  Surgeon: Olga Millers, MD;  Location: [facility];  Service: Gynecology;  Laterality: Right;  . OVARIAN CYST REMOVAL  11/08/2011   Procedure: OVARIAN CYSTECTOMY;  Surgeon: Olga Millers, MD;  Location: [facility];  Service: Gynecology;  Laterality: Right;  . UNILATERAL SALPINGECTOMY Right 01/24/15    reports that she quit smoking about 6 years ago. Her smoking use included cigarettes. She has never used smokeless tobacco. She reports that she does not drink alcohol and does not use drugs. family history includes Cancer in her father; Colon cancer in her maternal aunt and maternal grandmother; Multiple sclerosis in her mother. Allergies  Allergen Reactions  . Peanuts [Peanut Oil] Anaphylaxis and Hives    Allergic to all nuts  . Latex Rash  Outpatient Encounter Medications as of 05/21/2020  Medication Sig  . acetaminophen (TYLENOL) 325 MG tablet Take 3 tablets (975 mg total) by mouth every 6 (six) hours as needed for headache.  . ibuprofen (ADVIL) 600 MG tablet Take 1 tablet (600 mg total) by mouth every 6 (six) hours.  . Multiple Vitamin (MULTIVITAMIN) tablet Take 1 tablet by mouth daily.  . sertraline (ZOLOFT) 50 MG tablet Take 1 tablet (50 mg total) by mouth daily.  . [DISCONTINUED] famotidine (PEPCID) 20 MG tablet Take 1 tablet (20 mg total) by mouth 2 (two) times daily as  needed.  . [DISCONTINUED] levocetirizine (XYZAL) 5 MG tablet Take 1 tablet (5 mg total) by mouth daily as needed.   No facility-administered encounter medications on file as of 05/21/2020.    REVIEW OF SYSTEMS  : All other systems reviewed and negative except where noted in the History of Present Illness.   PHYSICAL EXAM: BP 126/80   Pulse 80   Ht 5\' 10"  (1.778 m)   Wt 265 lb (120.2 kg)   BMI 38.02 kg/m  General: Well developed white female in no acute distress Head: Normocephalic and atraumatic Eyes:  Sclerae anicteric, conjunctiva pink. Ears: Normal auditory acuity Lungs: Clear throughout to auscultation; no W/R/R. Heart: Regular rate and rhythm; no M/R/G. Abdomen: Soft, non-distended.  BS present.  Mild diffuse TTP. Musculoskeletal: Symmetrical with no gross deformities  Skin: No lesions on visible extremities Extremities: No edema  Neurological: Alert oriented x 4, grossly non-focal Psychological:  Alert and cooperative. Normal mood and affect  ASSESSMENT AND PLAN: *24 year old female with a constellation of symptoms including constant abdominal cramping, frequent diarrhea although more constipation over the past month, headaches, and a diffuse upper body rash after eating.  All of the symptoms started approximately 2 weeks after giving birth to her daughter in January of this year.  No symptoms prior to this.  She has seen allergist with extensive allergy testing that has been negative.  Some improvement with antihistamines.  Rash occurs every single time that she eats, but may be somewhat worse with gluten and different types of oils.  I am really not quite sure what to make of this.  Discussed with Dr. Ardis Hughs and it is possible that she could have carcinoid syndrome related to a neuroendocrine tumor.  We will you order a Netspot scan to rule this out.  We will also check a 5 HIAA 24-hour urine collection study.  We will check celiac labs.  I am going to give her dicyclomine 10 mg  twice daily to try for the abdominal cramping in the interim.  Prescription sent to the pharmacy.  Suggested trial of MiraLAX daily if she remains on the constipated side.   CC:  Marda Stalker, PA-C

## 2020-05-22 ENCOUNTER — Telehealth: Payer: Self-pay | Admitting: *Deleted

## 2020-05-22 LAB — IGA: Immunoglobulin A: 190 mg/dL (ref 47–310)

## 2020-05-22 LAB — TISSUE TRANSGLUTAMINASE ABS,IGG,IGA
(tTG) Ab, IgA: <1 U/mL
(tTG) Ab, IgG: <1 U/mL

## 2020-05-22 NOTE — Telephone Encounter (Signed)
Informed patient of appointment date and time. Patient voiced understanding.

## 2020-05-22 NOTE — Telephone Encounter (Signed)
Scheduled patient for the [facility] for Monday 06/02/20 @ 12:30 pm. Left message for patient to call office.

## 2020-05-23 ENCOUNTER — Other Ambulatory Visit: Payer: 59

## 2020-05-23 DIAGNOSIS — R197 Diarrhea, unspecified: Secondary | ICD-10-CM

## 2020-05-23 DIAGNOSIS — R109 Unspecified abdominal pain: Secondary | ICD-10-CM

## 2020-05-23 DIAGNOSIS — R21 Rash and other nonspecific skin eruption: Secondary | ICD-10-CM

## 2020-05-23 NOTE — Progress Notes (Signed)
I agree with the above note, plan 

## 2020-05-26 LAB — 5 HIAA, QUANTITATIVE, URINE, 24 HOUR
5 HIAA, 24 Hour Urine: 5.1 mg/24 h (ref <=6.0)
Total Volume: 2200 mL

## 2020-06-02 ENCOUNTER — Ambulatory Visit (HOSPITAL_COMMUNITY): Admission: RE | Admit: 2020-06-02 | Payer: 59 | Source: Ambulatory Visit

## 2020-06-12 ENCOUNTER — Encounter (HOSPITAL_COMMUNITY): Payer: Self-pay

## 2020-06-12 ENCOUNTER — Other Ambulatory Visit: Payer: Self-pay

## 2020-06-12 ENCOUNTER — Ambulatory Visit (HOSPITAL_COMMUNITY)
Admission: RE | Admit: 2020-06-12 | Discharge: 2020-06-12 | Disposition: A | Discharge status: Home / Self Care | Payer: 59 | Source: Ambulatory Visit | Attending: Gastroenterology | Admitting: Gastroenterology

## 2020-06-12 ENCOUNTER — Emergency Department (HOSPITAL_COMMUNITY): Payer: 59

## 2020-06-12 ENCOUNTER — Emergency Department (HOSPITAL_COMMUNITY)
Admission: EM | Admit: 2020-06-12 | Discharge: 2020-06-12 | Disposition: A | Discharge status: Home / Self Care | Payer: 59 | Attending: Emergency Medicine | Admitting: Emergency Medicine

## 2020-06-12 DIAGNOSIS — R0602 Shortness of breath: Secondary | ICD-10-CM | POA: Insufficient documentation

## 2020-06-12 DIAGNOSIS — R21 Rash and other nonspecific skin eruption: Secondary | ICD-10-CM | POA: Insufficient documentation

## 2020-06-12 DIAGNOSIS — Z87891 Personal history of nicotine dependence: Secondary | ICD-10-CM | POA: Insufficient documentation

## 2020-06-12 DIAGNOSIS — T7840XA Allergy, unspecified, initial encounter: Secondary | ICD-10-CM | POA: Insufficient documentation

## 2020-06-12 DIAGNOSIS — R0789 Other chest pain: Secondary | ICD-10-CM | POA: Insufficient documentation

## 2020-06-12 DIAGNOSIS — R197 Diarrhea, unspecified: Secondary | ICD-10-CM

## 2020-06-12 DIAGNOSIS — Z20822 Contact with and (suspected) exposure to covid-19: Secondary | ICD-10-CM | POA: Diagnosis not present

## 2020-06-12 DIAGNOSIS — R109 Unspecified abdominal pain: Secondary | ICD-10-CM | POA: Insufficient documentation

## 2020-06-12 LAB — BASIC METABOLIC PANEL
Anion gap: 11 (ref 5–15)
BUN: 10 mg/dL (ref 6–20)
CO2: 20 mmol/L — ABNORMAL LOW (ref 22–32)
Calcium: 9.3 mg/dL (ref 8.9–10.3)
Chloride: 108 mmol/L (ref 98–111)
Creatinine, Ser: 0.65 mg/dL (ref 0.44–1.00)
GFR, Estimated: >60 mL/min (ref >60)
Glucose, Bld: 103 mg/dL — ABNORMAL HIGH (ref 70–99)
Potassium: 4.6 mmol/L (ref 3.5–5.1)
Sodium: 139 mmol/L (ref 135–145)

## 2020-06-12 LAB — CBC WITH DIFFERENTIAL/PLATELET
Abs Immature Granulocytes: 0.03 10*3/uL (ref 0.00–0.07)
Basophils Absolute: 0.1 10*3/uL (ref 0.0–0.1)
Basophils Relative: 1 %
Eosinophils Absolute: 0.6 10*3/uL — ABNORMAL HIGH (ref 0.0–0.5)
Eosinophils Relative: 4 %
HCT: 43.4 % (ref 36.0–46.0)
Hemoglobin: 14.1 g/dL (ref 12.0–15.0)
Immature Granulocytes: 0 %
Lymphocytes Relative: 42 %
Lymphs Abs: 5.7 10*3/uL — ABNORMAL HIGH (ref 0.7–4.0)
MCH: 28 pg (ref 26.0–34.0)
MCHC: 32.5 g/dL (ref 30.0–36.0)
MCV: 86.1 fL (ref 80.0–100.0)
Monocytes Absolute: 0.9 10*3/uL (ref 0.1–1.0)
Monocytes Relative: 7 %
Neutro Abs: 6.1 10*3/uL (ref 1.7–7.7)
Neutrophils Relative %: 46 %
Platelets: 367 10*3/uL (ref 150–400)
RBC: 5.04 MIL/uL (ref 3.87–5.11)
RDW: 13.4 % (ref 11.5–15.5)
WBC: 13.4 10*3/uL — ABNORMAL HIGH (ref 4.0–10.5)
nRBC: 0 % (ref 0.0–0.2)

## 2020-06-12 LAB — RESPIRATORY PANEL BY RT PCR (FLU A&B, COVID)
Influenza A by PCR: NEGATIVE
Influenza B by PCR: NEGATIVE
SARS Coronavirus 2 by RT PCR: NEGATIVE

## 2020-06-12 LAB — TROPONIN I (HIGH SENSITIVITY)
Troponin I (High Sensitivity): <2 ng/L (ref <18)
Troponin I (High Sensitivity): <2 ng/L (ref <18)

## 2020-06-12 LAB — I-STAT BETA HCG BLOOD, ED (MC, WL, AP ONLY): I-stat hCG, quantitative: <5 m[IU]/mL (ref <5)

## 2020-06-12 MED ORDER — PREDNISONE 20 MG PO TABS: 40.0000 mg | ORAL_TABLET | Freq: Every day | ORAL | 0 refills | Status: DC

## 2020-06-12 MED ORDER — ALUM & MAG HYDROXIDE-SIMETH 200-200-20 MG/5ML PO SUSP
30.0000 mL | Freq: Once | ORAL | Status: AC
  Administered 2020-06-12: 30 mL via ORAL
  Filled 2020-06-12: qty 30

## 2020-06-12 MED ORDER — METHYLPREDNISOLONE SODIUM SUCC 125 MG IJ SOLR
125.0000 mg | Freq: Once | INTRAMUSCULAR | Status: AC
  Administered 2020-06-12: 125 mg via INTRAVENOUS
  Filled 2020-06-12: qty 2

## 2020-06-12 MED ORDER — FAMOTIDINE IN NACL 20-0.9 MG/50ML-% IV SOLN
20.0000 mg | Freq: Once | INTRAVENOUS | Status: AC
  Administered 2020-06-12: 20 mg via INTRAVENOUS
  Filled 2020-06-12: qty 50

## 2020-06-12 MED ORDER — FENTANYL CITRATE (PF) 100 MCG/2ML IJ SOLN
50.0000 ug | Freq: Once | INTRAMUSCULAR | Status: AC
  Administered 2020-06-12: 50 ug via INTRAVENOUS
  Filled 2020-06-12: qty 2

## 2020-06-12 MED ORDER — LORAZEPAM 1 MG PO TABS
1.0000 mg | ORAL_TABLET | Freq: Once | ORAL | Status: AC
  Administered 2020-06-12: 1 mg via ORAL
  Filled 2020-06-12: qty 1

## 2020-06-12 MED ORDER — SODIUM CHLORIDE 0.9 % IV BOLUS
1000.0000 mL | Freq: Once | INTRAVENOUS | Status: AC
  Administered 2020-06-12: 1000 mL via INTRAVENOUS

## 2020-06-12 MED ORDER — DIPHENHYDRAMINE HCL 50 MG/ML IJ SOLN
25.0000 mg | Freq: Once | INTRAMUSCULAR | Status: AC
  Administered 2020-06-12: 25 mg via INTRAVENOUS
  Filled 2020-06-12: qty 1

## 2020-06-12 MED ORDER — FENTANYL CITRATE (PF) 100 MCG/2ML IJ SOLN
50.0000 ug | Freq: Once | INTRAMUSCULAR | Status: DC
  Filled 2020-06-12: qty 2

## 2020-06-12 MED ORDER — LIDOCAINE VISCOUS HCL 2 % MT SOLN
15.0000 mL | Freq: Once | OROMUCOSAL | Status: AC
  Administered 2020-06-12: 15 mL via ORAL
  Filled 2020-06-12: qty 15

## 2020-06-12 NOTE — ED Provider Notes (Signed)
Assumed care of patient from Dr. Roslynn Amble at 3:30 PM.  Patient arrived with complaint of severe chest pain tight squeezing sensation after receiving dye when she was going to be getting a PET scan today.  She reports she has never had that IV medication before.  She was getting a PET scan due to concern for possible malignancy but no known issues.  Patient reported that she was short of breath, flushing and having the chest pressure and sensation.  Upon arrival here patient's oxygen was 100%, blood pressure was normal and pulse was within normal limits.  She was mildly tachypneic but she denies any sensation of swelling in her throat or on her tongue.  She did receive medications initially including steroids, Benadryl and pain medication.  She reports now the pain is mostly gone but she still feels a tightness in her chest.  She continues to sat normally on room air with normal respiratory rate.  She has no wheezing and is well-appearing.  She does have some epigastric discomfort and concerned that some of this may be reflux related as she reports she has been having ongoing stomach issues.  Second troponin is pending but all of her labs have been reassuring except a mild leukocytosis of 13,000 which is improved from prior.  Chest x-ray is within normal limits and EKG without acute findings.  Will give GI cocktail and reevaluate.  5:56 PM No improvement after gi cocktail.    Delta trop wnl.  Will ambulate pt and ensure stable for d/c.  Patient reports with ambulating her chest tightness does get a little bit worse but she was able to ambulate without any desaturation.  She continues to have no wheezing.  Most likely a reaction to the contrast she received.  She otherwise appears well.  No symptoms to suggest carcinoid crisis.  Given an additional dose of prednisone to take tomorrow if she is still having symptoms and strict return precautions.   Blanchie Dessert, MD 06/12/20 1845

## 2020-06-12 NOTE — ED Triage Notes (Signed)
Pt was at [facility] getting a CT scan when she began to have an allergic reaction to the medication they gave via IV. Pt started to get flushed, SOB and having left sided chest tightness.

## 2020-06-12 NOTE — ED Notes (Signed)
Patient ambulated to the restroom without assistance 

## 2020-06-12 NOTE — ED Provider Notes (Signed)
[facility] DEPT Provider Note   CSN: [id number] Arrival date & time: 06/12/20  1413     History Chief Complaint  Patient presents with  . Allergic Reaction    Chelsea Day is a 24 y.o. female.  Presents to ER after concern for possible allergic reaction while receiving a PET scan.  Patient states she was getting a PET scan due to concern for possibility of malignancy however she has no known malignancy.  Reports that she was in her normal state of health today, immediately after she received the contrast she felt a flushed sensation, short of breath and had chest pressure.  She has continued to have these feelings, no nausea or vomiting, abdominal pain.  No rash.  She denies prior history of allergies or anaphylaxis.  Pain is described as pressure, central, nonradiating.  Constant.  Severe.  HPI     Past Medical History:  Diagnosis Date  . Bilateral ovarian cysts   . Endometriosis determined by laparoscopy   . History of postpartum uterine bleeding   . Irregular periods/menstrual cycles   . Pregnancy induced hypertension     Patient Active Problem List   Diagnosis Date Noted  . Abdominal pain 05/21/2020  . Rash 05/21/2020  . Allergic reaction 02/13/2020  . Diarrhea 02/13/2020  . [redacted] weeks gestation of pregnancy   . RUQ pain   . Preeclampsia, severe, third trimester 08/27/2019  . Postpartum hemorrhage, third stage, postpartum condition 08/13/2016  . Vaginal delivery 08/13/2016  . Hypertension in pregnancy, preeclampsia, severe, third trimester 08/11/2016  . Group beta Strep positive 08/11/2016  . History of right salpingo-oophorectomy 08/11/2016  . Need for vaccination for rubella 08/11/2016    Past Surgical History:  Procedure Laterality Date  . LAPAROSCOPIC OVARIAN CYSTECTOMY Right 01/24/2015   Procedure: LAPAROSCOPIC LYSIS OF ADHESION, LAPROSCOPIC RIGHT SALPINGOOOPHORECTOMY, BIOPSY OF RIGHT BOARD LIGAMENT; LYSIS BIOPSY OF ADHESIONS,  BIOPSY OF LEFT UTERO LIGAMENT;  Surgeon: Ena Dawley, MD;  Location: [facility];  Service: Gynecology;  Laterality: Right;  . LAPAROSCOPY  11/08/2011   Procedure: LAPAROSCOPY OPERATIVE;  Surgeon: Olga Millers, MD;  Location: [facility];  Service: Gynecology;  Laterality: Right;  . OVARIAN CYST REMOVAL  11/08/2011   Procedure: OVARIAN CYSTECTOMY;  Surgeon: Olga Millers, MD;  Location: [facility];  Service: Gynecology;  Laterality: Right;  . UNILATERAL SALPINGECTOMY Right 01/24/15     OB History    Gravida  3   Para  2   Term  1   Preterm  1   AB  1   Living  2     SAB  1   TAB      Ectopic      Multiple  0   Live Births  2           Family History  Problem Relation Age of Onset  . Cancer Father        ???  . Multiple sclerosis Mother   . Colon cancer Maternal Grandmother   . Colon cancer Maternal Aunt   . Allergic rhinitis Neg Hx   . Angioedema Neg Hx   . Asthma Neg Hx   . Eczema Neg Hx   . Immunodeficiency Neg Hx   . Urticaria Neg Hx     Social History   Tobacco Use  . Smoking status: Former Smoker    Types: Cigarettes    Quit date: 11/30/2013    Years since quitting: 6.5  . Smokeless tobacco: Never Used  Vaping Use  . Vaping Use: Never used  Substance Use Topics  . Alcohol use: No    Alcohol/week: 0.0 standard drinks    Comment: social, not while pregnant  . Drug use: No    Home Medications Prior to Admission medications   Medication Sig Start Date End Date Taking? Authorizing Provider  ibuprofen (ADVIL) 600 MG tablet Take 1 tablet (600 mg total) by mouth every 6 (six) hours. Patient taking differently: Take 600 mg by mouth every 6 (six) hours as needed for fever, headache or mild pain.  09/03/19  Yes Thurnell Lose, MD  dicyclomine (BENTYL) 10 MG capsule Take 1 capsule (10 mg total) by mouth 2 (two) times daily. Patient not taking: Reported on 06/12/2020 05/21/20   Zehr, Laban Emperor, PA-C  sertraline (ZOLOFT) 50 MG tablet Take 1 tablet (50 mg total)  by mouth daily. Patient not taking: Reported on 06/12/2020 09/03/19   Thurnell Lose, MD    Allergies    Patient has no known allergies.  Review of Systems   Review of Systems  Constitutional: Negative for chills and fever.  HENT: Negative for ear pain and sore throat.   Eyes: Negative for pain and visual disturbance.  Respiratory: Negative for cough and shortness of breath.   Cardiovascular: Positive for chest pain and palpitations.  Gastrointestinal: Negative for abdominal pain and vomiting.  Genitourinary: Negative for dysuria and hematuria.  Musculoskeletal: Negative for arthralgias and back pain.  Skin: Negative for color change and rash.  Neurological: Negative for seizures and syncope.  All other systems reviewed and are negative.   Physical Exam Updated Vital Signs BP 114/75   Pulse 96   Temp 98.1 F (36.7 C) (Oral)   Resp (!) 21   Ht 5\' 10"  (1.778 m)   Wt 119.3 kg   SpO2 99%   BMI 37.74 kg/m   Physical Exam Vitals and nursing note reviewed.  Constitutional:      General: She is not in acute distress.    Appearance: She is well-developed.  HENT:     Head: Normocephalic and atraumatic.  Eyes:     Conjunctiva/sclera: Conjunctivae normal.  Cardiovascular:     Rate and Rhythm: Normal rate and regular rhythm.     Heart sounds: No murmur heard.   Pulmonary:     Effort: Pulmonary effort is normal. No respiratory distress.     Breath sounds: Normal breath sounds.  Abdominal:     Palpations: Abdomen is soft.     Tenderness: There is no abdominal tenderness.  Musculoskeletal:     Cervical back: Neck supple.  Skin:    General: Skin is warm and dry.     Capillary Refill: Capillary refill takes less than 2 seconds.     Comments: No rash  Neurological:     General: No focal deficit present.     Mental Status: She is alert and oriented to person, place, and time.  Psychiatric:        Mood and Affect: Mood normal.     ED Results / Procedures / Treatments     Labs (all labs ordered are listed, but only abnormal results are displayed) Labs Reviewed  CBC WITH DIFFERENTIAL/PLATELET - Abnormal; Notable for the following components:      Result Value   WBC 13.4 (*)    Lymphs Abs 5.7 (*)    Eosinophils Absolute 0.6 (*)    All other components within normal limits  RESPIRATORY PANEL BY RT PCR (FLU A&B, COVID)  BASIC METABOLIC PANEL  I-STAT BETA HCG BLOOD, ED ([facility], [facility], [facility] ONLY)  TROPONIN I (HIGH SENSITIVITY)    EKG EKG Interpretation  Date/Time:  Thursday June 12 2020 14:20:58 EST Ventricular Rate:  95 PR Interval:    QRS Duration: 88 QT Interval:  371 QTC Calculation: 467 R Axis:   82 Text Interpretation: Sinus rhythm Confirmed by Madalyn Rob [phone]) on 06/12/2020 3:16:49 PM   Radiology No results found.  Procedures Procedures (including critical care time)  Medications Ordered in ED Medications  sodium chloride 0.9 % bolus 1,000 mL (has no administration in time range)  diphenhydrAMINE (BENADRYL) injection 25 mg (25 mg Intravenous Given 06/12/20 1447)  methylPREDNISolone sodium succinate (SOLU-MEDROL) 125 mg/2 mL injection 125 mg (125 mg Intravenous Given 06/12/20 1449)  famotidine (PEPCID) IVPB 20 mg premix (20 mg Intravenous New Bag/Given 06/12/20 1452)  fentaNYL (SUBLIMAZE) injection 50 mcg (50 mcg Intravenous Given 06/12/20 1522)  LORazepam (ATIVAN) tablet 1 mg (1 mg Oral Given 06/12/20 1521)    ED Course  I have reviewed the triage vital signs and the nursing notes.  Pertinent labs & imaging results that were available during my care of the patient were reviewed by me and considered in my medical decision making (see chart for details).    MDM Rules/Calculators/A&P                         24 year old girl presents to ER after concern for possible allergic reaction after receiving IV contrast for PET scan.  In ER patient noted to have stable vital signs, no wheezing on exam, no rash, no swelling in oropharynx.   No definite symptoms/signs of anaphylaxis but concern for possible allergic reaction, gave steroids, Benadryl, Pepcid.  Due to sensation of chest pressure, check labs, EKG, CXR.  While awaiting labs, further reassessment and observation, patient signed out to Dr. Maryan Rued.  Refer to her note for final plan and disposition.  Final Clinical Impression(s) / ED Diagnoses Final diagnoses:  Allergic reaction, initial encounter    Rx / DC Orders ED Discharge Orders    None       Lucrezia Starch, MD 06/12/20 1531

## 2020-06-12 NOTE — Discharge Instructions (Signed)
If you start having worsening pressure, inability to breathe, sensation of throat swelling return to the emergency room immediately.  If you are still having some symptoms tomorrow take the additional dose of steroid.

## 2020-06-13 ENCOUNTER — Telehealth: Payer: Self-pay | Admitting: Gastroenterology

## 2020-06-13 NOTE — Telephone Encounter (Signed)
We can plan to premedicate her prior to the study and reschedule it as you suggested if she is willing.

## 2020-06-13 NOTE — Telephone Encounter (Signed)
That is unfortunate.  I am not sure where to go next.  I will need to address this with Dr. Ardis Hughs but I am covering at the hospital right now.  Her celiac labs were negative and her urine study was normal.

## 2020-06-16 ENCOUNTER — Telehealth: Payer: Self-pay | Admitting: Gastroenterology

## 2020-06-16 NOTE — Telephone Encounter (Signed)
Pt is requesting a call back from a nurse, pt would like to inform the nurse that she had an allergic reaction to the medication for the PET scan so she was unable to complete it, pt states her symptoms have worsened and would like to be seen sometime soon, no appts available.

## 2020-06-17 NOTE — Telephone Encounter (Signed)
Chelsea Conn do you have a message for this patient to get her rescheduled for PET with premeds?

## 2020-06-17 NOTE — Telephone Encounter (Signed)
Yes. I did see Chelsea Day message yesterday but I wan unable to get this taken care of between my patients yesterday. I will try to call the patient today and get this taken care.

## 2020-06-17 NOTE — Telephone Encounter (Signed)
Ok thank you 

## 2020-06-17 NOTE — Telephone Encounter (Signed)
Left message for patient to call office.  

## 2020-06-17 NOTE — Telephone Encounter (Signed)
Spoke with [facility] and they are going to review patient chart and come up with a plan to try and get patient in for another scan. Patient informed.

## 2020-06-18 ENCOUNTER — Other Ambulatory Visit (HOSPITAL_COMMUNITY): Payer: Self-pay | Admitting: Gastroenterology

## 2020-06-18 DIAGNOSIS — R197 Diarrhea, unspecified: Secondary | ICD-10-CM

## 2020-06-18 DIAGNOSIS — R1084 Generalized abdominal pain: Secondary | ICD-10-CM

## 2020-06-18 DIAGNOSIS — R21 Rash and other nonspecific skin eruption: Secondary | ICD-10-CM

## 2020-06-18 NOTE — Telephone Encounter (Signed)
Great. Thank you.

## 2020-06-18 NOTE — Telephone Encounter (Signed)
Sounds good to me!  Do we need to give her a dose of xanax prior to as well so she doesn't have an anxiety attack again?

## 2020-06-18 NOTE — Telephone Encounter (Signed)
Spoke with [facility] and they said we could try and do a netspot for the patient. It will look for the same things but it uses a different isotope. Is this okay? Please advise.

## 2020-06-18 NOTE — Telephone Encounter (Signed)
Chelsea Day at [facility] scheduled appointment with patient. I called patient and asked if she felt like she needed anything for her anxiety and she states she should be fine. I told her if she changes her mind closer to the scan date to let us know.

## 2020-06-30 ENCOUNTER — Other Ambulatory Visit: Payer: Self-pay

## 2020-06-30 ENCOUNTER — Ambulatory Visit (HOSPITAL_COMMUNITY)
Admission: RE | Admit: 2020-06-30 | Discharge: 2020-06-30 | Disposition: A | Discharge status: Home / Self Care | Payer: 59 | Source: Ambulatory Visit | Attending: Gastroenterology | Admitting: Gastroenterology

## 2020-06-30 DIAGNOSIS — R197 Diarrhea, unspecified: Secondary | ICD-10-CM | POA: Diagnosis not present

## 2020-06-30 DIAGNOSIS — R1084 Generalized abdominal pain: Secondary | ICD-10-CM | POA: Insufficient documentation

## 2020-06-30 DIAGNOSIS — R21 Rash and other nonspecific skin eruption: Secondary | ICD-10-CM | POA: Insufficient documentation

## 2020-06-30 MED ORDER — GALLIUM GA 68 DOTATATE IV KIT
5.3000 | PACK | Freq: Once | INTRAVENOUS | Status: AC | PRN
  Administered 2020-06-30: 5.3 via INTRAVENOUS

## 2020-08-27 ENCOUNTER — Ambulatory Visit: Payer: 59 | Admitting: Gastroenterology

## 2020-10-24 ENCOUNTER — Encounter (HOSPITAL_BASED_OUTPATIENT_CLINIC_OR_DEPARTMENT_OTHER): Payer: Self-pay | Admitting: Emergency Medicine

## 2020-10-24 ENCOUNTER — Emergency Department (HOSPITAL_BASED_OUTPATIENT_CLINIC_OR_DEPARTMENT_OTHER)
Admission: EM | Admit: 2020-10-24 | Discharge: 2020-10-25 | Disposition: A | Discharge status: Home / Self Care | Payer: 59 | Attending: Emergency Medicine | Admitting: Emergency Medicine

## 2020-10-24 ENCOUNTER — Other Ambulatory Visit: Payer: Self-pay

## 2020-10-24 DIAGNOSIS — R509 Fever, unspecified: Secondary | ICD-10-CM | POA: Diagnosis not present

## 2020-10-24 DIAGNOSIS — R1031 Right lower quadrant pain: Secondary | ICD-10-CM | POA: Insufficient documentation

## 2020-10-24 DIAGNOSIS — R110 Nausea: Secondary | ICD-10-CM | POA: Insufficient documentation

## 2020-10-24 DIAGNOSIS — R109 Unspecified abdominal pain: Secondary | ICD-10-CM

## 2020-10-24 DIAGNOSIS — Z87891 Personal history of nicotine dependence: Secondary | ICD-10-CM | POA: Insufficient documentation

## 2020-10-24 LAB — CBC
HCT: 38.5 % (ref 36.0–46.0)
Hemoglobin: 13.1 g/dL (ref 12.0–15.0)
MCH: 28.2 pg (ref 26.0–34.0)
MCHC: 34 g/dL (ref 30.0–36.0)
MCV: 82.8 fL (ref 80.0–100.0)
Platelets: 306 10*3/uL (ref 150–400)
RBC: 4.65 MIL/uL (ref 3.87–5.11)
RDW: 13.2 % (ref 11.5–15.5)
WBC: 10.6 10*3/uL — ABNORMAL HIGH (ref 4.0–10.5)
nRBC: 0 % (ref 0.0–0.2)

## 2020-10-24 LAB — LIPASE, BLOOD: Lipase: 33 U/L (ref 11–51)

## 2020-10-24 LAB — COMPREHENSIVE METABOLIC PANEL
ALT: 33 U/L (ref 0–44)
AST: 26 U/L (ref 15–41)
Albumin: 4.1 g/dL (ref 3.5–5.0)
Alkaline Phosphatase: 67 U/L (ref 38–126)
Anion gap: 9 (ref 5–15)
BUN: 6 mg/dL (ref 6–20)
CO2: 21 mmol/L — ABNORMAL LOW (ref 22–32)
Calcium: 8.7 mg/dL — ABNORMAL LOW (ref 8.9–10.3)
Chloride: 107 mmol/L (ref 98–111)
Creatinine, Ser: 0.73 mg/dL (ref 0.44–1.00)
GFR, Estimated: >60 mL/min (ref >60)
Glucose, Bld: 109 mg/dL — ABNORMAL HIGH (ref 70–99)
Potassium: 3.2 mmol/L — ABNORMAL LOW (ref 3.5–5.1)
Sodium: 137 mmol/L (ref 135–145)
Total Bilirubin: 0.2 mg/dL — ABNORMAL LOW (ref 0.3–1.2)
Total Protein: 7.6 g/dL (ref 6.5–8.1)

## 2020-10-24 MED ORDER — ONDANSETRON HCL 4 MG/2ML IJ SOLN
4.0000 mg | Freq: Once | INTRAMUSCULAR | Status: AC | PRN
  Administered 2020-10-24: 4 mg via INTRAVENOUS
  Filled 2020-10-24: qty 2

## 2020-10-24 MED ORDER — SODIUM CHLORIDE 0.9 % IV BOLUS
1000.0000 mL | Freq: Once | INTRAVENOUS | Status: AC
  Administered 2020-10-24: 1000 mL via INTRAVENOUS

## 2020-10-24 NOTE — ED Provider Notes (Incomplete)
[place] [facility] Provider Note: Georgena Spurling, MD, FACEP  CSN: [id number] MRN: [id number] ARRIVAL: 10/24/20 at 2129 ROOM: MH06/[facility]06   CHIEF COMPLAINT  Abdominal Pain, Fever, and Diarrhea   HISTORY OF PRESENT ILLNESS  10/24/20 11:55 PM Chelsea Day is a 25 y.o. female with diarrhea for the past 4 days.  Yesterday she started having abdominal cramping.  The cramping is diffuse but most prominent in the right lower quadrant and in the right flank.  She rates it as a 6 or a 7 at its worst but it is intermittent.  Nothing seems to make it better or worse.  She has had   Past Medical History:  Diagnosis Date  . Bilateral ovarian cysts   . Endometriosis determined by laparoscopy   . History of postpartum uterine bleeding   . Irregular periods/menstrual cycles   . Pregnancy induced hypertension     Past Surgical History:  Procedure Laterality Date  . LAPAROSCOPIC OVARIAN CYSTECTOMY Right 01/24/2015   Procedure: LAPAROSCOPIC LYSIS OF ADHESION, LAPROSCOPIC RIGHT SALPINGOOOPHORECTOMY, BIOPSY OF RIGHT BOARD LIGAMENT; LYSIS BIOPSY OF ADHESIONS, BIOPSY OF LEFT UTERO LIGAMENT;  Surgeon: Ena Dawley, MD;  Location: [facility];  Service: Gynecology;  Laterality: Right;  . LAPAROSCOPY  11/08/2011   Procedure: LAPAROSCOPY OPERATIVE;  Surgeon: Olga Millers, MD;  Location: [facility];  Service: Gynecology;  Laterality: Right;  . OVARIAN CYST REMOVAL  11/08/2011   Procedure: OVARIAN CYSTECTOMY;  Surgeon: Olga Millers, MD;  Location: [facility];  Service: Gynecology;  Laterality: Right;  . UNILATERAL SALPINGECTOMY Right 01/24/15    Family History  Problem Relation Age of Onset  . Cancer Father        ???  . Multiple sclerosis Mother   . Colon cancer Maternal Grandmother   . Colon cancer Maternal Aunt   . Allergic rhinitis Neg Hx   . Angioedema Neg Hx   . Asthma Neg Hx   . Eczema Neg Hx   . Immunodeficiency Neg Hx   . Urticaria Neg Hx     Social History   Tobacco Use  . Smoking  status: Former Smoker    Types: Cigarettes    Quit date: 11/30/2013    Years since quitting: 6.9  . Smokeless tobacco: Never Used  Vaping Use  . Vaping Use: Never used  Substance Use Topics  . Alcohol use: No    Alcohol/week: 0.0 standard drinks    Comment: social, not while pregnant  . Drug use: No    Prior to Admission medications   Medication Sig Start Date End Date Taking? Authorizing Provider  dicyclomine (BENTYL) 10 MG capsule Take 1 capsule (10 mg total) by mouth 2 (two) times daily. Patient not taking: Reported on 06/12/2020 05/21/20   Zehr, Laban Emperor, PA-C  ibuprofen (ADVIL) 600 MG tablet Take 1 tablet (600 mg total) by mouth every 6 (six) hours. Patient taking differently: Take 600 mg by mouth every 6 (six) hours as needed for fever, headache or mild pain.  09/03/19   Thurnell Lose, MD  predniSONE (DELTASONE) 20 MG tablet Take 2 tablets (40 mg total) by mouth daily. Take tomorrow if still having symptoms 06/12/20   Blanchie Dessert, MD  sertraline (ZOLOFT) 50 MG tablet Take 1 tablet (50 mg total) by mouth daily. Patient not taking: Reported on 06/12/2020 09/03/19   Thurnell Lose, MD    Allergies Contrast media [iodinated diagnostic agents]   REVIEW OF SYSTEMS  Negative except as noted here or in the History of Present  Illness.   PHYSICAL EXAMINATION  Initial Vital Signs Blood pressure 114/62, pulse 98, temperature 99.6 F (37.6 C), temperature source Oral, resp. rate 18, height 5\' 10"  (1.778 m), weight 117.9 kg, SpO2 97 %, currently breastfeeding.  Examination General: Well-developed, well-nourished female in no acute distress; appearance consistent with age of record HENT: normocephalic; atraumatic Eyes: pupils equal, round and reactive to light; extraocular muscles intact Neck: supple Heart: regular rate and rhythm; no murmurs, rubs or gallops Lungs: clear to auscultation bilaterally Abdomen: soft; nondistended; nontender; no masses or hepatosplenomegaly; bowel  sounds present Extremities: No deformity; full range of motion; pulses normal Neurologic: Awake, alert and oriented; motor function intact in all extremities and symmetric; no facial droop Skin: Warm and dry Psychiatric: Normal mood and affect   RESULTS  Summary of this visit's results, reviewed and interpreted by myself:   EKG Interpretation  Date/Time:    Ventricular Rate:    PR Interval:    QRS Duration:   QT Interval:    QTC Calculation:   R Axis:     Text Interpretation:        Laboratory Studies: Results for orders placed or performed during the hospital encounter of 10/24/20 (from the past 24 hour(s))  Lipase, blood     Status: None   Collection Time: 10/24/20 11:01 PM  Result Value Ref Range   Lipase 33 11 - 51 U/L  Comprehensive metabolic panel     Status: Abnormal   Collection Time: 10/24/20 11:01 PM  Result Value Ref Range   Sodium 137 135 - 145 mmol/L   Potassium 3.2 (L) 3.5 - 5.1 mmol/L   Chloride 107 98 - 111 mmol/L   CO2 21 (L) 22 - 32 mmol/L   Glucose, Bld 109 (H) 70 - 99 mg/dL   BUN 6 6 - 20 mg/dL   Creatinine, Ser 0.73 0.44 - 1.00 mg/dL   Calcium 8.7 (L) 8.9 - 10.3 mg/dL   Total Protein 7.6 6.5 - 8.1 g/dL   Albumin 4.1 3.5 - 5.0 g/dL   AST 26 15 - 41 U/L   ALT 33 0 - 44 U/L   Alkaline Phosphatase 67 38 - 126 U/L   Total Bilirubin 0.2 (L) 0.3 - 1.2 mg/dL   GFR, Estimated >60 >60 mL/min   Anion gap 9 5 - 15  CBC     Status: Abnormal   Collection Time: 10/24/20 11:01 PM  Result Value Ref Range   WBC 10.6 (H) 4.0 - 10.5 K/uL   RBC 4.65 3.87 - 5.11 MIL/uL   Hemoglobin 13.1 12.0 - 15.0 g/dL   HCT 38.5 36.0 - 46.0 %   MCV 82.8 80.0 - 100.0 fL   MCH 28.2 26.0 - 34.0 pg   MCHC 34.0 30.0 - 36.0 g/dL   RDW 13.2 11.5 - 15.5 %   Platelets 306 150 - 400 K/uL   nRBC 0.0 0.0 - 0.2 %   Imaging Studies: No results found.  ED COURSE and MDM  Nursing notes, initial and subsequent vitals signs, including pulse oximetry, reviewed and interpreted by  myself.  Vitals:   10/24/20 2230 10/24/20 2256 10/24/20 2300 10/24/20 2330  BP: (!) 121/95 (!) 121/95 127/63 114/62  Pulse: (!) 128 (!) 109 (!) 113 98  Resp:  (!) 22 16 18   Temp:      TempSrc:      SpO2: 99% 100% 97% 97%  Weight:      Height:       Medications  ondansetron (  ZOFRAN) injection 4 mg (4 mg Intravenous Given 10/24/20 2311)  sodium chloride 0.9 % bolus 1,000 mL (1,000 mLs Intravenous New Bag/Given 10/24/20 2310)      PROCEDURES  Procedures   ED DIAGNOSES  No diagnosis found.

## 2020-10-24 NOTE — ED Triage Notes (Signed)
diarrhea since Monday and pain starting yesterday in lower back radiating  to abdomen. Notice blood in stool at times.

## 2020-10-24 NOTE — ED Provider Notes (Signed)
[place] [facility] Provider Note: Georgena Spurling, MD, FACEP  CSN: [id number] MRN: [id number] ARRIVAL: 10/24/20 at 2129 ROOM: [place]  Abdominal Pain   HISTORY OF PRESENT ILLNESS  10/24/20 11:55 PM Chelsea Day is a 25 y.o. female with diarrhea for the past 4 days.  Yesterday she started having abdominal cramping.  The cramping is diffuse but most prominent in the right lower quadrant and in the right flank.  She rates it as a 6 or a 7 at its worst but it is intermittent.  Nothing seems to make it better or worse.  She has had nausea but no vomiting.  She is noted blood in her stool, primarily just on the toilet tissue.  Today she began having a fever and her temperature was 99.6 on arrival.  She was given Zofran IV along with an IV fluid bolus prior to my evaluation with some improvement in her symptoms.   Past Medical History:  Diagnosis Date  . Bilateral ovarian cysts   . Endometriosis determined by laparoscopy   . History of postpartum uterine bleeding   . Irregular periods/menstrual cycles   . Pregnancy induced hypertension     Past Surgical History:  Procedure Laterality Date  . LAPAROSCOPIC OVARIAN CYSTECTOMY Right 01/24/2015   Procedure: LAPAROSCOPIC LYSIS OF ADHESION, LAPROSCOPIC RIGHT SALPINGOOOPHORECTOMY, BIOPSY OF RIGHT BOARD LIGAMENT; LYSIS BIOPSY OF ADHESIONS, BIOPSY OF LEFT UTERO LIGAMENT;  Surgeon: Ena Dawley, MD;  Location: [facility];  Service: Gynecology;  Laterality: Right;  . LAPAROSCOPY  11/08/2011   Procedure: LAPAROSCOPY OPERATIVE;  Surgeon: Olga Millers, MD;  Location: [facility];  Service: Gynecology;  Laterality: Right;  . OVARIAN CYST REMOVAL  11/08/2011   Procedure: OVARIAN CYSTECTOMY;  Surgeon: Olga Millers, MD;  Location: [facility];  Service: Gynecology;  Laterality: Right;  . UNILATERAL SALPINGECTOMY Right 01/24/15    Family History  Problem Relation Age of Onset  . Cancer Father        ???  . Multiple sclerosis Mother    . Colon cancer Maternal Grandmother   . Colon cancer Maternal Aunt   . Allergic rhinitis Neg Hx   . Angioedema Neg Hx   . Asthma Neg Hx   . Eczema Neg Hx   . Immunodeficiency Neg Hx   . Urticaria Neg Hx     Social History   Tobacco Use  . Smoking status: Former Smoker    Types: Cigarettes    Quit date: 11/30/2013    Years since quitting: 6.9  . Smokeless tobacco: Never Used  Vaping Use  . Vaping Use: Never used  Substance Use Topics  . Alcohol use: No    Alcohol/week: 0.0 standard drinks    Comment: social, not while pregnant  . Drug use: No    Prior to Admission medications   Medication Sig Start Date End Date Taking? Authorizing Provider  dicyclomine (BENTYL) 20 MG tablet Take 1 tablet (20 mg total) by mouth 4 (four) times daily as needed (abdominal cramping). 10/25/20  Yes Titianna Loomis, MD  ibuprofen (ADVIL) 600 MG tablet Take 1 tablet (600 mg total) by mouth every 6 (six) hours. Patient taking differently: Take 600 mg by mouth every 6 (six) hours as needed for fever, headache or mild pain.  09/03/19   Thurnell Lose, MD  sertraline (ZOLOFT) 50 MG tablet Take 1 tablet (50 mg total) by mouth daily. Patient not taking: Reported on 06/12/2020 09/03/19 10/25/20  Thurnell Lose, MD    Allergies Contrast  media [iodinated diagnostic agents]   REVIEW OF SYSTEMS  Negative except as noted here or in the History of Present Illness.   PHYSICAL EXAMINATION  Initial Vital Signs Blood pressure 114/62, pulse 98, temperature 99.6 F (37.6 C), temperature source Oral, resp. rate 18, height 5\' 10"  (1.778 m), weight 117.9 kg, SpO2 97 %, currently breastfeeding.  Examination General: Well-developed, well-nourished female in no acute distress; appearance consistent with age of record HENT: normocephalic; atraumatic Eyes: pupils equal, round and reactive to light; extraocular muscles intact Neck: supple Heart: regular rate and rhythm Lungs: clear to auscultation bilaterally Abdomen:  soft; nondistended; mild right lower quadrant tenderness; bowel sounds present GU: No CVA tenderness Extremities: No deformity; full range of motion; pulses normal Neurologic: Awake, alert and oriented; motor function intact in all extremities and symmetric; no facial droop Skin: Warm and dry Psychiatric: Normal mood and affect   RESULTS  Summary of this visit's results, reviewed and interpreted by myself:   EKG Interpretation  Date/Time:    Ventricular Rate:    PR Interval:    QRS Duration:   QT Interval:    QTC Calculation:   R Axis:     Text Interpretation:        Laboratory Studies: Results for orders placed or performed during the hospital encounter of 10/24/20 (from the past 24 hour(s))  Lipase, blood     Status: None   Collection Time: 10/24/20 11:01 PM  Result Value Ref Range   Lipase 33 11 - 51 U/L  Comprehensive metabolic panel     Status: Abnormal   Collection Time: 10/24/20 11:01 PM  Result Value Ref Range   Sodium 137 135 - 145 mmol/L   Potassium 3.2 (L) 3.5 - 5.1 mmol/L   Chloride 107 98 - 111 mmol/L   CO2 21 (L) 22 - 32 mmol/L   Glucose, Bld 109 (H) 70 - 99 mg/dL   BUN 6 6 - 20 mg/dL   Creatinine, Ser 0.73 0.44 - 1.00 mg/dL   Calcium 8.7 (L) 8.9 - 10.3 mg/dL   Total Protein 7.6 6.5 - 8.1 g/dL   Albumin 4.1 3.5 - 5.0 g/dL   AST 26 15 - 41 U/L   ALT 33 0 - 44 U/L   Alkaline Phosphatase 67 38 - 126 U/L   Total Bilirubin 0.2 (L) 0.3 - 1.2 mg/dL   GFR, Estimated >60 >60 mL/min   Anion gap 9 5 - 15  CBC     Status: Abnormal   Collection Time: 10/24/20 11:01 PM  Result Value Ref Range   WBC 10.6 (H) 4.0 - 10.5 K/uL   RBC 4.65 3.87 - 5.11 MIL/uL   Hemoglobin 13.1 12.0 - 15.0 g/dL   HCT 38.5 36.0 - 46.0 %   MCV 82.8 80.0 - 100.0 fL   MCH 28.2 26.0 - 34.0 pg   MCHC 34.0 30.0 - 36.0 g/dL   RDW 13.2 11.5 - 15.5 %   Platelets 306 150 - 400 K/uL   nRBC 0.0 0.0 - 0.2 %  Urinalysis, Routine w reflex microscopic Urine, Clean Catch     Status: Abnormal    Collection Time: 10/25/20 12:23 AM  Result Value Ref Range   Color, Urine YELLOW YELLOW   APPearance CLEAR CLEAR   Specific Gravity, Urine 1.015 1.005 - 1.030   pH 6.0 5.0 - 8.0   Glucose, UA NEGATIVE NEGATIVE mg/dL   Hgb urine dipstick TRACE (A) NEGATIVE   Bilirubin Urine NEGATIVE NEGATIVE   Ketones, ur  NEGATIVE NEGATIVE mg/dL   Protein, ur NEGATIVE NEGATIVE mg/dL   Nitrite NEGATIVE NEGATIVE   Leukocytes,Ua NEGATIVE NEGATIVE  Pregnancy, urine     Status: None   Collection Time: 10/25/20 12:23 AM  Result Value Ref Range   Preg Test, Ur NEGATIVE NEGATIVE  Urinalysis, Microscopic (reflex)     Status: Abnormal   Collection Time: 10/25/20 12:23 AM  Result Value Ref Range   RBC / HPF 0-5 0 - 5 RBC/hpf   WBC, UA 0-5 0 - 5 WBC/hpf   Bacteria, UA RARE (A) NONE SEEN   Squamous Epithelial / LPF 0-5 0 - 5   Imaging Studies: CT ABDOMEN PELVIS WO CONTRAST  Result Date: 10/25/2020 CLINICAL DATA:  Right lower quadrant pain for 2 days EXAM: CT ABDOMEN AND PELVIS WITHOUT CONTRAST TECHNIQUE: Multidetector CT imaging of the abdomen and pelvis was performed following the standard protocol without IV contrast. COMPARISON:  None. FINDINGS: Lower chest: No acute abnormality. Hepatobiliary: No focal liver abnormality is seen. No gallstones, gallbladder wall thickening, or biliary dilatation. Pancreas: Unremarkable. No pancreatic ductal dilatation or surrounding inflammatory changes. Spleen: Normal in size without focal abnormality. Adrenals/Urinary Tract: Adrenal glands are within normal limits. Kidneys demonstrate nonobstructing small renal calculus on the left. The left ureter is within normal limits. Bladder is decompressed. Right kidney demonstrates no renal calculi or obstructive changes. Stomach/Bowel: No obstructive or inflammatory changes of the colon are seen. The appendix is within normal limits. No small bowel abnormality is noted. The stomach is within normal limits. Vascular/Lymphatic: No  significant vascular findings are present. No enlarged abdominal or pelvic lymph nodes. Reproductive: Uterus and bilateral adnexa are unremarkable. Other: No abdominal wall hernia or abnormality. No abdominopelvic ascites. 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 No acute bony abnormality is noted. IMPRESSION: Tiny nonobstructing left renal stone. No obstructive changes are seen. Appendix is within normal limits. No acute abnormality is noted. Electronically Signed   By: Inez Catalina M.D.   On: 10/25/2020 01:07    ED COURSE and MDM  Nursing notes, initial and subsequent vitals signs, including pulse oximetry, reviewed and interpreted by myself.  Vitals:   10/24/20 2300 10/24/20 2330 10/25/20 0000 10/25/20 0111  BP: 127/63 114/62 124/67 126/70  Pulse: (!) 113 98 95 86  Resp: 16 18 16 16   Temp:      TempSrc:      SpO2: 97% 97% 98% 98%  Weight:      Height:       Medications  dicyclomine (BENTYL) injection 20 mg (has no administration in time range)  ondansetron (ZOFRAN) injection 4 mg (4 mg Intravenous Given 10/24/20 2311)  sodium chloride 0.9 % bolus 1,000 mL ( Intravenous Stopped 10/25/20 0022)  potassium chloride SA (KLOR-CON) CR tablet 40 mEq (40 mEq Oral Given 10/25/20 0021)   1:15 AM Patient advised of reassuring CT scan and lab studies.  I suspect her symptoms are due to a viral gastrointestinal virus.  We will treat with Bentyl.  PROCEDURES  Procedures   ED DIAGNOSES     ICD-10-CM   1. Abdominal cramping  R10.9        Janazia Schreier, Jenny Reichmann, MD 10/25/20 [phone]

## 2020-10-25 ENCOUNTER — Emergency Department (HOSPITAL_BASED_OUTPATIENT_CLINIC_OR_DEPARTMENT_OTHER): Payer: 59

## 2020-10-25 LAB — URINALYSIS, ROUTINE W REFLEX MICROSCOPIC
Bilirubin Urine: NEGATIVE
Glucose, UA: NEGATIVE mg/dL
Ketones, ur: NEGATIVE mg/dL
Leukocytes,Ua: NEGATIVE
Nitrite: NEGATIVE
Protein, ur: NEGATIVE mg/dL
Specific Gravity, Urine: 1.015 (ref 1.005–1.030)
pH: 6 (ref 5.0–8.0)

## 2020-10-25 LAB — URINALYSIS, MICROSCOPIC (REFLEX)

## 2020-10-25 LAB — PREGNANCY, URINE: Preg Test, Ur: NEGATIVE

## 2020-10-25 MED ORDER — DICYCLOMINE HCL 10 MG/ML IM SOLN
20.0000 mg | Freq: Once | INTRAMUSCULAR | Status: AC
  Administered 2020-10-25: 20 mg via INTRAMUSCULAR
  Filled 2020-10-25: qty 2

## 2020-10-25 MED ORDER — POTASSIUM CHLORIDE CRYS ER 20 MEQ PO TBCR
40.0000 meq | EXTENDED_RELEASE_TABLET | Freq: Once | ORAL | Status: AC
  Administered 2020-10-25: 40 meq via ORAL
  Filled 2020-10-25: qty 2

## 2020-10-25 MED ORDER — DICYCLOMINE HCL 20 MG PO TABS: 20.0000 mg | ORAL_TABLET | Freq: Four times a day (QID) | ORAL | 0 refills | Status: DC | PRN

## 2020-10-25 NOTE — ED Notes (Signed)
Patient transported to CT 

## 2020-12-15 ENCOUNTER — Inpatient Hospital Stay: Payer: 59 | Attending: Hematology and Oncology | Admitting: Hematology and Oncology

## 2020-12-15 ENCOUNTER — Inpatient Hospital Stay: Payer: 59

## 2020-12-15 ENCOUNTER — Other Ambulatory Visit: Payer: Self-pay

## 2020-12-15 VITALS — BP 133/59 | HR 85 | Temp 97.7°F | Resp 17 | Ht 70.0 in | Wt 270.0 lb

## 2020-12-15 DIAGNOSIS — Z800 Family history of malignant neoplasm of digestive organs: Secondary | ICD-10-CM

## 2020-12-15 DIAGNOSIS — Z90721 Acquired absence of ovaries, unilateral: Secondary | ICD-10-CM | POA: Diagnosis not present

## 2020-12-15 DIAGNOSIS — Z87891 Personal history of nicotine dependence: Secondary | ICD-10-CM

## 2020-12-15 DIAGNOSIS — D8940 Mast cell activation, unspecified: Secondary | ICD-10-CM

## 2020-12-15 DIAGNOSIS — R21 Rash and other nonspecific skin eruption: Secondary | ICD-10-CM

## 2020-12-15 DIAGNOSIS — R232 Flushing: Secondary | ICD-10-CM | POA: Diagnosis not present

## 2020-12-15 LAB — CMP (CANCER CENTER ONLY)
ALT: 21 U/L (ref 0–44)
AST: 16 U/L (ref 15–41)
Albumin: 4.2 g/dL (ref 3.5–5.0)
Alkaline Phosphatase: 67 U/L (ref 38–126)
Anion gap: 7 (ref 5–15)
BUN: 9 mg/dL (ref 6–20)
CO2: 24 mmol/L (ref 22–32)
Calcium: 9.2 mg/dL (ref 8.9–10.3)
Chloride: 109 mmol/L (ref 98–111)
Creatinine: 0.8 mg/dL (ref 0.44–1.00)
GFR, Estimated: >60 mL/min (ref >60)
Glucose, Bld: 100 mg/dL — ABNORMAL HIGH (ref 70–99)
Potassium: 3.8 mmol/L (ref 3.5–5.1)
Sodium: 140 mmol/L (ref 135–145)
Total Bilirubin: 0.3 mg/dL (ref 0.3–1.2)
Total Protein: 7.6 g/dL (ref 6.5–8.1)

## 2020-12-15 LAB — CBC WITH DIFFERENTIAL (CANCER CENTER ONLY)
Abs Immature Granulocytes: 0.03 10*3/uL (ref 0.00–0.07)
Basophils Absolute: 0.1 10*3/uL (ref 0.0–0.1)
Basophils Relative: 1 %
Eosinophils Absolute: 0.2 10*3/uL (ref 0.0–0.5)
Eosinophils Relative: 2 %
HCT: 38.2 % (ref 36.0–46.0)
Hemoglobin: 12.8 g/dL (ref 12.0–15.0)
Immature Granulocytes: 0 %
Lymphocytes Relative: 35 %
Lymphs Abs: 4 10*3/uL (ref 0.7–4.0)
MCH: 28.1 pg (ref 26.0–34.0)
MCHC: 33.5 g/dL (ref 30.0–36.0)
MCV: 83.8 fL (ref 80.0–100.0)
Monocytes Absolute: 0.5 10*3/uL (ref 0.1–1.0)
Monocytes Relative: 5 %
Neutro Abs: 6.6 10*3/uL (ref 1.7–7.7)
Neutrophils Relative %: 57 %
Platelet Count: 353 10*3/uL (ref 150–400)
RBC: 4.56 MIL/uL (ref 3.87–5.11)
RDW: 13.9 % (ref 11.5–15.5)
WBC Count: 11.5 10*3/uL — ABNORMAL HIGH (ref 4.0–10.5)
nRBC: 0 % (ref 0.0–0.2)

## 2020-12-15 LAB — LACTATE DEHYDROGENASE: LDH: 188 U/L (ref 98–192)

## 2020-12-15 NOTE — Progress Notes (Signed)
[place] [phone]   [phone] [place] NOTE  Patient Care Team: Marda Stalker, PA-C as PCP - General (Family Medicine)  Hematological/Oncological History # Concern for Mast Cell Syndrome  CHIEF COMPLAINTS/PURPOSE OF CONSULTATION:  "Concern for Mast Cell Syndrome "  HISTORY OF PRESENTING ILLNESS:  Chelsea Day 25 y.o. female with medical history significant for bilateral ovarian cysts, pregnancy induced hypertensions, endometriosis, bilateral ovarian cysts who presents for concern of a mast cell disorder.   On review of the previous records Mrs. Joerger previously undergone evaluation with allergy and immunology, rheumatology, gastroenterology, and dermatology with no clear etiology found for this patient's flushing of the skin around the neck and chest.  Due to concern for possible mast cell disorder the patient was referred to hematology for further evaluation management.  On exam today Mrs. Jaskulski reports that all of her symptoms began approximately 1.5 years ago after the birth of her daughter.  She notes that it began with flushing in the face and the chest and so she initially cut out gluten and certain sugars from her diet as she felt these made her symptoms worse.  Unfortunately she has continued to have these episodes which occur multiple times per day.  She does that she has difficulty with her stomach with alternating diarrhea and constipation.  She also has some painful leg cramps which occur periodically.  Due to the rash nature of this she was seen by dermatology have not yet performed a biopsy.  She also was seen by allergist who found that she was negative for allergies to metals, foods, and other substances.  GI evaluated her with concern for carcinoid syndrome was unable to find a clear etiology for her symptoms.  She notes that she does take antihistamine therapy sonicated but that does not help to relieve resolve  her symptoms.  She notes that there are certain foods she can eat which make her symptoms worse and can also give her headache.  She reports that her medical history significant for MS in her mother and colon cancer in her maternal grandmother as well as colon cancer in her maternal aunt.  She notes that she was a smoker and quit in 2015.  She does drink some occasional alcohol but does not currently vape or use e-cigarettes.  She currently works in a Personal assistant.  A full 10 point ROS is listed below  MEDICAL HISTORY:  Past Medical History:  Diagnosis Date  . Bilateral ovarian cysts   . Endometriosis determined by laparoscopy   . History of postpartum uterine bleeding   . Irregular periods/menstrual cycles   . Pregnancy induced hypertension     SURGICAL HISTORY: Past Surgical History:  Procedure Laterality Date  . LAPAROSCOPIC OVARIAN CYSTECTOMY Right 01/24/2015   Procedure: LAPAROSCOPIC LYSIS OF ADHESION, LAPROSCOPIC RIGHT SALPINGOOOPHORECTOMY, BIOPSY OF RIGHT BOARD LIGAMENT; LYSIS BIOPSY OF ADHESIONS, BIOPSY OF LEFT UTERO LIGAMENT;  Surgeon: Ena Dawley, MD;  Location: [facility];  Service: Gynecology;  Laterality: Right;  . LAPAROSCOPY  11/08/2011   Procedure: LAPAROSCOPY OPERATIVE;  Surgeon: Olga Millers, MD;  Location: [facility];  Service: Gynecology;  Laterality: Right;  . OVARIAN CYST REMOVAL  11/08/2011   Procedure: OVARIAN CYSTECTOMY;  Surgeon: Olga Millers, MD;  Location: [facility];  Service: Gynecology;  Laterality: Right;  . UNILATERAL SALPINGECTOMY Right 01/24/15    SOCIAL HISTORY: Social History   Socioeconomic History  . Marital status: Married    Spouse name: Not on file  .  Number of children: Not on file  . Years of education: Not on file  . Highest education level: Not on file  Occupational History  . Not on file  Tobacco Use  . Smoking status: Former Smoker    Types: Cigarettes    Quit date: 11/30/2013    Years since quitting: 7.0  . Smokeless tobacco:  Never Used  Vaping Use  . Vaping Use: Never used  Substance and Sexual Activity  . Alcohol use: No    Alcohol/week: 0.0 standard drinks    Comment: social, not while pregnant  . Drug use: No  . Sexual activity: Yes  Other Topics Concern  . Not on file  Social History Narrative  . Not on file   Social Determinants of Health   Financial Resource Strain: Not on file  Food Insecurity: Not on file  Transportation Needs: Not on file  Physical Activity: Not on file  Stress: Not on file  Social Connections: Not on file  Intimate Partner Violence: Not on file    FAMILY HISTORY: Family History  Problem Relation Age of Onset  . Cancer Father        ???  . Multiple sclerosis Mother   . Colon cancer Maternal Grandmother   . Colon cancer Maternal Aunt   . Allergic rhinitis Neg Hx   . Angioedema Neg Hx   . Asthma Neg Hx   . Eczema Neg Hx   . Immunodeficiency Neg Hx   . Urticaria Neg Hx     ALLERGIES:  is allergic to contrast media [iodinated diagnostic agents] and other.  MEDICATIONS:  Current Outpatient Medications  Medication Sig Dispense Refill  . cetirizine (ZYRTEC) 10 MG chewable tablet Chew 10 mg by mouth daily.    Marland Kitchen dicyclomine (BENTYL) 20 MG tablet Take 1 tablet (20 mg total) by mouth 4 (four) times daily as needed (abdominal cramping). 20 tablet 0  . ibuprofen (ADVIL) 600 MG tablet Take 1 tablet (600 mg total) by mouth every 6 (six) hours. (Patient taking differently: Take 600 mg by mouth every 6 (six) hours as needed for fever, headache or mild pain. ) 30 tablet 0   No current facility-administered medications for this visit.    REVIEW OF SYSTEMS:   Constitutional: ( - ) fevers, ( - )  chills , ( - ) night sweats Eyes: ( - ) blurriness of vision, ( - ) double vision, ( - ) watery eyes Ears, nose, mouth, throat, and face: ( - ) mucositis, ( - ) sore throat Respiratory: ( - ) cough, ( - ) dyspnea, ( - ) wheezes Cardiovascular: ( - ) palpitation, ( - ) chest  discomfort, ( - ) lower extremity swelling Gastrointestinal:  ( - ) nausea, ( - ) heartburn, ( - ) change in bowel habits Skin: ( - ) abnormal skin rashes Lymphatics: ( - ) new lymphadenopathy, ( - ) easy bruising Neurological: ( - ) numbness, ( - ) tingling, ( - ) new weaknesses Behavioral/Psych: ( - ) mood change, ( - ) new changes  All other systems were reviewed with the patient and are negative.  PHYSICAL EXAMINATION:  Vitals:   12/15/20 1322  BP: (!) 133/59  Pulse: 85  Resp: 17  Temp: 97.7 F (36.5 C)  SpO2: 100%   Filed Weights   12/15/20 1322  Weight: 270 lb (122.5 kg)    GENERAL: well appearing young Caucasian female in NAD  SKIN: skin color, texture, turgor are normal, no rashes or  significant lesions EYES: conjunctiva are pink and non-injected, sclera clear LUNGS: clear to auscultation and percussion with normal breathing effort HEART: regular rate & rhythm and no murmurs and no lower extremity edema Musculoskeletal: no cyanosis of digits and no clubbing  PSYCH: alert & oriented x 3, fluent speech NEURO: no focal motor/sensory deficits  LABORATORY DATA:  I have reviewed the data as listed CBC Latest Ref Rng & Units 10/24/2020 06/12/2020 02/13/2020  WBC 4.0 - 10.5 K/uL 10.6(H) 13.4(H) 8.1  Hemoglobin 12.0 - 15.0 g/dL 13.1 14.1 13.1  Hematocrit 36.0 - 46.0 % 38.5 43.4 40.8  Platelets 150 - 400 K/uL 306 367 302    CMP Latest Ref Rng & Units 10/24/2020 06/12/2020 02/13/2020  Glucose 70 - 99 mg/dL 109(H) 103(H) 92  BUN 6 - 20 mg/dL 6 10 9   Creatinine 0.44 - 1.00 mg/dL 0.73 0.65 0.71  Sodium 135 - 145 mmol/L 137 139 142  Potassium 3.5 - 5.1 mmol/L 3.2(L) 4.6 4.5  Chloride 98 - 111 mmol/L 107 108 108(H)  CO2 22 - 32 mmol/L 21(L) 20(L) 21  Calcium 8.9 - 10.3 mg/dL 8.7(L) 9.3 9.4  Total Protein 6.5 - 8.1 g/dL 7.6 - 7.1  Total Bilirubin 0.3 - 1.2 mg/dL 0.2(L) - 0.3  Alkaline Phos 38 - 126 U/L 67 - 101  AST 15 - 41 U/L 26 - 17  ALT 0 - 44 U/L 33 - 27     RADIOGRAPHIC STUDIES: No results found.  ASSESSMENT & PLAN Aspen Lawrance 25 y.o. female with medical history significant for bilateral ovarian cysts, pregnancy induced hypertensions, endometriosis, bilateral ovarian cysts who presents for concern of a mast cell disorder.   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  During her clinic visit today we witnessed 1 of these flushing rashes occur on the patient's neck and chest.  The etiology was entirely unclear based on our evaluation and based on the prior work-up from several specialist.  I do believe the patient has some form of underlying disorder, though he currently evades our testing.  In order to help rule out mast cell disease we will order numerous baseline labs including tryptase (given her active flares), urine metanephrines, and CBC CMP LDH.  In the event that our work-up is negative I would recommend consideration of bone marrow biopsy, though I noted to the patient that this would likely be of low yield.  Despite this she would be willing to go through the bone marrow biopsy event are above work-up was negative.  #Concern for Mast Cell Disease -- Patient did develop flushing and a rash on her neck and chest during her clinic visit.  Was not present at the beginning of the visit. -- The etiology of this patient's findings is not clear.  She has undergone extensive testing with multiple specialties with no clear etiology found. -- As part of her allergy work-up she had tryptase tested which was negative.  Tryptase is typically elevated in mast cell syndromes. -- Today we will order urine metanephrines as well as CBC, CMP, LDH, IgE, and repeat tryptase given the fact that she is now actively flaring -- In the event no clear etiology can be found we could consider a bone marrow biopsy to look for mast cell  population.  I noted to the patient that this would not likely be of high yield but that this is  something we can consider to help effectively rule out mast cell syndromes. -- In the event we are not able to find a clear etiology for this patient I would recommend referral to [facility] or [facility]'s allergy teams for further evaluation -- Return to clinic pending the work-up above.  No orders of the defined types were placed in this encounter.   All questions were answered. The patient knows to call the clinic with any problems, questions or concerns.  A total of more than 60 minutes were spent on this encounter with face-to-face time and non-face-to-face time, including preparing to see the patient, ordering tests and/or medications, counseling the patient and coordination of care as outlined above.   Ledell Peoples, MD Department of Hematology/Oncology [place] at [facility] Phone: [phone] Pager: [phone] Email: [email or web address]  12/15/2020 1:37 PM

## 2020-12-16 LAB — TRYPTASE: Tryptase: 3.3 ug/L (ref 2.2–13.2)

## 2020-12-18 ENCOUNTER — Encounter: Payer: Self-pay | Admitting: Hematology and Oncology

## 2020-12-21 LAB — IGE: IgE (Immunoglobulin E), Serum: 53 IU/mL (ref 6–495)

## 2020-12-25 ENCOUNTER — Telehealth: Payer: Self-pay | Admitting: *Deleted

## 2020-12-25 NOTE — Telephone Encounter (Signed)
-----  Message from Orson Slick, MD sent at 12/25/2020 10:29 AM EDT ----- Please let Chelsea Day know that her bloodwork did not show any clear abnormalities pointing [place] toward a mast cell disease. We did order a special urine study (plasma metanephrines), but I do not see that this has been sent (?). Please check with the patient to see if this has been provided. If she would like, we can pursue a bone marrow biopsy to look for a mast cell disorder, though I think there is a very low chance we will find one. If she would prefer we can make a referral to allergy/immunology at [facility] or [facility] for a second opinion.  ----- Message ----- From: Buel Ream, Lab In [place] Sent: 12/15/2020   3:11 PM EDT To: Orson Slick, MD

## 2020-12-25 NOTE — Telephone Encounter (Signed)
TCT patient regarding  Recent lab results. No answer but was able to leave detailed vm messge on her identified phone #.  Advised that her lab results did not show any abnormalities that indicate mast cell disease.  Asked pt is she has provided [place] with her 24 urine sample.  Also asked if she wanted to pursue a bone marrow biopsy or a second opinion at [facility] or [facility] with their allergy/immunology depts. Asked her to call back @ [phone] at her convenience

## 2021-07-20 ENCOUNTER — Other Ambulatory Visit: Payer: Self-pay | Admitting: Orthopedic Surgery

## 2021-07-20 ENCOUNTER — Other Ambulatory Visit (HOSPITAL_COMMUNITY): Payer: Self-pay | Admitting: Orthopedic Surgery

## 2021-07-20 DIAGNOSIS — M545 Low back pain, unspecified: Secondary | ICD-10-CM

## 2021-08-04 ENCOUNTER — Ambulatory Visit (HOSPITAL_COMMUNITY): Payer: 59

## 2021-08-04 ENCOUNTER — Encounter (HOSPITAL_COMMUNITY): Payer: Self-pay

## 2022-01-07 HISTORY — PX: LAPAROSCOPIC APPENDECTOMY: SHX408

## 2022-05-28 ENCOUNTER — Other Ambulatory Visit: Payer: Self-pay | Admitting: Family Medicine

## 2022-05-28 DIAGNOSIS — N644 Mastodynia: Secondary | ICD-10-CM

## 2022-06-02 ENCOUNTER — Other Ambulatory Visit: Payer: 59

## 2022-06-03 ENCOUNTER — Ambulatory Visit
Admission: RE | Admit: 2022-06-03 | Discharge: 2022-06-03 | Disposition: A | Discharge status: Home / Self Care | Payer: Commercial Managed Care - HMO | Source: Ambulatory Visit | Attending: Family Medicine | Admitting: Family Medicine

## 2022-06-03 DIAGNOSIS — N644 Mastodynia: Secondary | ICD-10-CM

## 2022-06-16 ENCOUNTER — Other Ambulatory Visit: Payer: 59

## 2023-07-06 ENCOUNTER — Emergency Department (HOSPITAL_BASED_OUTPATIENT_CLINIC_OR_DEPARTMENT_OTHER): Payer: 59

## 2023-07-06 ENCOUNTER — Other Ambulatory Visit: Payer: Self-pay

## 2023-07-06 ENCOUNTER — Emergency Department (HOSPITAL_BASED_OUTPATIENT_CLINIC_OR_DEPARTMENT_OTHER): Payer: 59 | Admitting: Radiology

## 2023-07-06 ENCOUNTER — Emergency Department (HOSPITAL_BASED_OUTPATIENT_CLINIC_OR_DEPARTMENT_OTHER)
Admission: EM | Admit: 2023-07-06 | Discharge: 2023-07-06 | Disposition: A | Discharge status: Home / Self Care | Payer: 59 | Attending: Emergency Medicine | Admitting: Emergency Medicine

## 2023-07-06 ENCOUNTER — Encounter (HOSPITAL_BASED_OUTPATIENT_CLINIC_OR_DEPARTMENT_OTHER): Payer: Self-pay | Admitting: Emergency Medicine

## 2023-07-06 DIAGNOSIS — Z91040 Latex allergy status: Secondary | ICD-10-CM | POA: Diagnosis not present

## 2023-07-06 DIAGNOSIS — E860 Dehydration: Secondary | ICD-10-CM | POA: Insufficient documentation

## 2023-07-06 DIAGNOSIS — R1011 Right upper quadrant pain: Secondary | ICD-10-CM | POA: Diagnosis present

## 2023-07-06 DIAGNOSIS — Z20822 Contact with and (suspected) exposure to covid-19: Secondary | ICD-10-CM | POA: Diagnosis not present

## 2023-07-06 DIAGNOSIS — K529 Noninfective gastroenteritis and colitis, unspecified: Secondary | ICD-10-CM | POA: Insufficient documentation

## 2023-07-06 LAB — URINALYSIS, ROUTINE W REFLEX MICROSCOPIC
Bilirubin Urine: NEGATIVE
Glucose, UA: NEGATIVE mg/dL
Hgb urine dipstick: NEGATIVE
Ketones, ur: NEGATIVE mg/dL
Nitrite: NEGATIVE
Specific Gravity, Urine: 1.03 (ref 1.005–1.030)
pH: 6 (ref 5.0–8.0)

## 2023-07-06 LAB — BASIC METABOLIC PANEL
Anion gap: 10 (ref 5–15)
BUN: 9 mg/dL (ref 6–20)
CO2: 26 mmol/L (ref 22–32)
Calcium: 9.7 mg/dL (ref 8.9–10.3)
Chloride: 105 mmol/L (ref 98–111)
Creatinine, Ser: 0.79 mg/dL (ref 0.44–1.00)
GFR, Estimated: >60 mL/min (ref >60)
Glucose, Bld: 106 mg/dL — ABNORMAL HIGH (ref 70–99)
Potassium: 3.3 mmol/L — ABNORMAL LOW (ref 3.5–5.1)
Sodium: 141 mmol/L (ref 135–145)

## 2023-07-06 LAB — RESP PANEL BY RT-PCR (RSV, FLU A&B, COVID)  RVPGX2
Influenza A by PCR: NEGATIVE
Influenza B by PCR: NEGATIVE
Resp Syncytial Virus by PCR: NEGATIVE
SARS Coronavirus 2 by RT PCR: NEGATIVE

## 2023-07-06 LAB — HEPATIC FUNCTION PANEL
ALT: 29 U/L (ref 0–44)
AST: 22 U/L (ref 15–41)
Albumin: 4.6 g/dL (ref 3.5–5.0)
Alkaline Phosphatase: 55 U/L (ref 38–126)
Bilirubin, Direct: 0.1 mg/dL (ref 0.0–0.2)
Indirect Bilirubin: 0.4 mg/dL (ref 0.3–0.9)
Total Bilirubin: 0.5 mg/dL (ref <1.2)
Total Protein: 7.9 g/dL (ref 6.5–8.1)

## 2023-07-06 LAB — CBC
HCT: 41.8 % (ref 36.0–46.0)
Hemoglobin: 14.1 g/dL (ref 12.0–15.0)
MCH: 28.6 pg (ref 26.0–34.0)
MCHC: 33.7 g/dL (ref 30.0–36.0)
MCV: 84.8 fL (ref 80.0–100.0)
Platelets: 306 10*3/uL (ref 150–400)
RBC: 4.93 MIL/uL (ref 3.87–5.11)
RDW: 13.2 % (ref 11.5–15.5)
WBC: 6.5 10*3/uL (ref 4.0–10.5)
nRBC: 0 % (ref 0.0–0.2)

## 2023-07-06 LAB — PREGNANCY, URINE: Preg Test, Ur: NEGATIVE

## 2023-07-06 LAB — LIPASE, BLOOD: Lipase: 66 U/L — ABNORMAL HIGH (ref 11–51)

## 2023-07-06 LAB — CBG MONITORING, ED: Glucose-Capillary: 90 mg/dL (ref 70–99)

## 2023-07-06 MED ORDER — SODIUM CHLORIDE 0.9 % IV BOLUS
1000.0000 mL | Freq: Once | INTRAVENOUS | Status: AC
  Administered 2023-07-06: 1000 mL via INTRAVENOUS

## 2023-07-06 MED ORDER — ERYTHROMYCIN 5 MG/GM OP OINT
TOPICAL_OINTMENT | Freq: Once | OPHTHALMIC | Status: AC
  Filled 2023-07-06: qty 3.5

## 2023-07-06 MED ORDER — SODIUM CHLORIDE 0.9 % IV BOLUS
500.0000 mL | Freq: Once | INTRAVENOUS | Status: AC
  Administered 2023-07-06: 500 mL via INTRAVENOUS

## 2023-07-06 MED ORDER — ONDANSETRON HCL 4 MG/2ML IJ SOLN
4.0000 mg | Freq: Once | INTRAMUSCULAR | Status: AC
  Administered 2023-07-06: 4 mg via INTRAVENOUS
  Filled 2023-07-06: qty 2

## 2023-07-06 MED ORDER — ONDANSETRON HCL 4 MG PO TABS: 4.0000 mg | ORAL_TABLET | Freq: Four times a day (QID) | ORAL | 0 refills | Status: DC

## 2023-07-06 NOTE — Discharge Instructions (Addendum)
Take zofran for nausea every 8 hours as needed. Push fluids in small amounts, frequently. Advance diet as tolerated.   Return to the ED with any worsening symptoms, uncontrolled vomiting or worsening pain, if you develop a high fever, start seeing large volume of blood in either your vomit or diarrhea. Otherwise, follow up with your doctor for recheck if symptoms persist.

## 2023-07-06 NOTE — ED Notes (Signed)
Called lab to add lipase and hepatic function.

## 2023-07-06 NOTE — ED Provider Notes (Signed)
[place] EMERGENCY DEPARTMENT AT [facility] Provider Note   CSN: [id number] Arrival date & time: 07/06/23  1450     History  Chief Complaint  Patient presents with   Emesis    Chelsea Day is a 27 y.o. female.  Patient to ED with RUQ abdominal pain, radiates around to back, started 2 days ago. She is having nausea with nonbilious vomiting. No hematemesis. She is having diarrhea that is nonbloody. She reports she had a fever the day prior to onset of abdominal pain but none since. She states the symptoms are worse with any attempt to eat or drink. She is not tolerating any PO intake x 2 days secondary to pain and vomiting. She also reports urination x 1 today. She states she passed out while walking into the ED which was witnessed by registration. She also reports right eye pain and redness in the eye and lateral to it since she started vomiting.   The history is provided by the patient. No language interpreter was used.  Emesis      Home Medications Prior to Admission medications   Medication Sig Start Date End Date Taking? Authorizing Provider  ondansetron (ZOFRAN) 4 MG tablet Take 1 tablet (4 mg total) by mouth every 6 (six) hours. 07/06/23  Yes Elpidio Anis, PA-C  cetirizine (ZYRTEC) 10 MG chewable tablet Chew 10 mg by mouth daily.    [provider]  dicyclomine (BENTYL) 20 MG tablet Take 1 tablet (20 mg total) by mouth 4 (four) times daily as needed (abdominal cramping). 10/25/20   Molpus, John, MD  ibuprofen (ADVIL) 600 MG tablet Take 1 tablet (600 mg total) by mouth every 6 (six) hours. Patient taking differently: Take 600 mg by mouth every 6 (six) hours as needed for fever, headache or mild pain.  09/03/19   Geryl Rankins, MD  sertraline (ZOLOFT) 50 MG tablet Take 1 tablet (50 mg total) by mouth daily. Patient not taking: Reported on 06/12/2020 09/03/19 10/25/20  Geryl Rankins, MD      Allergies    Contrast media [iodinated contrast media], Other,  and Latex    Review of Systems   Review of Systems  Gastrointestinal:  Positive for vomiting.    Physical Exam Updated Vital Signs BP 108/63   Pulse 77   Temp 98.1 F (36.7 C)   Resp 16   Ht 5\' 10"  (1.778 m)   Wt 117.9 kg   SpO2 100%   Breastfeeding No   BMI 37.31 kg/m  Physical Exam Vitals and nursing note reviewed.  Constitutional:      Appearance: Normal appearance. She is obese.  HENT:     Head: Normocephalic.     Mouth/Throat:     Mouth: Mucous membranes are dry.  Eyes:     Pupils: Pupils are equal, round, and reactive to light.     Comments: Very small subconjunctival hemorrhage to lateral right eye. There is conjunctival redness without chemosis as well. No purulent drainage. The lateral periorbital area has patchy redness without edema.   Cardiovascular:     Rate and Rhythm: Normal rate.  Pulmonary:     Effort: Pulmonary effort is normal.  Abdominal:     Palpations: Abdomen is soft.     Comments: RUQ tenderness without guarding. Abdomen soft.   Musculoskeletal:        General: Normal range of motion.  Skin:    General: Skin is warm and dry.  Neurological:     Mental Status: She is  alert and oriented to person, place, and time.     ED Results / Procedures / Treatments   Labs (all labs ordered are listed, but only abnormal results are displayed) Labs Reviewed  BASIC METABOLIC PANEL - Abnormal; Notable for the following components:      Result Value   Potassium 3.3 (*)    Glucose, Bld 106 (*)    All other components within normal limits  URINALYSIS, ROUTINE W REFLEX MICROSCOPIC - Abnormal; Notable for the following components:   Protein, ur TRACE (*)    Leukocytes,Ua SMALL (*)    Bacteria, UA RARE (*)    All other components within normal limits  LIPASE, BLOOD - Abnormal; Notable for the following components:   Lipase 66 (*)    All other components within normal limits  RESP PANEL BY RT-PCR (RSV, FLU A&B, COVID)  RVPGX2  CBC  PREGNANCY, URINE   HEPATIC FUNCTION PANEL  CBG MONITORING, ED   Results for orders placed or performed during the hospital encounter of 07/06/23  Resp panel by RT-PCR (RSV, Flu A&B, Covid) Anterior Nasal Swab   Specimen: Anterior Nasal Swab  Result Value Ref Range   SARS Coronavirus 2 by RT PCR NEGATIVE NEGATIVE   Influenza A by PCR NEGATIVE NEGATIVE   Influenza B by PCR NEGATIVE NEGATIVE   Resp Syncytial Virus by PCR NEGATIVE NEGATIVE  Basic metabolic panel  Result Value Ref Range   Sodium 141 135 - 145 mmol/L   Potassium 3.3 (L) 3.5 - 5.1 mmol/L   Chloride 105 98 - 111 mmol/L   CO2 26 22 - 32 mmol/L   Glucose, Bld 106 (H) 70 - 99 mg/dL   BUN 9 6 - 20 mg/dL   Creatinine, Ser 8.29 0.44 - 1.00 mg/dL   Calcium 9.7 8.9 - 56.2 mg/dL   GFR, Estimated >13 >08 mL/min   Anion gap 10 5 - 15  CBC  Result Value Ref Range   WBC 6.5 4.0 - 10.5 K/uL   RBC 4.93 3.87 - 5.11 MIL/uL   Hemoglobin 14.1 12.0 - 15.0 g/dL   HCT 65.7 84.6 - 96.2 %   MCV 84.8 80.0 - 100.0 fL   MCH 28.6 26.0 - 34.0 pg   MCHC 33.7 30.0 - 36.0 g/dL   RDW 95.2 84.1 - 32.4 %   Platelets 306 150 - 400 K/uL   nRBC 0.0 0.0 - 0.2 %  Urinalysis, Routine w reflex microscopic -Urine, Clean Catch  Result Value Ref Range   Color, Urine YELLOW YELLOW   APPearance CLEAR CLEAR   Specific Gravity, Urine 1.030 1.005 - 1.030   pH 6.0 5.0 - 8.0   Glucose, UA NEGATIVE NEGATIVE mg/dL   Hgb urine dipstick NEGATIVE NEGATIVE   Bilirubin Urine NEGATIVE NEGATIVE   Ketones, ur NEGATIVE NEGATIVE mg/dL   Protein, ur TRACE (A) NEGATIVE mg/dL   Nitrite NEGATIVE NEGATIVE   Leukocytes,Ua SMALL (A) NEGATIVE   RBC / HPF 11-20 0 - 5 RBC/hpf   WBC, UA 6-10 0 - 5 WBC/hpf   Bacteria, UA RARE (A) NONE SEEN   Squamous Epithelial / HPF 6-10 0 - 5 /HPF   Mucus PRESENT    Ca Oxalate Crys, UA PRESENT   Pregnancy, urine  Result Value Ref Range   Preg Test, Ur NEGATIVE NEGATIVE  Lipase, blood  Result Value Ref Range   Lipase 66 (H) 11 - 51 U/L  Hepatic function  panel  Result Value Ref Range   Total Protein 7.9 6.5 -  8.1 g/dL   Albumin 4.6 3.5 - 5.0 g/dL   AST 22 15 - 41 U/L   ALT 29 0 - 44 U/L   Alkaline Phosphatase 55 38 - 126 U/L   Total Bilirubin 0.5 <1.2 mg/dL   Bilirubin, Direct 0.1 0.0 - 0.2 mg/dL   Indirect Bilirubin 0.4 0.3 - 0.9 mg/dL  CBG monitoring, ED  Result Value Ref Range   Glucose-Capillary 90 70 - 99 mg/dL    EKG EKG Interpretation Date/Time:  Wednesday July 06 2023 15:13:55 EST Ventricular Rate:  92 PR Interval:  147 QRS Duration:  86 QT Interval:  375 QTC Calculation: 464 R Axis:   77  Text Interpretation: Sinus rhythm Confirmed by Glyn Ade [phone]) on 07/06/2023 7:35:45 PM  Radiology DG Chest 2 View  Result Date: 07/06/2023 CLINICAL DATA:  Nausea vomiting diarrhea EXAM: CHEST - 2 VIEW COMPARISON:  06/12/2020 FINDINGS: The heart size and mediastinal contours are within normal limits. Both lungs are clear. The visualized skeletal structures are unremarkable. IMPRESSION: No active cardiopulmonary disease. Electronically Signed   By: Jasmine Pang M.D.   On: 07/06/2023 19:29   US Abdomen Limited RUQ (LIVER/GB)  Result Date: 07/06/2023 CLINICAL DATA:  Right upper quadrant pain EXAM: ULTRASOUND ABDOMEN LIMITED RIGHT UPPER QUADRANT COMPARISON:  None Available. FINDINGS: Gallbladder: No gallstones or wall thickening visualized. No sonographic Murphy sign noted by sonographer. Common bile duct: Diameter: 4.8 mm Liver: No focal lesion identified. Within normal limits in parenchymal echogenicity. Portal vein is patent on color Doppler imaging with normal direction of blood flow towards the liver. Other: None. IMPRESSION: Negative examination. Electronically Signed   By: Jasmine Pang M.D.   On: 07/06/2023 17:05    Procedures Procedures    Medications Ordered in ED Medications  sodium chloride 0.9 % bolus 500 mL ( Intravenous Stopped 07/06/23 1631)  ondansetron (ZOFRAN) injection 4 mg (4 mg Intravenous Given  07/06/23 1605)  sodium chloride 0.9 % bolus 1,000 mL (0 mLs Intravenous Stopped 07/06/23 1854)  erythromycin ophthalmic ointment ( Right Eye Given 07/06/23 1947)  sodium chloride 0.9 % bolus 1,000 mL (0 mLs Intravenous Stopped 07/06/23 2055)    ED Course/ Medical Decision Making/ A&P Clinical Course as of 07/06/23 2058  Wed Jul 06, 2023  1935 Stable  NVD and RUQ pain  [CC]  1935 Syncope in lobby.  Back to baseline here.  Likely orthostatic volume related treated with IV fluids in emergency room improving. [CC]    Clinical Course User Index [CC] Glyn Ade, MD                                 Medical Decision Making This patient presents to the ED for concern of abdominal pain, this involves an extensive number of treatment options, and is a complaint that carries with it a high risk of complications and morbidity.  The differential diagnosis includes cholecystitis, cholelithiasis, obstruction, ulcers, PNA, gastroenteritis  Also c/o syncope on arrival which carries a Ddx including the following: Dehydration, arrhythmia, neurologic event, anemia/blood loss   Co morbidities that complicate the patient evaluation  H/O appendicitis   Additional history obtained:  Additional history and/or information obtained from chart review, notable for Infrequent ED visits   Lab Tests:  I Ordered, and personally interpreted labs.  The pertinent results include:  LFT's unremarkable; no leukocytosis, normal hemoglobin; urine concentrated but without infection; Minimally decrease potassium of 3.3. Lipase mildly elevated at 66.  Imaging Studies ordered:  I ordered imaging studies including RUQ ultrasound Radiology interpretation is negative for gall stones or cholecystitis  Cardiac Monitoring:  The patient was maintained on a cardiac monitor.  I personally viewed and interpreted the cardiac monitored which showed an underlying rhythm of: NSR, rate 90   Medicines ordered and  prescription drug management:  I ordered medication including Zofran  for nausea Reevaluation of the patient after these medicines showed that the patient resolved I have reviewed the patients home medicines and have made adjustments as needed   Test Considered:  CT abd given no cause identified, however, no WBC, no fever, negative [place], no peritonitis.    Critical Interventions:  Nausea addressed with Zofran - no vomiting IV fluids being provided EKG for syncope negative for arrhythmia   Problem List / ED Course:  Patient c/o RUQ abdominal pain, vomiting, diarrhea. Syncope after arrival to ED. Likely dehydration given no PO intake, decreased urination.  Labs reassuring. IV fluids going.  Gall bladder US negative.    Reevaluation:  After the interventions noted above, I reevaluated the patient and found that they have :resolved   Social Determinants of Health:  Patient has own transportation   Disposition:  After consideration of the diagnostic results and the patients response to treatment, I feel that the patient would benefit from Feel the symptoms represent viral gastroenteritis with dehydration causing syncopal episode. No vomiting here. Tolerating PO's. She can be discharged home. Rx Zofran provided (she was given Phenergan by previous provider but is reluctant to take a sedating medication with small children at home in her care. Return precautions discussed.    Amount and/or Complexity of Data Reviewed Labs: ordered. Radiology: ordered.  Risk Prescription drug management.           Final Clinical Impression(s) / ED Diagnoses Final diagnoses:  Gastroenteritis  Dehydration    Rx / DC Orders ED Discharge Orders          Ordered    ondansetron (ZOFRAN) 4 MG tablet  Every 6 hours        07/06/23 2044              Elpidio Anis, PA-C 07/06/23 2058    Glyn Ade, MD 07/06/23 2313

## 2023-07-06 NOTE — ED Triage Notes (Signed)
Pt c/o N/V/D, RUQ abd pain, minimal PO intake since Sunday, syncope when walking into ED to register. Witnessed by registration who state pt did not hit her head.

## 2023-10-19 ENCOUNTER — Other Ambulatory Visit: Payer: Self-pay

## 2023-10-19 ENCOUNTER — Encounter (HOSPITAL_BASED_OUTPATIENT_CLINIC_OR_DEPARTMENT_OTHER): Payer: Self-pay | Admitting: Emergency Medicine

## 2023-10-19 ENCOUNTER — Emergency Department (HOSPITAL_BASED_OUTPATIENT_CLINIC_OR_DEPARTMENT_OTHER)
Admission: EM | Admit: 2023-10-19 | Discharge: 2023-10-19 | Disposition: A | Discharge status: Home / Self Care | Attending: Emergency Medicine | Admitting: Emergency Medicine

## 2023-10-19 ENCOUNTER — Emergency Department (HOSPITAL_BASED_OUTPATIENT_CLINIC_OR_DEPARTMENT_OTHER)

## 2023-10-19 DIAGNOSIS — M542 Cervicalgia: Secondary | ICD-10-CM | POA: Diagnosis present

## 2023-10-19 DIAGNOSIS — M5412 Radiculopathy, cervical region: Secondary | ICD-10-CM | POA: Diagnosis not present

## 2023-10-19 DIAGNOSIS — Z91040 Latex allergy status: Secondary | ICD-10-CM | POA: Diagnosis not present

## 2023-10-19 MED ORDER — KETOROLAC TROMETHAMINE 15 MG/ML IJ SOLN
15.0000 mg | Freq: Once | INTRAMUSCULAR | Status: AC
  Administered 2023-10-19: 15 mg via INTRAMUSCULAR
  Filled 2023-10-19: qty 1

## 2023-10-19 MED ORDER — GABAPENTIN 100 MG PO CAPS: 100.0000 mg | ORAL_CAPSULE | Freq: Two times a day (BID) | ORAL | 0 refills | Status: DC

## 2023-10-19 MED ORDER — PREDNISONE 50 MG PO TABS: ORAL_TABLET | ORAL | 0 refills | Status: DC

## 2023-10-19 MED ORDER — DEXAMETHASONE SODIUM PHOSPHATE 10 MG/ML IJ SOLN
10.0000 mg | Freq: Once | INTRAMUSCULAR | Status: AC
  Administered 2023-10-19: 10 mg via INTRAMUSCULAR
  Filled 2023-10-19: qty 1

## 2023-10-19 NOTE — ED Provider Notes (Cosign Needed Addendum)
 [facility] Provider Note   CSN: [id number] Arrival date & time: 10/19/23  1816     History  Chief Complaint  Patient presents with   Numbness    RIGHT ARM    Chelsea Day is a 28 y.o. female.  HPI Patient is a 28 year old female presents the ED today complaining of a 1 day history of right-sided neck pain that radiates down her arm to her fifth, fourth, third finger on her right hand.  States that she was at work when this began and has not noted any trauma to the area.  But did note that her hand did become "purpleish."  She did also endorse paresthesias down his right arm with the pain.  Denies trauma, vision changes, headache, weakness, vertigo.    Home Medications Prior to Admission medications   Medication Sig Start Date End Date Taking? Authorizing Provider  gabapentin (NEURONTIN) 100 MG capsule Take 1 capsule (100 mg total) by mouth 2 (two) times daily for 15 days. 10/19/23 11/03/23 Yes Lunette Stands, PA-C  predniSONE (DELTASONE) 50 MG tablet Take 1 tablet by mouth daily for 5 days 10/19/23  Yes Nechama Guard, [place] Ravens, PA-C  cetirizine (ZYRTEC) 10 MG chewable tablet Chew 10 mg by mouth daily.    [provider]  dicyclomine (BENTYL) 20 MG tablet Take 1 tablet (20 mg total) by mouth 4 (four) times daily as needed (abdominal cramping). 10/25/20   Molpus, John, MD  ibuprofen (ADVIL) 600 MG tablet Take 1 tablet (600 mg total) by mouth every 6 (six) hours. Patient taking differently: Take 600 mg by mouth every 6 (six) hours as needed for fever, headache or mild pain.  09/03/19   Geryl Rankins, MD  ondansetron (ZOFRAN) 4 MG tablet Take 1 tablet (4 mg total) by mouth every 6 (six) hours. 07/06/23   Elpidio Anis, PA-C  sertraline (ZOLOFT) 50 MG tablet Take 1 tablet (50 mg total) by mouth daily. Patient not taking: Reported on 06/12/2020 09/03/19 10/25/20  Geryl Rankins, MD      Allergies    Gadolinium derivatives, Iodinated contrast  media, Other, and Latex    Review of Systems   Review of Systems  Musculoskeletal:  Positive for neck pain.  All other systems reviewed and are negative.   Physical Exam Updated Vital Signs BP (!) 140/88 (BP Location: Left Arm)   Pulse (!) 102   Temp 98.8 F (37.1 C)   Resp 18   Ht 5\' 11"  (1.803 m)   Wt 120.2 kg   SpO2 100%   BMI 36.96 kg/m  Physical Exam Vitals and nursing note reviewed.  Constitutional:      General: She is not in acute distress.    Appearance: Normal appearance. She is not ill-appearing.  HENT:     Head: Normocephalic and atraumatic.  Eyes:     General: No scleral icterus.       Right eye: No discharge.        Left eye: No discharge.     Extraocular Movements: Extraocular movements intact.     Conjunctiva/sclera: Conjunctivae normal.  Neck:     Comments: Spurling test positive for right sided radiculopathy Cardiovascular:     Rate and Rhythm: Normal rate and regular rhythm.     Pulses: Normal pulses.     Heart sounds: Normal heart sounds. No murmur heard.    No friction rub. No gallop.  Pulmonary:     Effort: Pulmonary effort is normal. No  respiratory distress.     Breath sounds: Normal breath sounds. No stridor. No wheezing or rales.  Abdominal:     General: Abdomen is flat. There is no distension.     Palpations: Abdomen is soft.     Tenderness: There is no abdominal tenderness. There is no right CVA tenderness, left CVA tenderness or guarding.  Musculoskeletal:        General: No deformity.     Cervical back: Normal range of motion and neck supple. Tenderness (Tenderness noted on the right paraspinal muscles over the trapezius muscle.) present. No rigidity.     Right lower leg: No edema.     Left lower leg: No edema.  Skin:    General: Skin is warm and dry.     Coloration: Skin is not pale.     Findings: No bruising or erythema.  Neurological:     General: No focal deficit present.     Mental Status: She is alert and oriented to person,  place, and time. Mental status is at baseline.     Cranial Nerves: No cranial nerve deficit.     Sensory: No sensory deficit.     Motor: Weakness (Mild right hand grip strength weakness noted) present.     Coordination: Coordination normal.     Gait: Gait normal.  Psychiatric:        Mood and Affect: Mood normal.     ED Results / Procedures / Treatments   Labs (all labs ordered are listed, but only abnormal results are displayed) Labs Reviewed - No data to display  EKG None  Radiology US Venous Img Upper Uni Right(DVT) Result Date: 10/19/2023 CLINICAL DATA:  Right arm swelling EXAM: RIGHT UPPER EXTREMITY VENOUS DOPPLER ULTRASOUND TECHNIQUE: Gray-scale sonography with graded compression, as well as color Doppler and duplex ultrasound were performed to evaluate the upper extremity deep venous system from the level of the subclavian vein and including the jugular, axillary, basilic, radial, ulnar and upper cephalic vein. Spectral Doppler was utilized to evaluate flow at rest and with distal augmentation maneuvers. COMPARISON:  None Available. FINDINGS: Contralateral Subclavian Vein: Respiratory phasicity is normal and symmetric with the symptomatic side. No evidence of thrombus. Normal compressibility. Internal Jugular Vein: No evidence of thrombus. Normal compressibility, respiratory phasicity and response to augmentation. Subclavian Vein: No evidence of thrombus. Normal compressibility, respiratory phasicity and response to augmentation. Axillary Vein: No evidence of thrombus. Normal compressibility, respiratory phasicity and response to augmentation. Cephalic Vein: No evidence of thrombus. Normal compressibility, respiratory phasicity and response to augmentation. Basilic Vein: No evidence of thrombus. Normal compressibility, respiratory phasicity and response to augmentation. Brachial Veins: No evidence of thrombus. Normal compressibility, respiratory phasicity and response to augmentation.  Radial Veins: No evidence of thrombus. Normal compressibility, respiratory phasicity and response to augmentation. Ulnar Veins: No evidence of thrombus. Normal compressibility, respiratory phasicity and response to augmentation. Venous Reflux:  None visualized. Other Findings:  None visualized. IMPRESSION: No evidence of DVT within the right upper extremity. Electronically Signed   By: Charlett Nose M.D.   On: 10/19/2023 19:19    Procedures Procedures    Medications Ordered in ED Medications  ketorolac (TORADOL) 15 MG/ML injection 15 mg (15 mg Intramuscular Given 10/19/23 2147)  dexamethasone (DECADRON) injection 10 mg (10 mg Intramuscular Given 10/19/23 2144)    ED Course/ Medical Decision Making/ A&P  Medical Decision Making Risk Prescription drug management.   Patient is a 28 year old female presents the ED today complaining of a 1 day history of right-sided neck pain that radiates down her arm to her fifth, fourth, third finger on her right hand.  States that she was at work when this began and has not noted any trauma to the area.  But did note that her hand did become "purpleish."  She did also endorse paresthesias down his right arm with the pain.  Manage patient seems to be going down the right arm posteriorly down to fifth, fourth, third finger.  On physical exam, patient is noted to be afebrile, no acute distress, speaking full sentences.  Patient was noted to be especially tender with Spurling test.  It was noted to have mildly decreased grip strength on right side when compared to left.  Normal sensation on both sides.  Normal arm flexion extension on both sides.  Radial pulses present 2+ with normal cap refill bilaterally.  Patient also mildly tender underneath right clavicle.  Exam is otherwise unremarkable with no skin lesions, rashes noted.  Discussed with patient that she would ultimately need an MRI as her DVT study was negative.  Considered  subclavian steal syndrome however symptoms more congruent with radiculopathy.  Discussed CT as an option however patient decided to wait for MRI in outpatient setting due to not having MRI here in the hospital.  Patient vital signs remained stable without the course of her time here.  I have low suspicion for any emergent pathology present this time however will prescribe gabapentin as well as prednisone outpatient and provided Decadron and Toradol in the ER for pain relief.  On reevaluation, patient states the pain has subsided a little but still present.  Recommend that she follow-up with primary care to have an MRI done as well as provided neurosurgery's information to have her call the clinic and schedule a visit.  I have low suspicion for any emergent process present at this time.  I believe patient safe to be discharged and followed up in outpatient setting.  Provided strict return to ED precautions.  Patient was agreement understanding of plan.  All questions were answered.    Differential diagnoses prior to evaluation: The emergent differential diagnosis includes, but is not limited to, DVT, subclavian steal syndrome, radiculopathy, CVA,. This is not an exhaustive differential.   Past Medical History / Co-morbidities / Social History: Bilateral ovarian cysts, irregular periods, endometriosis  Additional history: Chart reviewed. Pertinent results include: Patient has no clot history or radiculopathy when looking through her chart.  Lab Tests/Imaging studies: I personally interpreted labs/imaging and the pertinent results include:   DVT study negative I agree with the radiologist interpretation   Medications: I ordered medication including Toradol and Decadron.  I have reviewed the patients home medicines and have made adjustments as needed.   Disposition: After consideration of the diagnostic results and the patients response to treatment, I feel that the patient met from discharge and  treatment as above.   emergency department workup does not suggest an emergent condition requiring admission or immediate intervention beyond what has been performed at this time. The plan is: Gabapentin and prednisone for pain, follow-up with PCP for MRI, return for new or worsening symptoms. The patient is safe for discharge and has been instructed to return immediately for worsening symptoms, change in symptoms or any other concerns.   Final Clinical Impression(s) / ED Diagnoses Final diagnoses:  Cervical radiculopathy  Rx / DC Orders ED Discharge Orders          Ordered    gabapentin (NEURONTIN) 100 MG capsule  2 times daily        10/19/23 2131    predniSONE (DELTASONE) 50 MG tablet        10/19/23 2132          Portions of this report may have been transcribed using voice recognition software. Every effort was made to ensure accuracy; however, inadvertent computerized transcription errors may be present.     Lunette Stands, PA-C 10/19/23 2300    Lunette Stands, PA-C 10/19/23 2300    Edwin Dada P, DO 10/20/23 [phone]

## 2023-10-19 NOTE — ED Notes (Signed)
 Patient transported to Ultrasound

## 2023-10-19 NOTE — ED Notes (Signed)
 Pt. Tearful about R arm pain and states the R elbow has been causing her pain for several months.  Pt. Reports this morning she woke and had pain from the elbow to her fingers.  Pt. Has reports of discoloration and also reports of edema in the R lower arm and R hand.  Pt. Reports she feels like the R arm is swollen.  Pt. States she if dropping things with the R hand

## 2023-10-19 NOTE — ED Triage Notes (Signed)
 Patient c/o right arm numbness that extends from right elbow to right hand and right arm pain that extends up into right shoulder. Ensores right hand swelling and right arm "turning purple" intermittently. Sx started today but reports h/o "chronic problem" with right elbow.   Upon assessment, right radial pulse intact. Decreased grip strength on right side. Decreased sensation on right side. Increased capillary refill on right side. Color normal. Mild swelling. Skin temp normal.

## 2023-10-19 NOTE — Discharge Instructions (Signed)
 You were seen today for right sided neck pain rating down to your right arm.  I suspect this is most likely a radiculopathy, relating to the nerves causing this pain.  I am prescribing you gabapentin as well as providing a steroid if you take at home.  Also providing a steroid here in clinic with a anti-inflammatory to help decrease the pain.  I believe that you will need a follow-up MRI for further evaluation.  I do not believe that any other emergent conditions present at this time.  However will recommend that he have that evaluated.

## 2023-10-27 ENCOUNTER — Other Ambulatory Visit: Payer: Self-pay | Admitting: Neurosurgery

## 2023-10-27 DIAGNOSIS — M25521 Pain in right elbow: Secondary | ICD-10-CM

## 2023-10-28 ENCOUNTER — Other Ambulatory Visit (HOSPITAL_COMMUNITY): Payer: Self-pay | Admitting: Family Medicine

## 2023-10-28 DIAGNOSIS — R599 Enlarged lymph nodes, unspecified: Secondary | ICD-10-CM

## 2023-10-31 ENCOUNTER — Ambulatory Visit (HOSPITAL_BASED_OUTPATIENT_CLINIC_OR_DEPARTMENT_OTHER)
Admission: RE | Admit: 2023-10-31 | Discharge: 2023-10-31 | Disposition: A | Discharge status: Home / Self Care | Source: Ambulatory Visit | Attending: Family Medicine | Admitting: Family Medicine

## 2023-10-31 DIAGNOSIS — R599 Enlarged lymph nodes, unspecified: Secondary | ICD-10-CM | POA: Insufficient documentation

## 2023-10-31 MED ORDER — IOHEXOL 300 MG/ML  SOLN
100.0000 mL | Freq: Once | INTRAMUSCULAR | Status: AC | PRN
  Administered 2023-10-31: 75 mL via INTRAVENOUS

## 2023-11-01 ENCOUNTER — Ambulatory Visit (HOSPITAL_BASED_OUTPATIENT_CLINIC_OR_DEPARTMENT_OTHER)
Admission: RE | Admit: 2023-11-01 | Discharge: 2023-11-01 | Disposition: A | Discharge status: Home / Self Care | Source: Ambulatory Visit | Attending: Family Medicine | Admitting: Family Medicine

## 2023-11-01 ENCOUNTER — Other Ambulatory Visit (HOSPITAL_COMMUNITY): Payer: Self-pay | Admitting: Family Medicine

## 2023-11-01 DIAGNOSIS — R079 Chest pain, unspecified: Secondary | ICD-10-CM | POA: Diagnosis present

## 2023-11-01 DIAGNOSIS — R0789 Other chest pain: Secondary | ICD-10-CM | POA: Diagnosis present

## 2023-11-01 DIAGNOSIS — R5383 Other fatigue: Secondary | ICD-10-CM | POA: Diagnosis present

## 2023-11-01 DIAGNOSIS — D72828 Other elevated white blood cell count: Secondary | ICD-10-CM

## 2023-11-02 ENCOUNTER — Encounter: Payer: Self-pay | Admitting: Medical Oncology

## 2023-11-02 NOTE — Progress Notes (Unsigned)
 [facility] [phone]   [phone]  INITIAL CONSULTATION:  Patient Care Team: [name], PA-C as PCP - General (Family Medicine)  CHIEF COMPLAINTS/PURPOSE OF CONSULTATION:  "lymphadenopathy"  HISTORY OF PRESENTING ILLNESS:  Chelsea Day 28 y.o. female with medical history significant for preeclampsia, hypertension, anxiety, right salpingo oophorectomy.  On review of the previous records patient is being referred by PCP [name], PA-C.  Patient presented to the emergency room on 10/19/2023 for acute onset of right arm numbness.   acute right sided neck pain with numbness and tingling down her right arm down to her 3rd, 4th and 5th digit.     DVT study was negative. EDP's note includes concern for cervical radiculopathy and recommend outpatient MRI.  Patient was started on gabapentin and prednisone.  MRI of cervical spine performed on 10/22/2023 revealed mildly enlarged bilateral cervical lymph nodes and mild asymmetric enlargement of the right palatine tonsil.  CT soft tissue neck was performed on 10/31/2023 and the result shows symmetric rate enlarged and nonspecific bilateral level 2A nodes, 15 mm short axis and 2 to 3 cm long axis. Radiologist also commented on mild postinflammatory calcifications of the left palatine tonsil. CT chest was performed 11/01/23 for chest pressure and was negative.    along with XR done by her chiropractor and orthopedic ahead of her ER visit.    Of note, pt was ref to hematology for elev WBCs, sweats but has not followed up for bone marrow testing ?mast cell disease due to cost. Today patient states that she has been having waking up sweating every night for the past week. She also endoreses weight loss, recurrent flushing over her chest, rashes and easy bruising. Of note, she has a family history of hodgkins lymphoma in her Father.  For her arm pain she states that nothing  makes the pain better but placing arm over head does alleviate tingilng which is felt most in 3rd-4th digits and medial aspect of UE. She states that it is worse throught the day as using it but wakes up with it. She describes that the pain tingling, and cold that is felt most across her anterior chest with acompanying neck pain which is what prompter her to go to the ER. Any movements seem excruciating. Tingling now in both hands, flushing of skin. She is more fatigued now. No fever, chills. WBC elev last check but on prednisone. Awaiting CT of neck for evaluation.    On exam today ***  . She reports that her medical history significant for MS in her mother and colon cancer in her maternal grandmother as well as colon cancer in her maternal aunt.   MEDICAL HISTORY:  Past Medical History:  Diagnosis Date   Bilateral ovarian cysts    Endometriosis determined by laparoscopy    History of postpartum uterine bleeding    Irregular periods/menstrual cycles    Pregnancy induced hypertension     SURGICAL HISTORY: Past Surgical History:  Procedure Laterality Date   LAPAROSCOPIC OVARIAN CYSTECTOMY Right 01/24/2015   Procedure: LAPAROSCOPIC LYSIS OF ADHESION, LAPROSCOPIC RIGHT SALPINGOOOPHORECTOMY, BIOPSY OF RIGHT BOARD LIGAMENT; LYSIS BIOPSY OF ADHESIONS, BIOPSY OF LEFT UTERO LIGAMENT;  Surgeon: Kirkland Hun, MD;  Location: [facility];  Service: Gynecology;  Laterality: Right;   LAPAROSCOPY  11/08/2011   Procedure: LAPAROSCOPY OPERATIVE;  Surgeon: Levi Aland, MD;  Location: [facility];  Service: Gynecology;  Laterality: Right;   OVARIAN CYST REMOVAL  11/08/2011   Procedure: OVARIAN  CYSTECTOMY;  Surgeon: Levi Aland, MD;  Location: [facility];  Service: Gynecology;  Laterality: Right;   UNILATERAL SALPINGECTOMY Right 01/24/15    SOCIAL HISTORY: Social History   Socioeconomic History   Marital status: Married    Spouse name: Not on file   Number of children: Not on file   Years of education: Not on  file   Highest education level: Not on file  Occupational History   Not on file  Tobacco Use   Smoking status: Former    Current packs/day: 0.00    Types: Cigarettes    Quit date: 11/30/2013    Years since quitting: 9.9   Smokeless tobacco: Never  Vaping Use   Vaping status: Never Used  Substance and Sexual Activity   Alcohol use: No    Alcohol/week: 0.0 standard drinks of alcohol    Comment: social, not while pregnant   Drug use: No   Sexual activity: Yes  Other Topics Concern   Not on file  Social History Narrative   Not on file   Social Drivers of Health   Financial Resource Strain: Not on file  Food Insecurity: Not on file  Transportation Needs: Not on file  Physical Activity: Not on file  Stress: Not on file  Social Connections: Unknown (12/14/2021)   Received from [facility], [facility]   Social Network    Social Network: Not on file  Intimate Partner Violence: Unknown (11/04/2021)   Received from [facility], [facility]   HITS    Physically Hurt: Not on file    Insult or Talk Down To: Not on file    Threaten Physical Harm: Not on file    Scream or Curse: Not on file    FAMILY HISTORY: Family History  Problem Relation Age of Onset   Cancer Father        ???   Multiple sclerosis Mother    Colon cancer Maternal Grandmother    Colon cancer Maternal Aunt    Allergic rhinitis Neg Hx    Angioedema Neg Hx    Asthma Neg Hx    Eczema Neg Hx    Immunodeficiency Neg Hx    Urticaria Neg Hx     ALLERGIES:  is allergic to gadolinium derivatives, iodinated contrast media, other, and latex.  MEDICATIONS:  Current Outpatient Medications  Medication Sig Dispense Refill   cetirizine (ZYRTEC) 10 MG chewable tablet Chew 10 mg by mouth daily.     dicyclomine (BENTYL) 20 MG tablet Take 1 tablet (20 mg total) by mouth 4 (four) times daily as needed (abdominal cramping). 20 tablet 0   gabapentin (NEURONTIN) 100 MG capsule Take 1 capsule (100 mg total) by mouth 2  (two) times daily for 15 days. 30 capsule 0   ibuprofen (ADVIL) 600 MG tablet Take 1 tablet (600 mg total) by mouth every 6 (six) hours. (Patient taking differently: Take 600 mg by mouth every 6 (six) hours as needed for fever, headache or mild pain. ) 30 tablet 0   ondansetron (ZOFRAN) 4 MG tablet Take 1 tablet (4 mg total) by mouth every 6 (six) hours. 12 tablet 0   predniSONE (DELTASONE) 50 MG tablet Take 1 tablet by mouth daily for 5 days 5 tablet 0   No current facility-administered medications for this visit.    REVIEW OF SYSTEMS:   All other systems are reviewed and are negative for acute change except as noted in the HPI.  PHYSICAL EXAMINATION: ECOG PERFORMANCE STATUS: {CHL ONC  ECOG ZO:[id number]}  There were no vitals filed for this visit. There were no vitals filed for this visit.  Physical Exam    LABORATORY DATA:  I have reviewed the data as listed    Latest Ref Rng & Units 07/06/2023    3:01 PM 12/15/2020    2:33 PM 10/24/2020   11:01 PM  CBC  WBC 4.0 - 10.5 K/uL 6.5  11.5  10.6   Hemoglobin 12.0 - 15.0 g/dL 81.1  91.4  78.2   Hematocrit 36.0 - 46.0 % 41.8  38.2  38.5   Platelets 150 - 400 K/uL 306  353  306        Latest Ref Rng & Units 07/06/2023    3:01 PM 12/15/2020    2:33 PM 10/24/2020   11:01 PM  CMP  Glucose 70 - 99 mg/dL 956  213  086   BUN 6 - 20 mg/dL 9  9  6    Creatinine 0.44 - 1.00 mg/dL 5.78  4.69  6.29   Sodium 135 - 145 mmol/L 141  140  137   Potassium 3.5 - 5.1 mmol/L 3.3  3.8  3.2   Chloride 98 - 111 mmol/L 105  109  107   CO2 22 - 32 mmol/L 26  24  21    Calcium 8.9 - 10.3 mg/dL 9.7  9.2  8.7   Total Protein 6.5 - 8.1 g/dL 7.9  7.6  7.6   Total Bilirubin <1.2 mg/dL 0.5  0.3  0.2   Alkaline Phos 38 - 126 U/L 55  67  67   AST 15 - 41 U/L 22  16  26    ALT 0 - 44 U/L 29  21  33      RADIOGRAPHIC STUDIES: I have personally reviewed the radiological images as listed and agreed with the findings in the report. CT SOFT TISSUE NECK W  CONTRAST Result Date: 11/02/2023 CLINICAL DATA:  28 year old female "right greater than left swollen lymph nodes bilateral neck". EXAM: CT NECK WITH CONTRAST TECHNIQUE: Multidetector CT imaging of the neck was performed using the standard protocol following the bolus administration of intravenous contrast. RADIATION DOSE REDUCTION: This exam was performed according to the departmental dose-optimization program which includes automated exposure control, adjustment of the mA and/or kV according to patient size and/or use of iterative reconstruction technique. CONTRAST:  75mL OMNIPAQUE IOHEXOL 300 MG/ML  SOLN COMPARISON:  Chest CT on 11/01/2023 reported separately. PET-CT 06/30/2020 FINDINGS: Pharynx and larynx: Larynx and pharynx soft tissue contours are within normal limits. There are mild postinflammatory calcifications of the left palatine tonsil. Parapharyngeal and retropharyngeal spaces are negative. Salivary glands: Negative sublingual space. Submandibular and parotid glands are within normal limits. Thyroid: Negative. Lymph nodes: Lymph Enlarged right level 2A lymph node, 15 mm short axis, approximately 23 mm long axis on series 2, image 48 and sagittal image 45. Slightly smaller contralateral left level 2A lymph node on series 2, image 47 and sagittal image 87, 12-15 mm short axis. But other bilateral level 2 lymph nodes are normal, 6-7 mm short axis. No cystic or necrotic lymph node. Bilateral level 1, 3 through 5 nodes are within normal limits. Vascular: Major vascular structures in the bilateral neck and at the skull base are enhancing and appear to be patent. Left IJ appears dominant. Right vertebral artery appears dominant. Limited intracranial: Negative. Visualized orbits: Negative. Mastoids and visualized paranasal sinuses: Clear bilaterally. Skeleton: No osseous abnormality identified. Upper chest:  Negative.  See also chest  CT performed following day. IMPRESSION: Symmetrically enlarged and nonspecific  bilateral level 2A nodes, 15 mm short axis and 2-3 cm long axis. Otherwise negative CT appearance of the bilateral neck. Recommend follow-up by clinical exam, with repeat Neck CT (IV contrast preferred) if any area further enlarges or becomes painful. Electronically Signed   By: Odessa Fleming M.D.   On: 11/02/2023 08:54   CT CHEST WO CONTRAST Result Date: 11/01/2023 CLINICAL DATA:  Chest pain and pressure, swollen lymph nodes EXAM: CT CHEST WITHOUT CONTRAST TECHNIQUE: Multidetector CT imaging of the chest was performed following the standard protocol without IV contrast. RADIATION DOSE REDUCTION: This exam was performed according to the departmental dose-optimization program which includes automated exposure control, adjustment of the mA and/or kV according to patient size and/or use of iterative reconstruction technique. COMPARISON:  CT abdomen 10/25/2020 FINDINGS: Cardiovascular: Heart size normal. No pericardial effusion. Central great vessels normal in caliber. Mediastinum/Nodes: No mediastinal hematoma, mass, or adenopathy. Lungs/Pleura: No pleural effusion. No pneumothorax. Lungs are clear. Upper Abdomen: No acute abnormality. Musculoskeletal: Minimal spurring in the lower thoracic spine. No acute findings. IMPRESSION: Negative Electronically Signed   By: Corlis Leak M.D.   On: 11/01/2023 17:51   US Venous Img Upper Uni Right(DVT) Result Date: 10/19/2023 CLINICAL DATA:  Right arm swelling EXAM: RIGHT UPPER EXTREMITY VENOUS DOPPLER ULTRASOUND TECHNIQUE: Gray-scale sonography with graded compression, as well as color Doppler and duplex ultrasound were performed to evaluate the upper extremity deep venous system from the level of the subclavian vein and including the jugular, axillary, basilic, radial, ulnar and upper cephalic vein. Spectral Doppler was utilized to evaluate flow at rest and with distal augmentation maneuvers. COMPARISON:  None Available. FINDINGS: Contralateral Subclavian Vein: Respiratory  phasicity is normal and symmetric with the symptomatic side. No evidence of thrombus. Normal compressibility. Internal Jugular Vein: No evidence of thrombus. Normal compressibility, respiratory phasicity and response to augmentation. Subclavian Vein: No evidence of thrombus. Normal compressibility, respiratory phasicity and response to augmentation. Axillary Vein: No evidence of thrombus. Normal compressibility, respiratory phasicity and response to augmentation. Cephalic Vein: No evidence of thrombus. Normal compressibility, respiratory phasicity and response to augmentation. Basilic Vein: No evidence of thrombus. Normal compressibility, respiratory phasicity and response to augmentation. Brachial Veins: No evidence of thrombus. Normal compressibility, respiratory phasicity and response to augmentation. Radial Veins: No evidence of thrombus. Normal compressibility, respiratory phasicity and response to augmentation. Ulnar Veins: No evidence of thrombus. Normal compressibility, respiratory phasicity and response to augmentation. Venous Reflux:  None visualized. Other Findings:  None visualized. IMPRESSION: No evidence of DVT within the right upper extremity. Electronically Signed   By: Charlett Nose M.D.   On: 10/19/2023 19:19    ASSESSMENT & PLAN Chelsea Day is a 28 y.o. female presenting [facility] for consultation regarding lymphadenopathy. We have reviewed etiologies including infectious process, inflammatory process, malignancy, or lymphoproliferative disorder. Patient will proceed with laboratory workup today.   #  #Age related screenings - UTD with pap smear. Otherwise N/A based on age  -Patient will RTC when work up is complete.  Patient expressed understanding of the recommended workup and is agreeable to move forward.   All questions were answered. The patient knows to call the clinic with any problems, questions or concerns.  Shared visit with Dr. Marland Kitchen  No orders  of the defined types were placed in this encounter.     I have spent a total of {CHL ONC TIME VISIT - ZOXWR:[id number]} minutes of face-to-face and  non-face-to-face time, preparing to see the patient, obtaining and/or reviewing separately obtained history, performing a medically appropriate examination, counseling and educating the patient, ordering medications/tests/procedures, referring and communicating with other health care professionals, documenting clinical information in the electronic health record, independently interpreting results and communicating results to the patient, and care coordination.   Namon Cirri PA-C Department of Hematology/Oncology [facility] Phone: [phone]

## 2023-11-02 NOTE — Progress Notes (Deleted)
 [facility] [phone]   [phone]  INITIAL CONSULTATION:  Patient Care Team: [name], PA-C as PCP - General (Family Medicine)  CHIEF COMPLAINTS/PURPOSE OF CONSULTATION:  "lymphadenopathy"  HISTORY OF PRESENTING ILLNESS:  Chelsea Day 28 y.o. female with medical history significant for preeclampsia, hypertension, anxiety, right salpingo oophorectomy.  On review of the previous records patient is being referred by PCP [name], PA-C.  Patient presented to the emergency room on 10/19/2023 for acute onset of right arm numbness.   acute right sided neck pain with numbness and tingling down her right arm down to her 3rd, 4th and 5th digit.     DVT study was negative. EDP's note includes concern for cervical radiculopathy and recommend outpatient MRI.  Patient was started on gabapentin and prednisone.  MRI of cervical spine performed on 10/22/2023 revealed mildly enlarged bilateral cervical lymph nodes and mild asymmetric enlargement of the right palatine tonsil.  CT soft tissue neck was performed on 10/31/2023 and the result shows symmetric rate enlarged and nonspecific bilateral level 2A nodes, 15 mm short axis and 2 to 3 cm long axis. Radiologist also commented on mild postinflammatory calcifications of the left palatine tonsil. CT chest was performed 11/01/23 for chest pressure and was negative.    along with XR done by her chiropractor and orthopedic ahead of her ER visit.    Of note, pt was ref to hematology for elev WBCs, sweats but has not followed up for bone marrow testing ?mast cell disease due to cost. Today patient states that she has been having waking up sweating every night for the past week. She also endoreses weight loss, recurrent flushing over her chest, rashes and easy bruising. Of note, she has a family history of hodgkins lymphoma in her Father.  For her arm pain she states that nothing  makes the pain better but placing arm over head does alleviate tingilng which is felt most in 3rd-4th digits and medial aspect of UE. She states that it is worse throught the day as using it but wakes up with it. She describes that the pain tingling, and cold that is felt most across her anterior chest with acompanying neck pain which is what prompter her to go to the ER. Any movements seem excruciating. Tingling now in both hands, flushing of skin. She is more fatigued now. No fever, chills. WBC elev last check but on prednisone. Awaiting CT of neck for evaluation.    On exam today ***  . She reports that her medical history significant for MS in her mother and colon cancer in her maternal grandmother as well as colon cancer in her maternal aunt.   MEDICAL HISTORY:  Past Medical History:  Diagnosis Date  . Bilateral ovarian cysts   . Endometriosis determined by laparoscopy   . History of postpartum uterine bleeding   . Irregular periods/menstrual cycles   . Pregnancy induced hypertension     SURGICAL HISTORY: Past Surgical History:  Procedure Laterality Date  . LAPAROSCOPIC OVARIAN CYSTECTOMY Right 01/24/2015   Procedure: LAPAROSCOPIC LYSIS OF ADHESION, LAPROSCOPIC RIGHT SALPINGOOOPHORECTOMY, BIOPSY OF RIGHT BOARD LIGAMENT; LYSIS BIOPSY OF ADHESIONS, BIOPSY OF LEFT UTERO LIGAMENT;  Surgeon: Kirkland Hun, MD;  Location: [facility];  Service: Gynecology;  Laterality: Right;  . LAPAROSCOPY  11/08/2011   Procedure: LAPAROSCOPY OPERATIVE;  Surgeon: Levi Aland, MD;  Location: [facility];  Service: Gynecology;  Laterality: Right;  . OVARIAN CYST REMOVAL  11/08/2011   Procedure: OVARIAN  CYSTECTOMY;  Surgeon: Levi Aland, MD;  Location: [facility];  Service: Gynecology;  Laterality: Right;  . UNILATERAL SALPINGECTOMY Right 01/24/15    SOCIAL HISTORY: Social History   Socioeconomic History  . Marital status: Married    Spouse name: Not on file  . Number of children: Not on file  . Years of  education: Not on file  . Highest education level: Not on file  Occupational History  . Not on file  Tobacco Use  . Smoking status: Former    Current packs/day: 0.00    Types: Cigarettes    Quit date: 11/30/2013    Years since quitting: 9.9  . Smokeless tobacco: Never  Vaping Use  . Vaping status: Never Used  Substance and Sexual Activity  . Alcohol use: No    Alcohol/week: 0.0 standard drinks of alcohol    Comment: social, not while pregnant  . Drug use: No  . Sexual activity: Yes  Other Topics Concern  . Not on file  Social History Narrative  . Not on file   Social Drivers of Health   Financial Resource Strain: Not on file  Food Insecurity: Not on file  Transportation Needs: Not on file  Physical Activity: Not on file  Stress: Not on file  Social Connections: Unknown (12/14/2021)   Received from [facility], [facility]   Social Network   . Social Network: Not on file  Intimate Partner Violence: Unknown (11/04/2021)   Received from [facility]   HITS   . Physically Hurt: Not on file   . Insult or Talk Down To: Not on file   . Threaten Physical Harm: Not on file   . Scream or Curse: Not on file    FAMILY HISTORY: Family History  Problem Relation Age of Onset  . Cancer Father        ???  . Multiple sclerosis Mother   . Colon cancer Maternal Grandmother   . Colon cancer Maternal Aunt   . Allergic rhinitis Neg Hx   . Angioedema Neg Hx   . Asthma Neg Hx   . Eczema Neg Hx   . Immunodeficiency Neg Hx   . Urticaria Neg Hx     ALLERGIES:  is allergic to gadolinium derivatives, iodinated contrast media, other, and latex.  MEDICATIONS:  Current Outpatient Medications  Medication Sig Dispense Refill  . cetirizine (ZYRTEC) 10 MG chewable tablet Chew 10 mg by mouth daily.    Marland Kitchen dicyclomine (BENTYL) 20 MG tablet Take 1 tablet (20 mg total) by mouth 4 (four) times daily as needed (abdominal cramping). 20 tablet 0  . gabapentin (NEURONTIN) 100 MG  capsule Take 1 capsule (100 mg total) by mouth 2 (two) times daily for 15 days. 30 capsule 0  . ibuprofen (ADVIL) 600 MG tablet Take 1 tablet (600 mg total) by mouth every 6 (six) hours. (Patient taking differently: Take 600 mg by mouth every 6 (six) hours as needed for fever, headache or mild pain. ) 30 tablet 0  . ondansetron (ZOFRAN) 4 MG tablet Take 1 tablet (4 mg total) by mouth every 6 (six) hours. 12 tablet 0  . predniSONE (DELTASONE) 50 MG tablet Take 1 tablet by mouth daily for 5 days 5 tablet 0   No current facility-administered medications for this visit.    REVIEW OF SYSTEMS:   All other systems are reviewed and are negative for acute change except as noted in the HPI.  PHYSICAL EXAMINATION: ECOG PERFORMANCE STATUS: {CHL ONC  ECOG ZO:[id number]}  There were no vitals filed for this visit. There were no vitals filed for this visit.  Physical Exam    LABORATORY DATA:  I have reviewed the data as listed    Latest Ref Rng & Units 07/06/2023    3:01 PM 12/15/2020    2:33 PM 10/24/2020   11:01 PM  CBC  WBC 4.0 - 10.5 K/uL 6.5  11.5  10.6   Hemoglobin 12.0 - 15.0 g/dL 81.1  91.4  78.2   Hematocrit 36.0 - 46.0 % 41.8  38.2  38.5   Platelets 150 - 400 K/uL 306  353  306        Latest Ref Rng & Units 07/06/2023    3:01 PM 12/15/2020    2:33 PM 10/24/2020   11:01 PM  CMP  Glucose 70 - 99 mg/dL 956  213  086   BUN 6 - 20 mg/dL 9  9  6    Creatinine 0.44 - 1.00 mg/dL 5.78  4.69  6.29   Sodium 135 - 145 mmol/L 141  140  137   Potassium 3.5 - 5.1 mmol/L 3.3  3.8  3.2   Chloride 98 - 111 mmol/L 105  109  107   CO2 22 - 32 mmol/L 26  24  21    Calcium 8.9 - 10.3 mg/dL 9.7  9.2  8.7   Total Protein 6.5 - 8.1 g/dL 7.9  7.6  7.6   Total Bilirubin <1.2 mg/dL 0.5  0.3  0.2   Alkaline Phos 38 - 126 U/L 55  67  67   AST 15 - 41 U/L 22  16  26    ALT 0 - 44 U/L 29  21  33      RADIOGRAPHIC STUDIES: I have personally reviewed the radiological images as listed and agreed with the  findings in the report. CT SOFT TISSUE NECK W CONTRAST Result Date: 11/02/2023 CLINICAL DATA:  28 year old female "right greater than left swollen lymph nodes bilateral neck". EXAM: CT NECK WITH CONTRAST TECHNIQUE: Multidetector CT imaging of the neck was performed using the standard protocol following the bolus administration of intravenous contrast. RADIATION DOSE REDUCTION: This exam was performed according to the departmental dose-optimization program which includes automated exposure control, adjustment of the mA and/or kV according to patient size and/or use of iterative reconstruction technique. CONTRAST:  75mL OMNIPAQUE IOHEXOL 300 MG/ML  SOLN COMPARISON:  Chest CT on 11/01/2023 reported separately. PET-CT 06/30/2020 FINDINGS: Pharynx and larynx: Larynx and pharynx soft tissue contours are within normal limits. There are mild postinflammatory calcifications of the left palatine tonsil. Parapharyngeal and retropharyngeal spaces are negative. Salivary glands: Negative sublingual space. Submandibular and parotid glands are within normal limits. Thyroid: Negative. Lymph nodes: Lymph Enlarged right level 2A lymph node, 15 mm short axis, approximately 23 mm long axis on series 2, image 48 and sagittal image 45. Slightly smaller contralateral left level 2A lymph node on series 2, image 47 and sagittal image 87, 12-15 mm short axis. But other bilateral level 2 lymph nodes are normal, 6-7 mm short axis. No cystic or necrotic lymph node. Bilateral level 1, 3 through 5 nodes are within normal limits. Vascular: Major vascular structures in the bilateral neck and at the skull base are enhancing and appear to be patent. Left IJ appears dominant. Right vertebral artery appears dominant. Limited intracranial: Negative. Visualized orbits: Negative. Mastoids and visualized paranasal sinuses: Clear bilaterally. Skeleton: No osseous abnormality identified. Upper chest:  Negative.  See also chest  CT performed following day.  IMPRESSION: Symmetrically enlarged and nonspecific bilateral level 2A nodes, 15 mm short axis and 2-3 cm long axis. Otherwise negative CT appearance of the bilateral neck. Recommend follow-up by clinical exam, with repeat Neck CT (IV contrast preferred) if any area further enlarges or becomes painful. Electronically Signed   By: Odessa Fleming M.D.   On: 11/02/2023 08:54   CT CHEST WO CONTRAST Result Date: 11/01/2023 CLINICAL DATA:  Chest pain and pressure, swollen lymph nodes EXAM: CT CHEST WITHOUT CONTRAST TECHNIQUE: Multidetector CT imaging of the chest was performed following the standard protocol without IV contrast. RADIATION DOSE REDUCTION: This exam was performed according to the departmental dose-optimization program which includes automated exposure control, adjustment of the mA and/or kV according to patient size and/or use of iterative reconstruction technique. COMPARISON:  CT abdomen 10/25/2020 FINDINGS: Cardiovascular: Heart size normal. No pericardial effusion. Central great vessels normal in caliber. Mediastinum/Nodes: No mediastinal hematoma, mass, or adenopathy. Lungs/Pleura: No pleural effusion. No pneumothorax. Lungs are clear. Upper Abdomen: No acute abnormality. Musculoskeletal: Minimal spurring in the lower thoracic spine. No acute findings. IMPRESSION: Negative Electronically Signed   By: Corlis Leak M.D.   On: 11/01/2023 17:51   US Venous Img Upper Uni Right(DVT) Result Date: 10/19/2023 CLINICAL DATA:  Right arm swelling EXAM: RIGHT UPPER EXTREMITY VENOUS DOPPLER ULTRASOUND TECHNIQUE: Gray-scale sonography with graded compression, as well as color Doppler and duplex ultrasound were performed to evaluate the upper extremity deep venous system from the level of the subclavian vein and including the jugular, axillary, basilic, radial, ulnar and upper cephalic vein. Spectral Doppler was utilized to evaluate flow at rest and with distal augmentation maneuvers. COMPARISON:  None Available. FINDINGS:  Contralateral Subclavian Vein: Respiratory phasicity is normal and symmetric with the symptomatic side. No evidence of thrombus. Normal compressibility. Internal Jugular Vein: No evidence of thrombus. Normal compressibility, respiratory phasicity and response to augmentation. Subclavian Vein: No evidence of thrombus. Normal compressibility, respiratory phasicity and response to augmentation. Axillary Vein: No evidence of thrombus. Normal compressibility, respiratory phasicity and response to augmentation. Cephalic Vein: No evidence of thrombus. Normal compressibility, respiratory phasicity and response to augmentation. Basilic Vein: No evidence of thrombus. Normal compressibility, respiratory phasicity and response to augmentation. Brachial Veins: No evidence of thrombus. Normal compressibility, respiratory phasicity and response to augmentation. Radial Veins: No evidence of thrombus. Normal compressibility, respiratory phasicity and response to augmentation. Ulnar Veins: No evidence of thrombus. Normal compressibility, respiratory phasicity and response to augmentation. Venous Reflux:  None visualized. Other Findings:  None visualized. IMPRESSION: No evidence of DVT within the right upper extremity. Electronically Signed   By: Charlett Nose M.D.   On: 10/19/2023 19:19    ASSESSMENT & PLAN Chelsea Day is a 28 y.o. female presenting to the [facility] for consultation regarding We have reviewed etiologies including ***. Patient will proceed with laboratory workup today.   #  #Age related screenings -  -Patient will RTC when work up is complete.  Patient expressed understanding of the recommended workup and is agreeable to move forward.   All questions were answered. The patient knows to call the clinic with any problems, questions or concerns.  Shared visit with Dr. Marland Kitchen  No orders of the defined types were placed in this encounter.     I have spent a total of {CHL ONC TIME  VISIT - ZOXWR:[id number]} minutes of face-to-face and non-face-to-face time, preparing to see the patient, obtaining and/or reviewing separately obtained history, performing a medically appropriate  examination, counseling and educating the patient, ordering medications/tests/procedures, referring and communicating with other health care professionals, documenting clinical information in the electronic health record, independently interpreting results and communicating results to the patient, and care coordination.   Namon Cirri PA-C Department of Hematology/Oncology [facility] at [facility] Phone: [phone]

## 2023-11-03 ENCOUNTER — Other Ambulatory Visit: Payer: Self-pay

## 2023-11-03 ENCOUNTER — Ambulatory Visit: Admitting: Physician Assistant

## 2023-11-03 ENCOUNTER — Other Ambulatory Visit

## 2023-11-03 ENCOUNTER — Encounter: Payer: Self-pay | Admitting: Medical Oncology

## 2023-11-03 ENCOUNTER — Inpatient Hospital Stay

## 2023-11-03 ENCOUNTER — Inpatient Hospital Stay: Attending: Physician Assistant | Admitting: Physician Assistant

## 2023-11-03 VITALS — BP 153/90 | HR 87 | Temp 98.6°F | Resp 17 | Wt 269.9 lb

## 2023-11-03 DIAGNOSIS — Z800 Family history of malignant neoplasm of digestive organs: Secondary | ICD-10-CM | POA: Diagnosis not present

## 2023-11-03 DIAGNOSIS — R591 Generalized enlarged lymph nodes: Secondary | ICD-10-CM

## 2023-11-03 DIAGNOSIS — Z806 Family history of leukemia: Secondary | ICD-10-CM

## 2023-11-03 DIAGNOSIS — F17290 Nicotine dependence, other tobacco product, uncomplicated: Secondary | ICD-10-CM

## 2023-11-03 DIAGNOSIS — Z803 Family history of malignant neoplasm of breast: Secondary | ICD-10-CM | POA: Diagnosis not present

## 2023-11-03 DIAGNOSIS — Z807 Family history of other malignant neoplasms of lymphoid, hematopoietic and related tissues: Secondary | ICD-10-CM

## 2023-11-03 DIAGNOSIS — Z79899 Other long term (current) drug therapy: Secondary | ICD-10-CM | POA: Diagnosis not present

## 2023-11-03 DIAGNOSIS — R232 Flushing: Secondary | ICD-10-CM

## 2023-11-03 LAB — CMP (CANCER CENTER ONLY)
ALT: 21 U/L (ref 0–44)
AST: 13 U/L — ABNORMAL LOW (ref 15–41)
Albumin: 4.5 g/dL (ref 3.5–5.0)
Alkaline Phosphatase: 65 U/L (ref 38–126)
Anion gap: 6 (ref 5–15)
BUN: 10 mg/dL (ref 6–20)
CO2: 25 mmol/L (ref 22–32)
Calcium: 9.5 mg/dL (ref 8.9–10.3)
Chloride: 107 mmol/L (ref 98–111)
Creatinine: 0.78 mg/dL (ref 0.44–1.00)
GFR, Estimated: >60 mL/min
Glucose, Bld: 101 mg/dL — ABNORMAL HIGH (ref 70–99)
Potassium: 3.9 mmol/L (ref 3.5–5.1)
Sodium: 138 mmol/L (ref 135–145)
Total Bilirubin: 0.6 mg/dL (ref 0.0–1.2)
Total Protein: 7.6 g/dL (ref 6.5–8.1)

## 2023-11-03 LAB — CBC WITH DIFFERENTIAL (CANCER CENTER ONLY)
Abs Immature Granulocytes: 0.04 K/uL (ref 0.00–0.07)
Basophils Absolute: 0.1 K/uL (ref 0.0–0.1)
Basophils Relative: 1 %
Eosinophils Absolute: 0.1 K/uL (ref 0.0–0.5)
Eosinophils Relative: 1 %
HCT: 42 % (ref 36.0–46.0)
Hemoglobin: 14 g/dL (ref 12.0–15.0)
Immature Granulocytes: 0 %
Lymphocytes Relative: 30 %
Lymphs Abs: 4.1 K/uL — ABNORMAL HIGH (ref 0.7–4.0)
MCH: 28.7 pg (ref 26.0–34.0)
MCHC: 33.3 g/dL (ref 30.0–36.0)
MCV: 86.1 fL (ref 80.0–100.0)
Monocytes Absolute: 0.8 K/uL (ref 0.1–1.0)
Monocytes Relative: 6 %
Neutro Abs: 8.7 K/uL — ABNORMAL HIGH (ref 1.7–7.7)
Neutrophils Relative %: 62 %
Platelet Count: 324 K/uL (ref 150–400)
RBC: 4.88 MIL/uL (ref 3.87–5.11)
RDW: 13.8 % (ref 11.5–15.5)
WBC Count: 13.7 K/uL — ABNORMAL HIGH (ref 4.0–10.5)
nRBC: 0 % (ref 0.0–0.2)

## 2023-11-03 LAB — LACTATE DEHYDROGENASE: LDH: 166 U/L (ref 98–192)

## 2023-11-03 LAB — C-REACTIVE PROTEIN: CRP: 0.7 mg/dL (ref <1.0)

## 2023-11-03 LAB — SEDIMENTATION RATE: Sed Rate: 7 mm/h (ref 0–22)

## 2023-11-03 LAB — HEPATITIS C ANTIBODY: HCV Ab: NONREACTIVE

## 2023-11-03 LAB — HIV ANTIBODY (ROUTINE TESTING W REFLEX): HIV Screen 4th Generation wRfx: NONREACTIVE

## 2023-11-03 LAB — HEPATITIS B SURFACE ANTIBODY,QUALITATIVE: Hep B S Ab: NONREACTIVE

## 2023-11-03 LAB — HEPATITIS B SURFACE ANTIGEN: Hepatitis B Surface Ag: NONREACTIVE

## 2023-11-03 NOTE — Progress Notes (Signed)
 [facility]  Patient presented to clinic alone for her scheduled appointment with [facility]. I introduced myself and provided patient with my direct contact information. Patient encouraged to call me with questions/concerns.   Gregary Cromer, RN, BSN, [facility] Oncology Nurse Navigator, [facility] 11/03/2023 12:56 PM

## 2023-11-03 NOTE — Patient Instructions (Addendum)
 Diagnostic Clinic Office Visit Discharge Information and Instructions  Thank you for choosing [facility] for your healthcare needs.  Below is a summary of today's discussion, along with our contact information and an outline of what to expect next.  Reason for Visit:  enlarged lymph nodes  Proposed Diagnostic Care Plan: Labs collected today PET scan ordered ENT referral. The ENT office will contact you to schedule an appointment   What to Expect: - Generally, when lab tests are ordered the results can take up to 1 week for results to be available.  At that point, we will contact you to discuss your results with you.  Unless there is a critical result, we will typically wait for all of your lab results to be available before contacting you. - If a biopsy is part of your Care Plan, those results can take on average 7-10 days to result.  Once results are available, we will contact you to discuss your pathology results and any next steps. - If you have additional imaging ordered, such as a CT Scan, MRI, Ultrasound, Bone Scan, or PET scan, your imaging will need to be authorized then scheduled with the earliest available appointment.  You may be asked to travel to another hospital within [facility] who has a sooner availability, please consider doing so if asked. - If you use MyChart, your results will be available to you in the MyChart portal.  Your provider will be in touch with you as soon as all of your results are available to be discussed.  Your Diagnostic Clinic Provider:  Namon Cirri PA-C and Dr. Candise Che Your Diagnostic Navigator:  Chauncy Lean RN, office number [phone]  If you or your caregiver have number blocking on your cell phones, please ensure the cancer center's numbers are not blocked.  If you are not a registered MyChart user, please consider enrolling in MyChart to receive your test results and visit notes.  You can also access your discharge instructions  electronically.  MyChart also gives you an electronic means to communicate with your Care Team instead of needing to call in to the cancer center.  We appreciate you trusting [place] with your healthcare and look forward to partnering with you as we work to uncover what your potential diagnosis may be.  Please do not hesitate to reach out at any point with questions or concerns.

## 2023-11-04 LAB — SURGICAL PATHOLOGY

## 2023-11-04 LAB — HEPATITIS B CORE ANTIBODY, TOTAL: HEP B CORE AB: NEGATIVE

## 2023-11-04 LAB — CHROMOGRANIN A: Chromogranin A (ng/mL): 37.2 ng/mL (ref 0.0–101.8)

## 2023-11-05 LAB — TRYPTASE: Tryptase: 3.2 ug/L (ref 2.2–13.2)

## 2023-11-06 LAB — METANEPHRINES, PLASMA
Metanephrine, Free: <25 pg/mL (ref 0.0–88.0)
Normetanephrine, Free: 69.7 pg/mL (ref 0.0–210.1)

## 2023-11-07 ENCOUNTER — Encounter (HOSPITAL_COMMUNITY)
Admission: RE | Admit: 2023-11-07 | Discharge: 2023-11-07 | Disposition: A | Discharge status: Home / Self Care | Source: Ambulatory Visit | Attending: Physician Assistant | Admitting: Physician Assistant

## 2023-11-07 ENCOUNTER — Telehealth: Payer: Self-pay | Admitting: Physician Assistant

## 2023-11-07 ENCOUNTER — Ambulatory Visit: Admission: RE | Admit: 2023-11-07 | Source: Ambulatory Visit

## 2023-11-07 DIAGNOSIS — R591 Generalized enlarged lymph nodes: Secondary | ICD-10-CM | POA: Insufficient documentation

## 2023-11-07 LAB — GLUCOSE, CAPILLARY: Glucose-Capillary: 99 mg/dL (ref 70–99)

## 2023-11-07 LAB — FLOW CYTOMETRY

## 2023-11-07 MED ORDER — FLUDEOXYGLUCOSE F - 18 (FDG) INJECTION
13.4500 | Freq: Once | INTRAVENOUS | Status: AC | PRN
  Administered 2023-11-07: 13.43 via INTRAVENOUS

## 2023-11-07 NOTE — Telephone Encounter (Signed)
 Results and plan reviewed with Dr. Leonides Schanz.  I notified Aela Bohan by phone regarding [facility] work up results. Lab work shows normal plasma metanephrines, chromogranin A, Tryptase, and inflammatory markers. Labs do not indicate immunity to hepatitis B. Patient aware she will need to follow up with PCP for the Hepatitis B vaccine series. PET scan does not show evidence of lymphoma. Patient is scheduled to follow up with ENT on 11/10/23 to discuss cervical lymphadenopathy.  Dr. Leonides Schanz recommends proceeding with a bone marrow biopsy to evaluate for Mast Cell Syndrome. Patient is agreeable with the plan. I will order the bone marrow biopsy and work on getting it scheduled.  All of patient's questions were answered and she expressed understanding of the plan provided.

## 2023-11-08 ENCOUNTER — Other Ambulatory Visit: Payer: Self-pay | Admitting: Physician Assistant

## 2023-11-08 DIAGNOSIS — R232 Flushing: Secondary | ICD-10-CM

## 2023-11-08 DIAGNOSIS — R591 Generalized enlarged lymph nodes: Secondary | ICD-10-CM

## 2023-11-10 ENCOUNTER — Encounter (INDEPENDENT_AMBULATORY_CARE_PROVIDER_SITE_OTHER): Payer: Self-pay | Admitting: Otolaryngology

## 2023-11-10 ENCOUNTER — Ambulatory Visit (INDEPENDENT_AMBULATORY_CARE_PROVIDER_SITE_OTHER): Admitting: Otolaryngology

## 2023-11-10 VITALS — BP 137/83 | HR 84 | Ht 71.0 in | Wt 260.0 lb

## 2023-11-10 DIAGNOSIS — J351 Hypertrophy of tonsils: Secondary | ICD-10-CM

## 2023-11-10 DIAGNOSIS — R590 Localized enlarged lymph nodes: Secondary | ICD-10-CM

## 2023-11-10 NOTE — Progress Notes (Signed)
 ENT CONSULT:  Reason for Consult: neck pain and cervical lymphadenopathy   HPI: Discussed the use of AI scribe software for clinical note transcription with the patient, who gave verbal consent to proceed.  History of Present Illness Chelsea Day is a 28 year old female who presents with swollen lymph nodes and tonsils. She was referred by a screening clinic for concerning findings on imaging and exam.  She presents with swollen lymph nodes and tonsils, initially identified through imaging and exam at a screening clinic. An MRI revealed swollen lymph nodes and tonsils, and a PET scan had uptake in the R and L tonsil/mild uptake of the level 2 nodes. Blood work showed WBC 16, prompting further investigation.  She experienced pain in her chest and the right side of her neck, radiating down her arm. The chest discomfort is located in the right upper chest, and the neck pain is on the right side. She describes feeling generally unwell, with pain particularly in her neck, and had spine MRI, with mildly enlarged b/l cervical nodes.   She has noticed a sensation of something in the back of her throat, which has persisted since the end of last year. She describes difficulty swallowing, likening it to the sensation of a pill being stuck. No sore throat and she has not had her tonsils removed. She has a rash and reports generalized fatigue.   Over the past couple of years, she has experienced intermittent skin flushing and, more recently, increased fatigue and night sweats. These symptoms have contributed to her feeling generally unwell.  She has tried antihistamines for her rash without relief. She has been seeing a cancer center for her workup related to mast cell activation sy and carcinoid is also considered. A bone marrow biopsy is scheduled in two weeks to investigate potential mast cell activation sy.  Records Reviewed:  Heme/Onc Office visit 11/03/23: [name] 28 y.o. female with medical  history significant for preeclampsia, hypertension, anxiety, right salpingo oophorectomy.   On review of the previous records patient is being referred by PCP [name], PA-C. Patient presented to the emergency room on 10/19/2023 for acute onset of right sided neck pain with numbness and tingling down her right arm down to her 3rd, 4th and 5th digit. DVT study was negative. EDP's note includes concern for cervical radiculopathy and recommend outpatient MRI.  Patient was started on gabapentin and prednisone.  MRI of cervical spine performed on 10/22/2023 revealed mildly enlarged bilateral cervical lymph nodes and mild asymmetric enlargement of the right palatine tonsil. CT soft tissue neck was performed on 10/31/2023 and the result shows symmetric rate enlarged and nonspecific bilateral level 2A nodes, 15 mm short axis and 2 to 3 cm long axis. Radiologist also commented on mild postinflammatory calcifications of the left palatine tonsil. CT chest was performed 11/01/23 for complaint of chest pressure and was negative for acute infectious process and no lymphadenopathy was seen. Per PCP note patient also had an x-ray done by her chiropractor and orthopedist prior to the original  ED visit.  Labs from PCP visit on 10/28/2023 showed elevated WBC 16.3, elevated neutrophil # 9.3 and lymphocytes  #5.7. Patient had completed prednisone course x 2 days prior to lab work. Patient was also started on Xyzal at recent PCP visit. Further chart review shows patient was worked up in 2022 for concern of mast cell syndrome by Dr. Leonides Schanz.  Labs were overall unremarkable including tryptase and IgE.  It was recommended that patient proceed with  bone marrow biopsy. Patient did not proceed with bone marrow biopsy because of the cost. Dr. Derek Mound office note states: She also was seen by allergist who found that she was negative for allergies to metals, foods, and other substances. GI evaluated her with concern for carcinoid syndrome  was unable to find a clear etiology for her symptoms.    On exam today patient presents unaccompanied. She reports systemic symptoms including night sweats almost every night for the past two to three weeks and flushing episodes nearly ten times a day, affecting her chest, neck, face, arms, and stomach. The flushing is described as 'prickly' with no specific triggers. The flushing started after the birth of her second child and was "under control" and only happening once or twice per day for the last couple of years she states.She also experiences significant fatigue, feeling unwell, and a lack of appetite, leading to unintentional weight loss of approximately 20 pounds since January 2025. Patient adds that she has been consecutively ill since the start of the year with COVID, Norovirus, and the Flu. Most recently had the flu in the beginning of February. Her PCP started her on Xzyal x 6 days ago. Patient also reports pain in her right clavicle that she describes as bone pain that radiates down her arm. She recent saw neurosurgery for this and is scheduled for an MRI of her elbow on 04/07/2.   She reports that her medical history is significant for her father having lymphoma, maternal aunt with  leukemia, maternal grandmother and great-grandmother with breast cancer and a paternal aunt with colon cancer. Patient admits to remote smoking history for 5 years, from age [age] with 2 packs/week. She currently vapes. She does drink alcohol socially. 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   #Cervical lymphadenopathy #recurrent flushing, bodyaches and fatigue - Reviewed CT neck and chest as well as MRI of cervical spine results with patient. - Labs collected today include CBC, CMP, ESR, CRP, LDH, flow cytometry, Hep B & C serologies, HIV serology. Will also check tryptase level, chromogranin A, metanephrines, and 24 Hour urine 5 HIAA.  - PET scan ordered and scheduled to check FDG  activity.  - ENT consult ordered as well for consideration of LN biopsy   #Age related screenings - UTD with pap smear. Otherwise N/A based on age   Past Medical History:  Diagnosis Date   Bilateral ovarian cysts    Endometriosis determined by laparoscopy    History of postpartum uterine bleeding    Irregular periods/menstrual cycles    Pregnancy induced hypertension     Past Surgical History:  Procedure Laterality Date   LAPAROSCOPIC OVARIAN CYSTECTOMY Right 01/24/2015   Procedure: LAPAROSCOPIC LYSIS OF ADHESION, LAPROSCOPIC RIGHT SALPINGOOOPHORECTOMY, BIOPSY OF RIGHT BOARD LIGAMENT; LYSIS BIOPSY OF ADHESIONS, BIOPSY OF LEFT UTERO LIGAMENT;  Surgeon: Kirkland Hun, MD;  Location: [facility];  Service: Gynecology;  Laterality: Right;   LAPAROSCOPY  11/08/2011   Procedure: LAPAROSCOPY OPERATIVE;  Surgeon: Levi Aland, MD;  Location: [facility];  Service: Gynecology;  Laterality: Right;   OVARIAN CYST REMOVAL  11/08/2011   Procedure: OVARIAN CYSTECTOMY;  Surgeon: Levi Aland, MD;  Location: [facility];  Service: Gynecology;  Laterality: Right;   UNILATERAL SALPINGECTOMY Right 01/24/15    Family History  Problem Relation Age of Onset   Cancer Father        ???   Multiple sclerosis Mother    Colon cancer Maternal Grandmother  Colon cancer Maternal Aunt    Allergic rhinitis Neg Hx    Angioedema Neg Hx    Asthma Neg Hx    Eczema Neg Hx    Immunodeficiency Neg Hx    Urticaria Neg Hx     Social History:  reports that she quit smoking about 9 years ago. Her smoking use included cigarettes. She has never used smokeless tobacco. She reports that she does not drink alcohol and does not use drugs.  Allergies:  Allergies  Allergen Reactions   Avocado Anaphylaxis   Banana Anaphylaxis   Gadolinium Derivatives Anaphylaxis, Hives and Palpitations    Other Reaction(s): Flushing   Iodinated Contrast Media Anaphylaxis    Patient reported chest pressure and difficulty breathing after receiving  contrast for PET scan  Other Reaction(s): chest pressure, trouble breathing, facial flushing   Other Hives   Latex Rash    Medications: I have reviewed the patient's current medications.  The PMH, PSH, Medications, Allergies, and SH were reviewed and updated.  ROS: Constitutional: Negative for fever, weight loss and weight gain. Cardiovascular: Negative for chest pain and dyspnea on exertion. Respiratory: Is not experiencing shortness of breath at rest. Gastrointestinal: Negative for nausea and vomiting. Neurological: Negative for headaches. Psychiatric: The patient is not nervous/anxious  Blood pressure 137/83, pulse 84, height 5\' 11"  (1.803 m), weight 260 lb (117.9 kg), SpO2 97%. Body mass index is 36.26 kg/m.  PHYSICAL EXAM:  Exam: General: Well-developed, well-nourished Communication and Voice: Clear pitch and clarity Respiratory Respiratory effort: Equal inspiration and expiration without stridor Cardiovascular Peripheral Vascular: Warm extremities with equal color/perfusion Eyes: No nystagmus with equal extraocular motion bilaterally Neuro/Psych/Balance: Patient oriented to person, place, and time; Appropriate mood and affect; Gait is intact with no imbalance; Cranial nerves I-XII are intact Head and Face Inspection: Normocephalic and atraumatic without mass or lesion Palpation: Facial skeleton intact without bony stepoffs Salivary Glands: No mass or tenderness Facial Strength: Facial motility symmetric and full bilaterally ENT Pinna: External ear intact and fully developed External canal: Canal is patent with intact skin Tympanic Membrane: Clear and mobile External Nose: No scar or anatomic deformity Internal Nose: Septum is deviated to the left. No polyp, or purulence. Mucosal edema and erythema present.  Bilateral inferior turbinate hypertrophy.  Lips, Teeth, and gums: Mucosa and teeth intact and viable TMJ: No pain to palpation with full mobility Oral  cavity/oropharynx: No erythema or exudate, no lesions present 2 to 3+ tonsils b/l symmetric  Nasopharynx: No mass or lesion with intact mucosa Hypopharynx: Intact mucosa without pooling of secretions Larynx Glottic: Full true vocal cord mobility without lesion or mass Supraglottic: Normal appearing epiglottis and AE folds Interarytenoid Space: Moderate pachydermia&edema Subglottic Space: Patent without lesion or edema Neck Neck and Trachea: Midline trachea without mass or lesion Thyroid: No mass or nodularity Lymphatics: palpable b/l lymphadenopathy level 2-3 and skin overlying anterior neck with papular erythematous rash  Procedure: Preoperative diagnosis: cervical lymphadenopathy, and choking on food  Postoperative diagnosis:   Same  Procedure: Flexible fiberoptic laryngoscopy  Surgeon: Ashok Croon, MD  Anesthesia: Topical lidocaine and Afrin Complications: None Condition is stable throughout exam  Indications and consent:  The patient presents to the clinic with above symptoms. Indirect laryngoscopy view was incomplete. Thus it was recommended that they undergo a flexible fiberoptic laryngoscopy. All of the risks, benefits, and potential complications were reviewed with the patient preoperatively and verbal informed consent was obtained.  Procedure: The patient was seated upright in the clinic. Topical lidocaine and Afrin  were applied to the nasal cavity. After adequate anesthesia had occurred, I then proceeded to pass the flexible telescope into the nasal cavity. The nasal cavity was patent without rhinorrhea or polyp. The nasopharynx was also patent without mass or lesion. The base of tongue was visualized and was normal. There were no signs of pooling of secretions in the piriform sinuses. The true vocal folds were mobile bilaterally. There were no signs of glottic or supraglottic mucosal lesion or mass. There was moderate interarytenoid pachydermia and post cricoid edema. The  telescope was then slowly withdrawn and the patient tolerated the procedure throughout.   Studies Reviewed: CT neck 10/31/23 FINDINGS: Pharynx and larynx: Larynx and pharynx soft tissue contours are within normal limits. There are mild postinflammatory calcifications of the left palatine tonsil. Parapharyngeal and retropharyngeal spaces are negative.   Salivary glands: Negative sublingual space. Submandibular and parotid glands are within normal limits.   Thyroid: Negative.   Lymph nodes: Lymph   Enlarged right level 2A lymph node, 15 mm short axis, approximately 23 mm long axis on series 2, image 48 and sagittal image 45.   Slightly smaller contralateral left level 2A lymph node on series 2, image 47 and sagittal image 87, 12-15 mm short axis.   But other bilateral level 2 lymph nodes are normal, 6-7 mm short axis. No cystic or necrotic lymph node. Bilateral level 1, 3 through 5 nodes are within normal limits.   Vascular: Major vascular structures in the bilateral neck and at the skull base are enhancing and appear to be patent. Left IJ appears dominant. Right vertebral artery appears dominant.   Limited intracranial: Negative.   Visualized orbits: Negative.   Mastoids and visualized paranasal sinuses: Clear bilaterally.   Skeleton: No osseous abnormality identified.   Upper chest:  Negative.  See also chest CT performed following day.   IMPRESSION: Symmetrically enlarged and nonspecific bilateral level 2A nodes, 15 mm short axis and 2-3 cm long axis. Otherwise negative CT appearance of the bilateral neck. Recommend follow-up by clinical exam, with repeat Neck CT (IV contrast preferred) if any area further enlarges or becomes painful.    PET/CT 11/07/23 IMPRESSION: 1. No convincing evidence of lymphoma. 2. Asymmetric hypermetabolic activity in the RIGHT greater than LEFT palatine tonsil. Recommend direct visualization potential sampling. 3. Relatively mild metabolic  activity associated with enlarged level 2 cervical lymph nodes. 4. No evidence adenopathy in the chest abdomen pelvis. 5. Normal spleen and bone marrow.  CT chest 11/01/23 Narrative & Impression  CLINICAL DATA:  Chest pain and pressure, swollen lymph nodes   EXAM: CT CHEST WITHOUT CONTRAST   TECHNIQUE: Multidetector CT imaging of the chest was performed following the standard protocol without IV contrast.   RADIATION DOSE REDUCTION: This exam was performed according to the departmental dose-optimization program which includes automated exposure control, adjustment of the mA and/or kV according to patient size and/or use of iterative reconstruction technique.   COMPARISON:  CT abdomen 10/25/2020   FINDINGS: Cardiovascular: Heart size normal. No pericardial effusion. Central great vessels normal in caliber.   Mediastinum/Nodes: No mediastinal hematoma, mass, or adenopathy.   Lungs/Pleura: No pleural effusion. No pneumothorax. Lungs are clear.   Upper Abdomen: No acute abnormality.   Musculoskeletal: Minimal spurring in the lower thoracic spine. No acute findings.   IMPRESSION: Negative   Labs 7 days ago DIAGNOSIS:   - No abnormal B or T-cell population identified   GATING AND PHENOTYPIC ANALYSIS:   Gated population: Flow cytometric immunophenotyping is  performed using  antibodies to the antigens listed in the table below. Electronic gates  are placed around a cell cluster displaying light scatter properties  corresponding to: lymphocytes      Latest Ref Rng & Units 11/03/2023   11:21 AM 07/06/2023    3:01 PM 12/15/2020    2:33 PM  CBC  WBC 4.0 - 10.5 K/uL 13.7  6.5  11.5   Hemoglobin 12.0 - 15.0 g/dL 16.1  09.6  04.5   Hematocrit 36.0 - 46.0 % 42.0  41.8  38.2   Platelets 150 - 400 K/uL 324  306  353      Assessment/Plan: Encounter Diagnoses  Name Primary?   Tonsillar hypertrophy    Cervical lymphadenopathy Yes   Assessment & Plan Cervical  lymphadenopathy and tonsillar hypertrophy MRI and PET scan show cervical lymphadenopathy and tonsillar hypertrophy with increased uptake in the right tonsil. Differential includes chronic inflammation/auto-immune, lymphoma, and reactive lymph nodes, vs malignancy. No evidence of oropharyngeal cancer on flexible scope exam and lymph nodes appear to be ~ 1.5 cm and bilateral on CT neck, mildly FDG avid, and tonsils had FDG uptake on PET/CT R > L.  Visual examination and clinical presentation do not suggest head and neck cancer. Tonsillectomy and excisional biopsy considered for tissue diagnosis. Surgery delayed until bone marrow biopsy results are available to determine if there is an alternate diagnosis that can be made based on results. We discussed doing tonsillectomy upfront vs combining it with L level 2 lymph node excision at the same time - Schedule tonsillectomy with possible cervical node dissection for diagnostic purposes. - Discuss with oncology regarding lymph node biopsy necessity. - Delay surgery until post-bone marrow biopsy results (she is scheduled for bone marrow bx 4/22) - Discuss surgical risks and benefits, including pain, bleeding, and healing complications.  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 Bone marrow biopsy scheduled to investigate. Antihistamines ineffective. Condition may impact surgical healing. - Proceed with scheduled bone marrow biopsy.    Thank you for allowing me to participate in the care of this patient. Please do not hesitate to contact me with any questions or concerns.   Ashok Croon, MD Otolaryngology [facility] Phone: [phone] Fax: [phone]    11/10/2023, 2:27 PM

## 2023-11-10 NOTE — H&P (View-Only) (Signed)
 ENT CONSULT:  Reason for Consult: neck pain and cervical lymphadenopathy   HPI: Discussed the use of AI scribe software for clinical note transcription with the patient, who gave verbal consent to proceed.  History of Present Illness Chelsea Day is a 28 year old female who presents with swollen lymph nodes and tonsils. She was referred by a screening clinic for concerning findings on imaging and exam.  She presents with swollen lymph nodes and tonsils, initially identified through imaging and exam at a screening clinic. An MRI revealed swollen lymph nodes and tonsils, and a PET scan had uptake in the R and L tonsil/mild uptake of the level 2 nodes. Blood work showed WBC 16, prompting further investigation.  She experienced pain in her chest and the right side of her neck, radiating down her arm. The chest discomfort is located in the right upper chest, and the neck pain is on the right side. She describes feeling generally unwell, with pain particularly in her neck, and had spine MRI, with mildly enlarged b/l cervical nodes.   She has noticed a sensation of something in the back of her throat, which has persisted since the end of last year. She describes difficulty swallowing, likening it to the sensation of a pill being stuck. No sore throat and she has not had her tonsils removed. She has a rash and reports generalized fatigue.   Over the past couple of years, she has experienced intermittent skin flushing and, more recently, increased fatigue and night sweats. These symptoms have contributed to her feeling generally unwell.  She has tried antihistamines for her rash without relief. She has been seeing a cancer center for her workup related to mast cell activation sy and carcinoid is also considered. A bone marrow biopsy is scheduled in two weeks to investigate potential mast cell activation sy.  Records Reviewed:  Heme/Onc Office visit 11/03/23: Chelsea Day 28 y.o. female with medical  history significant for preeclampsia, hypertension, anxiety, right salpingo oophorectomy.   On review of the previous records patient is being referred by PCP Chelsea Soho, PA-C. Patient presented to the emergency room on 10/19/2023 for acute onset of right sided neck pain with numbness and tingling down her right arm down to her 3rd, 4th and 5th digit. DVT study was negative. EDP's note includes concern for cervical radiculopathy and recommend outpatient MRI.  Patient was started on gabapentin and prednisone.  MRI of cervical spine performed on 10/22/2023 revealed mildly enlarged bilateral cervical lymph nodes and mild asymmetric enlargement of the right palatine tonsil. CT soft tissue neck was performed on 10/31/2023 and the result shows symmetric rate enlarged and nonspecific bilateral level 2A nodes, 15 mm short axis and 2 to 3 cm long axis. Radiologist also commented on mild postinflammatory calcifications of the left palatine tonsil. CT chest was performed 11/01/23 for complaint of chest pressure and was negative for acute infectious process and no lymphadenopathy was seen. Per PCP note patient also had an x-ray done by her chiropractor and orthopedist prior to the original  ED visit.  Labs from PCP visit on 10/28/2023 showed elevated WBC 16.3, elevated neutrophil # 9.3 and lymphocytes  #5.7. Patient had completed prednisone course x 2 days prior to lab work. Patient was also started on Xyzal at recent PCP visit. Further chart review shows patient was worked up in 2022 for concern of mast cell syndrome by Chelsea Day.  Labs were overall unremarkable including tryptase and IgE.  It was recommended that patient proceed with  bone marrow biopsy. Patient did not proceed with bone marrow biopsy because of the cost. Chelsea Day office note states: She also was seen by allergist who found that she was negative for allergies to metals, foods, and other substances. GI evaluated her with concern for carcinoid syndrome  was unable to find a clear etiology for her symptoms.    On exam today patient presents unaccompanied. She reports systemic symptoms including night sweats almost every night for the past two to three weeks and flushing episodes nearly ten times a day, affecting her chest, neck, face, arms, and stomach. The flushing is described as 'prickly' with no specific triggers. The flushing started after the birth of her second child and was "under control" and only happening once or twice per day for the last couple of years she states.She also experiences significant fatigue, feeling unwell, and a lack of appetite, leading to unintentional weight loss of approximately 20 pounds since January 2025. Patient adds that she has been consecutively ill since the start of the year with COVID, Norovirus, and the Flu. Most recently had the flu in the beginning of February. Her PCP started her on Xzyal x 6 days ago. Patient also reports pain in her right clavicle that she describes as bone pain that radiates down her arm. She recent saw neurosurgery for this and is scheduled for an MRI of her elbow on 04/07/2.   She reports that her medical history is significant for her father having lymphoma, maternal aunt with  leukemia, maternal grandmother and great-grandmother with breast cancer and a paternal aunt with colon cancer. Patient admits to remote smoking history for 5 years, from age [age] with 2 packs/week. She currently vapes. She does drink alcohol socially. 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   #Cervical lymphadenopathy #recurrent flushing, bodyaches and fatigue - Reviewed CT neck and chest as well as MRI of cervical spine results with patient. - Labs collected today include CBC, CMP, ESR, CRP, LDH, flow cytometry, Hep B & C serologies, HIV serology. Will also check tryptase level, chromogranin A, metanephrines, and 24 Hour urine 5 HIAA.  - PET scan ordered and scheduled to check FDG  activity.  - ENT consult ordered as well for consideration of LN biopsy   #Age related screenings - UTD with pap smear. Otherwise N/A based on age   Past Medical History:  Diagnosis Date   Bilateral ovarian cysts    Endometriosis determined by laparoscopy    History of postpartum uterine bleeding    Irregular periods/menstrual cycles    Pregnancy induced hypertension     Past Surgical History:  Procedure Laterality Date   LAPAROSCOPIC OVARIAN CYSTECTOMY Right 01/24/2015   Procedure: LAPAROSCOPIC LYSIS OF ADHESION, LAPROSCOPIC RIGHT SALPINGOOOPHORECTOMY, BIOPSY OF RIGHT BOARD LIGAMENT; LYSIS BIOPSY OF ADHESIONS, BIOPSY OF LEFT UTERO LIGAMENT;  Surgeon: Kirkland Hun, MD;  Location: [facility];  Service: Gynecology;  Laterality: Right;   LAPAROSCOPY  11/08/2011   Procedure: LAPAROSCOPY OPERATIVE;  Surgeon: Levi Aland, MD;  Location: [facility];  Service: Gynecology;  Laterality: Right;   OVARIAN CYST REMOVAL  11/08/2011   Procedure: OVARIAN CYSTECTOMY;  Surgeon: Levi Aland, MD;  Location: [facility];  Service: Gynecology;  Laterality: Right;   UNILATERAL SALPINGECTOMY Right 01/24/15    Family History  Problem Relation Age of Onset   Cancer Father        ???   Multiple sclerosis Mother    Colon cancer Maternal Grandmother  Colon cancer Maternal Aunt    Allergic rhinitis Neg Hx    Angioedema Neg Hx    Asthma Neg Hx    Eczema Neg Hx    Immunodeficiency Neg Hx    Urticaria Neg Hx     Social History:  reports that she quit smoking about 9 years ago. Her smoking use included cigarettes. She has never used smokeless tobacco. She reports that she does not drink alcohol and does not use drugs.  Allergies:  Allergies  Allergen Reactions   Avocado Anaphylaxis   Banana Anaphylaxis   Gadolinium Derivatives Anaphylaxis, Hives and Palpitations    Other Reaction(s): Flushing   Iodinated Contrast Media Anaphylaxis    Patient reported chest pressure and difficulty breathing after receiving  contrast for PET scan  Other Reaction(s): chest pressure, trouble breathing, facial flushing   Other Hives   Latex Rash    Medications: I have reviewed the patient's current medications.  The PMH, PSH, Medications, Allergies, and SH were reviewed and updated.  ROS: Constitutional: Negative for fever, weight loss and weight gain. Cardiovascular: Negative for chest pain and dyspnea on exertion. Respiratory: Is not experiencing shortness of breath at rest. Gastrointestinal: Negative for nausea and vomiting. Neurological: Negative for headaches. Psychiatric: The patient is not nervous/anxious  Blood pressure 137/83, pulse 84, height 5\' 11"  (1.803 m), weight 260 lb (117.9 kg), SpO2 97%. Body mass index is 36.26 kg/m.  PHYSICAL EXAM:  Exam: General: Well-developed, well-nourished Communication and Voice: Clear pitch and clarity Respiratory Respiratory effort: Equal inspiration and expiration without stridor Cardiovascular Peripheral Vascular: Warm extremities with equal color/perfusion Eyes: No nystagmus with equal extraocular motion bilaterally Neuro/Psych/Balance: Patient oriented to person, place, and time; Appropriate mood and affect; Gait is intact with no imbalance; Cranial nerves I-XII are intact Head and Face Inspection: Normocephalic and atraumatic without mass or lesion Palpation: Facial skeleton intact without bony stepoffs Salivary Glands: No mass or tenderness Facial Strength: Facial motility symmetric and full bilaterally ENT Pinna: External ear intact and fully developed External canal: Canal is patent with intact skin Tympanic Membrane: Clear and mobile External Nose: No scar or anatomic deformity Internal Nose: Septum is deviated to the left. No polyp, or purulence. Mucosal edema and erythema present.  Bilateral inferior turbinate hypertrophy.  Lips, Teeth, and gums: Mucosa and teeth intact and viable TMJ: No pain to palpation with full mobility Oral  cavity/oropharynx: No erythema or exudate, no lesions present 2 to 3+ tonsils b/l symmetric  Nasopharynx: No mass or lesion with intact mucosa Hypopharynx: Intact mucosa without pooling of secretions Larynx Glottic: Full true vocal cord mobility without lesion or mass Supraglottic: Normal appearing epiglottis and AE folds Interarytenoid Space: Moderate pachydermia&edema Subglottic Space: Patent without lesion or edema Neck Neck and Trachea: Midline trachea without mass or lesion Thyroid: No mass or nodularity Lymphatics: palpable b/l lymphadenopathy level 2-3 and skin overlying anterior neck with papular erythematous rash  Procedure: Preoperative diagnosis: cervical lymphadenopathy, and choking on food  Postoperative diagnosis:   Same  Procedure: Flexible fiberoptic laryngoscopy  Surgeon: Ashok Croon, MD  Anesthesia: Topical lidocaine and Afrin Complications: None Condition is stable throughout exam  Indications and consent:  The patient presents to the clinic with above symptoms. Indirect laryngoscopy view was incomplete. Thus it was recommended that they undergo a flexible fiberoptic laryngoscopy. All of the risks, benefits, and potential complications were reviewed with the patient preoperatively and verbal informed consent was obtained.  Procedure: The patient was seated upright in the clinic. Topical lidocaine and Afrin  were applied to the nasal cavity. After adequate anesthesia had occurred, I then proceeded to pass the flexible telescope into the nasal cavity. The nasal cavity was patent without rhinorrhea or polyp. The nasopharynx was also patent without mass or lesion. The base of tongue was visualized and was normal. There were no signs of pooling of secretions in the piriform sinuses. The true vocal folds were mobile bilaterally. There were no signs of glottic or supraglottic mucosal lesion or mass. There was moderate interarytenoid pachydermia and post cricoid edema. The  telescope was then slowly withdrawn and the patient tolerated the procedure throughout.   Studies Reviewed: CT neck 10/31/23 FINDINGS: Pharynx and larynx: Larynx and pharynx soft tissue contours are within normal limits. There are mild postinflammatory calcifications of the left palatine tonsil. Parapharyngeal and retropharyngeal spaces are negative.   Salivary glands: Negative sublingual space. Submandibular and parotid glands are within normal limits.   Thyroid: Negative.   Lymph nodes: Lymph   Enlarged right level 2A lymph node, 15 mm short axis, approximately 23 mm long axis on series 2, image 48 and sagittal image 45.   Slightly smaller contralateral left level 2A lymph node on series 2, image 47 and sagittal image 87, 12-15 mm short axis.   But other bilateral level 2 lymph nodes are normal, 6-7 mm short axis. No cystic or necrotic lymph node. Bilateral level 1, 3 through 5 nodes are within normal limits.   Vascular: Major vascular structures in the bilateral neck and at the skull base are enhancing and appear to be patent. Left IJ appears dominant. Right vertebral artery appears dominant.   Limited intracranial: Negative.   Visualized orbits: Negative.   Mastoids and visualized paranasal sinuses: Clear bilaterally.   Skeleton: No osseous abnormality identified.   Upper chest:  Negative.  See also chest CT performed following day.   IMPRESSION: Symmetrically enlarged and nonspecific bilateral level 2A nodes, 15 mm short axis and 2-3 cm long axis. Otherwise negative CT appearance of the bilateral neck. Recommend follow-up by clinical exam, with repeat Neck CT (IV contrast preferred) if any area further enlarges or becomes painful.    PET/CT 11/07/23 IMPRESSION: 1. No convincing evidence of lymphoma. 2. Asymmetric hypermetabolic activity in the RIGHT greater than LEFT palatine tonsil. Recommend direct visualization potential sampling. 3. Relatively mild metabolic  activity associated with enlarged level 2 cervical lymph nodes. 4. No evidence adenopathy in the chest abdomen pelvis. 5. Normal spleen and bone marrow.  CT chest 11/01/23 Narrative & Impression  CLINICAL DATA:  Chest pain and pressure, swollen lymph nodes   EXAM: CT CHEST WITHOUT CONTRAST   TECHNIQUE: Multidetector CT imaging of the chest was performed following the standard protocol without IV contrast.   RADIATION DOSE REDUCTION: This exam was performed according to the departmental dose-optimization program which includes automated exposure control, adjustment of the mA and/or kV according to patient size and/or use of iterative reconstruction technique.   COMPARISON:  CT abdomen 10/25/2020   FINDINGS: Cardiovascular: Heart size normal. No pericardial effusion. Central great vessels normal in caliber.   Mediastinum/Nodes: No mediastinal hematoma, mass, or adenopathy.   Lungs/Pleura: No pleural effusion. No pneumothorax. Lungs are clear.   Upper Abdomen: No acute abnormality.   Musculoskeletal: Minimal spurring in the lower thoracic spine. No acute findings.   IMPRESSION: Negative   Labs 7 days ago DIAGNOSIS:   - No abnormal B or T-cell population identified   GATING AND PHENOTYPIC ANALYSIS:   Gated population: Flow cytometric immunophenotyping is  performed using  antibodies to the antigens listed in the table below. Electronic gates  are placed around a cell cluster displaying light scatter properties  corresponding to: lymphocytes      Latest Ref Rng & Units 11/03/2023   11:21 AM 07/06/2023    3:01 PM 12/15/2020    2:33 PM  CBC  WBC 4.0 - 10.5 K/uL 13.7  6.5  11.5   Hemoglobin 12.0 - 15.0 g/dL 16.1  09.6  04.5   Hematocrit 36.0 - 46.0 % 42.0  41.8  38.2   Platelets 150 - 400 K/uL 324  306  353      Assessment/Plan: Encounter Diagnoses  Name Primary?   Tonsillar hypertrophy    Cervical lymphadenopathy Yes   Assessment & Plan Cervical  lymphadenopathy and tonsillar hypertrophy MRI and PET scan show cervical lymphadenopathy and tonsillar hypertrophy with increased uptake in the right tonsil. Differential includes chronic inflammation/auto-immune, lymphoma, and reactive lymph nodes, vs malignancy. No evidence of oropharyngeal cancer on flexible scope exam and lymph nodes appear to be ~ 1.5 cm and bilateral on CT neck, mildly FDG avid, and tonsils had FDG uptake on PET/CT R > L.  Visual examination and clinical presentation do not suggest head and neck cancer. Tonsillectomy and excisional biopsy considered for tissue diagnosis. Surgery delayed until bone marrow biopsy results are available to determine if there is an alternate diagnosis that can be made based on results. We discussed doing tonsillectomy upfront vs combining it with L level 2 lymph node excision at the same time - Schedule tonsillectomy with possible cervical node dissection for diagnostic purposes. - Discuss with oncology regarding lymph node biopsy necessity. - Delay surgery until post-bone marrow biopsy results (she is scheduled for bone marrow bx 4/22) - Discuss surgical risks and benefits, including pain, bleeding, and healing complications.  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 Bone marrow biopsy scheduled to investigate. Antihistamines ineffective. Condition may impact surgical healing. - Proceed with scheduled bone marrow biopsy.    Thank you for allowing me to participate in the care of this patient. Please do not hesitate to contact me with any questions or concerns.   Ashok Croon, MD Otolaryngology [facility] Phone: [phone] Fax: [phone]    11/10/2023, 2:27 PM

## 2023-11-21 ENCOUNTER — Other Ambulatory Visit: Payer: Self-pay

## 2023-11-21 ENCOUNTER — Encounter (HOSPITAL_BASED_OUTPATIENT_CLINIC_OR_DEPARTMENT_OTHER): Payer: Self-pay | Admitting: *Deleted

## 2023-11-21 ENCOUNTER — Other Ambulatory Visit: Payer: Self-pay | Admitting: Radiology

## 2023-11-21 DIAGNOSIS — Z01812 Encounter for preprocedural laboratory examination: Secondary | ICD-10-CM

## 2023-11-21 NOTE — Progress Notes (Signed)
 Patient for IR Bone Marrow Biopsy on Tues 11/22/23, I called and spoke with the patient on the phone and gave pre-procedure instructions. Pt was made aware to be here at 8:30a, NPO after MN prior to procedure as well as driver post procedure/recovery/discharge. Pt stated understanding.  Called 11/22/23

## 2023-11-22 ENCOUNTER — Encounter: Payer: Self-pay | Admitting: Radiology

## 2023-11-22 ENCOUNTER — Ambulatory Visit
Admission: RE | Admit: 2023-11-22 | Discharge: 2023-11-22 | Disposition: A | Discharge status: Home / Self Care | Source: Ambulatory Visit | Attending: Physician Assistant | Admitting: Physician Assistant

## 2023-11-22 ENCOUNTER — Other Ambulatory Visit: Payer: Self-pay

## 2023-11-22 DIAGNOSIS — M542 Cervicalgia: Secondary | ICD-10-CM | POA: Diagnosis not present

## 2023-11-22 DIAGNOSIS — R5383 Other fatigue: Secondary | ICD-10-CM | POA: Diagnosis not present

## 2023-11-22 DIAGNOSIS — F17290 Nicotine dependence, other tobacco product, uncomplicated: Secondary | ICD-10-CM | POA: Diagnosis not present

## 2023-11-22 DIAGNOSIS — R591 Generalized enlarged lymph nodes: Secondary | ICD-10-CM

## 2023-11-22 DIAGNOSIS — R590 Localized enlarged lymph nodes: Secondary | ICD-10-CM | POA: Insufficient documentation

## 2023-11-22 DIAGNOSIS — Z01812 Encounter for preprocedural laboratory examination: Secondary | ICD-10-CM

## 2023-11-22 DIAGNOSIS — R232 Flushing: Secondary | ICD-10-CM

## 2023-11-22 HISTORY — PX: IR BONE MARROW BIOPSY & ASPIRATION: IMG5727

## 2023-11-22 LAB — CBC WITH DIFFERENTIAL/PLATELET
Abs Immature Granulocytes: 0.02 10*3/uL (ref 0.00–0.07)
Basophils Absolute: 0.1 10*3/uL (ref 0.0–0.1)
Basophils Relative: 1 %
Eosinophils Absolute: 0.4 10*3/uL (ref 0.0–0.5)
Eosinophils Relative: 4 %
HCT: 40.6 % (ref 36.0–46.0)
Hemoglobin: 13.2 g/dL (ref 12.0–15.0)
Immature Granulocytes: 0 %
Lymphocytes Relative: 38 %
Lymphs Abs: 3.6 10*3/uL (ref 0.7–4.0)
MCH: 28.4 pg (ref 26.0–34.0)
MCHC: 32.5 g/dL (ref 30.0–36.0)
MCV: 87.5 fL (ref 80.0–100.0)
Monocytes Absolute: 0.6 10*3/uL (ref 0.1–1.0)
Monocytes Relative: 6 %
Neutro Abs: 4.7 10*3/uL (ref 1.7–7.7)
Neutrophils Relative %: 51 %
Platelets: 362 10*3/uL (ref 150–400)
RBC: 4.64 MIL/uL (ref 3.87–5.11)
RDW: 13.3 % (ref 11.5–15.5)
WBC: 9.3 10*3/uL (ref 4.0–10.5)
nRBC: 0 % (ref 0.0–0.2)

## 2023-11-22 MED ORDER — MIDAZOLAM HCL 2 MG/2ML IJ SOLN
INTRAMUSCULAR | Status: AC
  Filled 2023-11-22: qty 2

## 2023-11-22 MED ORDER — SODIUM CHLORIDE 0.9 % IV SOLN: INTRAVENOUS | Status: DC

## 2023-11-22 MED ORDER — FENTANYL CITRATE (PF) 100 MCG/2ML IJ SOLN
INTRAMUSCULAR | Status: AC
  Filled 2023-11-22: qty 2

## 2023-11-22 MED ORDER — HEPARIN SOD (PORK) LOCK FLUSH 100 UNIT/ML IV SOLN
INTRAVENOUS | Status: AC
  Filled 2023-11-22: qty 5

## 2023-11-22 MED ORDER — FENTANYL CITRATE (PF) 100 MCG/2ML IJ SOLN
INTRAMUSCULAR | Status: AC | PRN
  Administered 2023-11-22: 25 ug via INTRAVENOUS
  Administered 2023-11-22: 50 ug via INTRAVENOUS
  Administered 2023-11-22: 25 ug via INTRAVENOUS

## 2023-11-22 MED ORDER — MIDAZOLAM HCL 2 MG/2ML IJ SOLN
INTRAMUSCULAR | Status: AC | PRN
  Administered 2023-11-22: 1 mg via INTRAVENOUS
  Administered 2023-11-22 (×2): .5 mg via INTRAVENOUS

## 2023-11-22 NOTE — Procedures (Signed)
 Interventional Radiology Procedure:   Indications: Cervical lymphadenopathy, evaluate for mast cell syndrome  Procedure: Fluoroscopically guided bone marrow biopsy  Findings: 2 aspirates and 1 core from right ilium  Complications: None     EBL: Minimal, less than 10 ml  Plan: Discharge to home in one hour.   Chelsea Day R. Chelsea Ogren, MD  Pager: [phone]

## 2023-11-22 NOTE — H&P (Signed)
 Chief Complaint: Lymphadenopathy; Request for image guided bone marrow biopsy to rule out mast cell syndrome  Referring Provider(s): Darilyn Edin   Supervising Physician: Elene Griffes  Patient Status: [facility] - Out-pt  History of Present Illness: Chelsea Day is a 28 y.o. female with a past medical history significant for ovarian cysts endometriosis, lymphadenopathy, pregnancy-induced hypertension.  She has also been experiencing neck pain, swollen lymph nodes, intermittent skin flushing, fatigue, syncope, and night sweats which has led to referrals to ENT and oncology. Per 11/07/23 note from Adline Hook, PA-C with oncology:  "Lab work shows normal plasma metanephrines, chromogranin A, Tryptase, and inflammatory markers. Labs do not indicate immunity to hepatitis B. Patient aware she will need to follow up with PCP for the Hepatitis B vaccine series. PET scan does not show evidence of lymphoma. Patient is scheduled to follow up with ENT on 11/10/23 to discuss cervical lymphadenopathy.  Dr. Rosaline Coma recommends proceeding with a bone marrow biopsy to evaluate for Mast Cell Syndrome." ENT recommends  tonsillectomy with possible cervical node dissection for diagnostic purposes, but this is to be held until BMB complete to allow for alternative diagnosis.   She arrives today having been NPO since [place], with her husband who will drive home, and with no new infectious concerns. She endorses fatigue. Denies fever, chills, bleeding concerns. See below for full ROS.   Not on a blood thinner, no known poor reactions to sedation, does not wear a CPAP.    Patient is Full Code  Past Medical History:  Diagnosis Date   Bilateral ovarian cysts    Endometriosis determined by laparoscopy    History of postpartum uterine bleeding    Irregular periods/menstrual cycles    Lymphadenopathy    Pregnancy induced hypertension     Past Surgical History:  Procedure Laterality Date   LAPAROSCOPIC OVARIAN  CYSTECTOMY Right 01/24/2015   Procedure: LAPAROSCOPIC LYSIS OF ADHESION, LAPROSCOPIC RIGHT SALPINGOOOPHORECTOMY, BIOPSY OF RIGHT BOARD LIGAMENT; LYSIS BIOPSY OF ADHESIONS, BIOPSY OF LEFT UTERO LIGAMENT;  Surgeon: Lula Sale, MD;  Location: [facility];  Service: Gynecology;  Laterality: Right;   LAPAROSCOPY  11/08/2011   Procedure: LAPAROSCOPY OPERATIVE;  Surgeon: Hamp Levine, MD;  Location: [facility];  Service: Gynecology;  Laterality: Right;   OVARIAN CYST REMOVAL  11/08/2011   Procedure: OVARIAN CYSTECTOMY;  Surgeon: Hamp Levine, MD;  Location: [facility];  Service: Gynecology;  Laterality: Right;   UNILATERAL SALPINGECTOMY Right 01/24/15    Allergies: Avocado, Banana, Gadolinium derivatives, Iodinated contrast media, and Latex  Medications: Prior to Admission medications   Medication Sig Start Date End Date Taking? Authorizing Provider  cetirizine (ZYRTEC) 10 MG chewable tablet Chew 10 mg by mouth daily.   Yes [provider]  dicyclomine  (BENTYL ) 20 MG tablet Take 1 tablet (20 mg total) by mouth 4 (four) times daily as needed (abdominal cramping). 10/25/20  Yes Molpus, John, MD  ibuprofen  (ADVIL ) 600 MG tablet Take 1 tablet (600 mg total) by mouth every 6 (six) hours. Patient taking differently: Take 600 mg by mouth every 6 (six) hours as needed for fever, headache or mild pain (pain score 1-3). 09/03/19  Yes Johnn Najjar, MD  etonogestrel (NEXPLANON) 68 MG IMPL implant 1 each by Subdermal route once.    [provider]  gabapentin  (NEURONTIN ) 100 MG capsule Take 1 capsule (100 mg total) by mouth 2 (two) times daily for 15 days. Patient not taking: Reported on 11/21/2023 10/19/23 11/03/23  Bauer, Collin S, PA-C  ondansetron  (ZOFRAN )  4 MG tablet Take 1 tablet (4 mg total) by mouth every 6 (six) hours. Patient not taking: Reported on 11/22/2023 07/06/23   Mandy Second, PA-C  predniSONE  (DELTASONE ) 50 MG tablet Take 1 tablet by mouth daily for 5 days Patient not taking: Reported  on 11/22/2023 10/19/23   Hayes Lipps, PA-C  sertraline  (ZOLOFT ) 50 MG tablet Take 1 tablet (50 mg total) by mouth daily. Patient not taking: Reported on 06/12/2020 09/03/19 10/25/20  Johnn Najjar, MD     Family History  Problem Relation Age of Onset   Cancer Father        ???   Multiple sclerosis Mother    Colon cancer Maternal Grandmother    Colon cancer Maternal Aunt    Allergic rhinitis Neg Hx    Angioedema Neg Hx    Asthma Neg Hx    Eczema Neg Hx    Immunodeficiency Neg Hx    Urticaria Neg Hx     Social History   Socioeconomic History   Marital status: Married    Spouse name: Not on file   Number of children: Not on file   Years of education: Not on file   Highest education level: Not on file  Occupational History   Not on file  Tobacco Use   Smoking status: Former    Current packs/day: 0.00    Types: Cigarettes    Quit date: 11/30/2013    Years since quitting: 9.9   Smokeless tobacco: Never  Vaping Use   Vaping status: Every Day   Substances: Nicotine, Flavoring  Substance and Sexual Activity   Alcohol use: No    Alcohol/week: 0.0 standard drinks of alcohol    Comment: social, not while pregnant   Drug use: No   Sexual activity: Yes  Other Topics Concern   Not on file  Social History Narrative   Not on file   Social Drivers of Health   Financial Resource Strain: Not on file  Food Insecurity: Not on file  Transportation Needs: Not on file  Physical Activity: Not on file  Stress: Not on file  Social Connections: Unknown (12/14/2021)   [facility]   Social Network    Social Network: Not on file     Review of Systems: A 12 point ROS discussed and pertinent positives are indicated in the HPI above.  All other systems are negative.  Review of Systems  Constitutional:  Positive for fatigue. Negative for chills and fever.  HENT:  Negative for sore throat.   Eyes:  Negative for visual disturbance.  Respiratory:  Negative  for chest tightness and shortness of breath.   Cardiovascular:  Negative for chest pain.  Gastrointestinal:  Negative for abdominal pain, blood in stool, nausea and vomiting.  Genitourinary:  Negative for hematuria.  Musculoskeletal:  Positive for neck pain.  Skin:  Negative for rash and wound.  Neurological:  Negative for headaches.  Hematological:  Does not bruise/bleed easily.    Vital Signs: BP 130/80 (BP Location: Right Arm)   Pulse 76   Temp 98.2 F (36.8 C) (Oral)   Resp 20   Ht 5\' 10"  (1.778 m)   Wt 260 lb (117.9 kg)   SpO2 99%   BMI 37.31 kg/m     Physical Exam HENT:     Mouth/Throat:     Mouth: Mucous membranes are moist.     Pharynx: Oropharynx is clear.  Cardiovascular:     Rate and Rhythm: Normal rate  and regular rhythm.     Pulses: Normal pulses.     Heart sounds: Normal heart sounds.  Pulmonary:     Effort: Pulmonary effort is normal.     Breath sounds: Normal breath sounds.  Musculoskeletal:     Right lower leg: No edema.     Left lower leg: No edema.  Skin:    General: Skin is warm and dry.     Comments: No rash or wounds visualized at site of planned puncture  Neurological:     Mental Status: She is alert and oriented to person, place, and time.  Psychiatric:        Mood and Affect: Mood normal.        Behavior: Behavior normal.        Thought Content: Thought content normal.        Judgment: Judgment normal.     Imaging: NM PET Image Initial (PI) Skull Base To Thigh Result Date: 11/07/2023 CLINICAL DATA:  Initial treatment strategy for cervical lymphadenopathy. EXAM: NUCLEAR MEDICINE PET SKULL BASE TO THIGH TECHNIQUE: 13.4 mCi F-18 FDG was injected intravenously. Full-ring PET imaging was performed from the skull base to thigh after the radiotracer. CT data was obtained and used for attenuation correction and anatomic localization. Fasting blood glucose: 99 mg/dl COMPARISON:  Neck CT 16/05/9603, CT chest 11/01/2023 FINDINGS: Mediastinal blood  pool activity: SUV max 2.2 Liver activity: SUV max 3.3 NECK: Hypermetabolic activity in the LEFT and RIGHT palatine tonsil. The activity in the RIGHT tonsil involves a greater volume with SUV max equal limb 11.3 on image 29. There is a mildly metabolic enlarged RIGHT level 2 lymph node measuring 14 mm SUV max equal 4.2 on image 31. Similar lymph node on the LEFT measures 12 mm with SUV max equal 3.0. There is hypermetabolic brown fat along the inferior posterior triangles and upper thoracic inlet. Incidental CT findings: None. CHEST: BAT hypermetabolic activity through the thoracic inlet is favored benign vascular activity. No hypermetabolic mediastinal lymph nodes. Hypermetabolic brown fat along the spine. No discrete hypermetabolic pulmonary nodules. No hypermetabolic axillary lymph nodes. No suspicious pulmonary nodules. Incidental CT findings: None. ABDOMEN/PELVIS: No abnormal hypermetabolic activity within the liver, pancreas, adrenal glands, or spleen. No hypermetabolic lymph nodes in the abdomen or pelvis. Spleen is normal volume and normal metabolic activity. Incidental CT findings: None. SKELETON: No focal hypermetabolic activity to suggest skeletal metastasis. Incidental CT findings: Ossific fragment along the RIGHT inferior pubic ramus suggests remote avulsion fracture. IMPRESSION: 1. No convincing evidence of lymphoma. 2. Asymmetric hypermetabolic activity in the RIGHT greater than LEFT palatine tonsil. Recommend direct visualization potential sampling. 3. Relatively mild metabolic activity associated with enlarged level 2 cervical lymph nodes. 4. No evidence adenopathy in the chest abdomen pelvis. 5. Normal spleen and bone marrow. Electronically Signed   By: Deboraha Fallow M.D.   On: 11/07/2023 16:06   CT SOFT TISSUE NECK W CONTRAST Result Date: 11/02/2023 CLINICAL DATA:  28 year old female "right greater than left swollen lymph nodes bilateral neck". EXAM: CT NECK WITH CONTRAST TECHNIQUE:  Multidetector CT imaging of the neck was performed using the standard protocol following the bolus administration of intravenous contrast. RADIATION DOSE REDUCTION: This exam was performed according to the departmental dose-optimization program which includes automated exposure control, adjustment of the mA and/or kV according to patient size and/or use of iterative reconstruction technique. CONTRAST:  75mL OMNIPAQUE  IOHEXOL  300 MG/ML  SOLN COMPARISON:  Chest CT on 11/01/2023 reported separately. PET-CT 06/30/2020 FINDINGS: Pharynx and larynx:  Larynx and pharynx soft tissue contours are within normal limits. There are mild postinflammatory calcifications of the left palatine tonsil. Parapharyngeal and retropharyngeal spaces are negative. Salivary glands: Negative sublingual space. Submandibular and parotid glands are within normal limits. Thyroid : Negative. Lymph nodes: Lymph Enlarged right level 2A lymph node, 15 mm short axis, approximately 23 mm long axis on series 2, image 48 and sagittal image 45. Slightly smaller contralateral left level 2A lymph node on series 2, image 47 and sagittal image 87, 12-15 mm short axis. But other bilateral level 2 lymph nodes are normal, 6-7 mm short axis. No cystic or necrotic lymph node. Bilateral level 1, 3 through 5 nodes are within normal limits. Vascular: Major vascular structures in the bilateral neck and at the skull base are enhancing and appear to be patent. Left IJ appears dominant. Right vertebral artery appears dominant. Limited intracranial: Negative. Visualized orbits: Negative. Mastoids and visualized paranasal sinuses: Clear bilaterally. Skeleton: No osseous abnormality identified. Upper chest:  Negative.  See also chest CT performed following day. IMPRESSION: Symmetrically enlarged and nonspecific bilateral level 2A nodes, 15 mm short axis and 2-3 cm long axis. Otherwise negative CT appearance of the bilateral neck. Recommend follow-up by clinical exam, with  repeat Neck CT (IV contrast preferred) if any area further enlarges or becomes painful. Electronically Signed   By: Marlise Simpers M.D.   On: 11/02/2023 08:54   CT CHEST WO CONTRAST Result Date: 11/01/2023 CLINICAL DATA:  Chest pain and pressure, swollen lymph nodes EXAM: CT CHEST WITHOUT CONTRAST TECHNIQUE: Multidetector CT imaging of the chest was performed following the standard protocol without IV contrast. RADIATION DOSE REDUCTION: This exam was performed according to the departmental dose-optimization program which includes automated exposure control, adjustment of the mA and/or kV according to patient size and/or use of iterative reconstruction technique. COMPARISON:  CT abdomen 10/25/2020 FINDINGS: Cardiovascular: Heart size normal. No pericardial effusion. Central great vessels normal in caliber. Mediastinum/Nodes: No mediastinal hematoma, mass, or adenopathy. Lungs/Pleura: No pleural effusion. No pneumothorax. Lungs are clear. Upper Abdomen: No acute abnormality. Musculoskeletal: Minimal spurring in the lower thoracic spine. No acute findings. IMPRESSION: Negative Electronically Signed   By: Nicoletta Barrier M.D.   On: 11/01/2023 17:51    Labs:  CBC: Recent Labs    07/06/23 1501 11/03/23 1121 11/22/23 0849  WBC 6.5 13.7* 9.3  HGB 14.1 14.0 13.2  HCT 41.8 42.0 40.6  PLT 306 324 362    COAGS: No results for input(s): "INR", "APTT" in the last 8760 hours.  BMP: Recent Labs    07/06/23 1501 11/03/23 1121  NA 141 138  K 3.3* 3.9  CL 105 107  CO2 26 25  GLUCOSE 106* 101*  BUN 9 10  CALCIUM  9.7 9.5  CREATININE 0.79 0.78  GFRNONAA >60 >60    LIVER FUNCTION TESTS: Recent Labs    07/06/23 1501 11/03/23 1121  BILITOT 0.5 0.6  AST 22 13*  ALT 29 21  ALKPHOS 55 65  PROT 7.9 7.6  ALBUMIN 4.6 4.5    TUMOR MARKERS: No results for input(s): "AFPTM", "CEA", "CA199", "CHROMGRNA" in the last 8760 hours.  Assessment and Plan:  Request for image guided bone marrow biopsy approved for  11/22/23 with moderate sedation.  - VSS, afebrile - no contraindications for procedure identified in PE or ROS - labs 11/03/23 within acceptable range, CBC w/ repeated today (WNL) - no osseous findings on 11/09/23 PET  - pre-sedation requirements met  Risks and benefits of image guided bone  marrow biopsy and aspiration was discussed with the patient and/or patient's family including, but not limited to bleeding, infection, damage to adjacent structures or low yield requiring additional tests.  All of the questions were answered and there is agreement to proceed.  Consent signed and in chart.   Thank you for allowing our service to participate in Alley Neils 's care.    Electronically Signed: Terressa Fess, NP   11/22/2023, 9:27 AM     I spent a total of  30 Minutes   in face to face in clinical consultation, greater than 50% of which was counseling/coordinating care for image guided bone marrow biopsy and aspiration   (A copy of this note was sent to the referring provider and the time of visit.)

## 2023-11-24 LAB — SURGICAL PATHOLOGY

## 2023-11-25 ENCOUNTER — Telehealth: Payer: Self-pay | Admitting: Physician Assistant

## 2023-11-25 NOTE — Telephone Encounter (Signed)
 I notified Chelsea Day by phone regarding bone marrow biopsy results. Per discussion wioth Dr. Rosaline Coma pathology does not show evidence of lymphoma or mast cell activation syndrome. Patient is scheduled for lymph node biopsy and tonsillectomy on 11/28/23 with Dr. Soldatova. We will follow up with patient once lymph node biopsy pathology is available. If biopsy is negative plan is to refer to Allergist/Immunologist for further work up. All of patient's questions were answered and she expressed understanding of the plan provided.

## 2023-11-27 NOTE — Anesthesia Preprocedure Evaluation (Signed)
 Anesthesia Evaluation  Patient identified by MRN, date of birth, ID band Patient awake    Reviewed: Allergy & Precautions, NPO status , Patient's Chart, lab work & pertinent test results  History of Anesthesia Complications Negative for: history of anesthetic complications  Airway Mallampati: I  TM Distance: >3 FB Neck ROM: Full    Dental no notable dental hx. (+) Teeth Intact, Dental Advisory Given   Pulmonary Patient abstained from smoking., former smoker   Pulmonary exam normal breath sounds clear to auscultation       Cardiovascular hypertension, (-) angina (-) Past MI Normal cardiovascular exam Rhythm:Regular Rate:Normal     Neuro/Psych negative neurological ROS  negative psych ROS   GI/Hepatic negative GI ROS, Neg liver ROS,,,  Endo/Other  negative endocrine ROS    Renal/GU negative Renal ROS     Musculoskeletal   Abdominal   Peds  Hematology   Anesthesia Other Findings All: latex  Mast cell syndrome   Reproductive/Obstetrics                              Anesthesia Physical Anesthesia Plan  ASA: 2  Anesthesia Plan: General   Post-op Pain Management: Toradol  IV (intra-op)* and Tylenol  PO (pre-op)*   Induction: Intravenous  PONV Risk Score and Plan: Treatment may vary due to age or medical condition, Ondansetron , Midazolam  and Dexamethasone   Airway Management Planned: Oral ETT and Nasal ETT  Additional Equipment: None  Intra-op Plan:   Post-operative Plan: Extubation in OR  Informed Consent: I have reviewed the patients History and Physical, chart, labs and discussed the procedure including the risks, benefits and alternatives for the proposed anesthesia with the patient or authorized representative who has indicated his/her understanding and acceptance.     Dental advisory given  Plan Discussed with: CRNA and Surgeon  Anesthesia Plan Comments:          Anesthesia Quick Evaluation

## 2023-11-28 ENCOUNTER — Ambulatory Visit (HOSPITAL_BASED_OUTPATIENT_CLINIC_OR_DEPARTMENT_OTHER): Admitting: Anesthesiology

## 2023-11-28 ENCOUNTER — Encounter (HOSPITAL_BASED_OUTPATIENT_CLINIC_OR_DEPARTMENT_OTHER)
Admission: RE | Disposition: A | Discharge status: Home / Self Care | Payer: Self-pay | Source: Home / Self Care | Attending: Otolaryngology

## 2023-11-28 ENCOUNTER — Ambulatory Visit (HOSPITAL_BASED_OUTPATIENT_CLINIC_OR_DEPARTMENT_OTHER)
Admission: RE | Admit: 2023-11-28 | Discharge: 2023-11-28 | Disposition: A | Discharge status: Home / Self Care | Attending: Otolaryngology | Admitting: Otolaryngology

## 2023-11-28 ENCOUNTER — Other Ambulatory Visit: Payer: Self-pay

## 2023-11-28 ENCOUNTER — Encounter (HOSPITAL_BASED_OUTPATIENT_CLINIC_OR_DEPARTMENT_OTHER): Payer: Self-pay

## 2023-11-28 DIAGNOSIS — R590 Localized enlarged lymph nodes: Secondary | ICD-10-CM | POA: Diagnosis not present

## 2023-11-28 DIAGNOSIS — J3501 Chronic tonsillitis: Secondary | ICD-10-CM

## 2023-11-28 DIAGNOSIS — Z87891 Personal history of nicotine dependence: Secondary | ICD-10-CM | POA: Diagnosis not present

## 2023-11-28 DIAGNOSIS — J351 Hypertrophy of tonsils: Secondary | ICD-10-CM | POA: Diagnosis not present

## 2023-11-28 DIAGNOSIS — J0391 Acute recurrent tonsillitis, unspecified: Secondary | ICD-10-CM | POA: Insufficient documentation

## 2023-11-28 DIAGNOSIS — Z01818 Encounter for other preprocedural examination: Secondary | ICD-10-CM

## 2023-11-28 HISTORY — DX: Generalized enlarged lymph nodes: R59.1

## 2023-11-28 HISTORY — PX: TONSILLECTOMY: SHX5217

## 2023-11-28 HISTORY — PX: LYMPH NODE BIOPSY: SHX201

## 2023-11-28 LAB — POCT PREGNANCY, URINE: Preg Test, Ur: NEGATIVE

## 2023-11-28 SURGERY — TONSILLECTOMY
Anesthesia: General | Site: Throat | Laterality: Right

## 2023-11-28 MED ORDER — PHENYLEPHRINE 80 MCG/ML (10ML) SYRINGE FOR IV PUSH (FOR BLOOD PRESSURE SUPPORT)
PREFILLED_SYRINGE | INTRAVENOUS | Status: AC
  Filled 2023-11-28: qty 20

## 2023-11-28 MED ORDER — FENTANYL CITRATE (PF) 250 MCG/5ML IJ SOLN
INTRAMUSCULAR | Status: DC | PRN
Start: 2023-11-28 — End: 2023-11-28
  Administered 2023-11-28 (×2): 50 ug via INTRAVENOUS

## 2023-11-28 MED ORDER — LIDOCAINE 2% (20 MG/ML) 5 ML SYRINGE
INTRAMUSCULAR | Status: AC
  Filled 2023-11-28: qty 5

## 2023-11-28 MED ORDER — OXYCODONE HCL 5 MG PO TABS: 5.0000 mg | ORAL_TABLET | Freq: Four times a day (QID) | ORAL | 0 refills | Status: DC | PRN

## 2023-11-28 MED ORDER — ROCURONIUM BROMIDE 10 MG/ML (PF) SYRINGE
PREFILLED_SYRINGE | INTRAVENOUS | Status: DC | PRN
  Administered 2023-11-28: 70 mg via INTRAVENOUS

## 2023-11-28 MED ORDER — CEFAZOLIN SODIUM-DEXTROSE 3-4 GM/150ML-% IV SOLN
INTRAVENOUS | Status: AC
  Filled 2023-11-28: qty 150

## 2023-11-28 MED ORDER — FENTANYL CITRATE (PF) 100 MCG/2ML IJ SOLN
INTRAMUSCULAR | Status: AC
  Filled 2023-11-28: qty 2

## 2023-11-28 MED ORDER — HYDROMORPHONE HCL 1 MG/ML IJ SOLN
INTRAMUSCULAR | Status: AC
  Filled 2023-11-28: qty 0.5

## 2023-11-28 MED ORDER — OXYCODONE HCL 5 MG/5ML PO SOLN
ORAL | Status: AC
  Filled 2023-11-28: qty 5

## 2023-11-28 MED ORDER — KETOROLAC TROMETHAMINE 30 MG/ML IJ SOLN
30.0000 mg | Freq: Once | INTRAMUSCULAR | Status: AC | PRN
  Administered 2023-11-28: 30 mg via INTRAVENOUS

## 2023-11-28 MED ORDER — SCOPOLAMINE 1 MG/3DAYS TD PT72
MEDICATED_PATCH | TRANSDERMAL | Status: AC
  Filled 2023-11-28: qty 1

## 2023-11-28 MED ORDER — PHENYLEPHRINE HCL-NACL 20-0.9 MG/250ML-% IV SOLN
INTRAVENOUS | Status: DC | PRN
Start: 2023-11-28 — End: 2023-11-28
  Administered 2023-11-28: 160 ug via INTRAVENOUS
  Administered 2023-11-28: 80 ug via INTRAVENOUS
  Administered 2023-11-28: 120 ug via INTRAVENOUS
  Administered 2023-11-28: 40 ug via INTRAVENOUS
  Administered 2023-11-28: 80 ug via INTRAVENOUS
  Administered 2023-11-28 (×4): 160 ug via INTRAVENOUS
  Administered 2023-11-28: 80 ug via INTRAVENOUS
  Administered 2023-11-28: 160 ug via INTRAVENOUS

## 2023-11-28 MED ORDER — EPHEDRINE SULFATE (PRESSORS) 50 MG/ML IJ SOLN
INTRAMUSCULAR | Status: DC | PRN
Start: 2023-11-28 — End: 2023-11-28
  Administered 2023-11-28: 12.5 mg via INTRAVENOUS
  Administered 2023-11-28: 5 mg via INTRAVENOUS
  Administered 2023-11-28: 7.5 mg via INTRAVENOUS

## 2023-11-28 MED ORDER — ACETAMINOPHEN 500 MG PO TABS
ORAL_TABLET | ORAL | Status: AC
  Filled 2023-11-28: qty 2

## 2023-11-28 MED ORDER — OXYCODONE HCL 5 MG/5ML PO SOLN
5.0000 mg | Freq: Once | ORAL | Status: AC | PRN
  Administered 2023-11-28: 5 mg via ORAL

## 2023-11-28 MED ORDER — DEXAMETHASONE SODIUM PHOSPHATE 10 MG/ML IJ SOLN
INTRAMUSCULAR | Status: AC
  Filled 2023-11-28: qty 1

## 2023-11-28 MED ORDER — LACTATED RINGERS IV SOLN: INTRAVENOUS | Status: DC

## 2023-11-28 MED ORDER — MIDAZOLAM HCL 5 MG/5ML IJ SOLN
INTRAMUSCULAR | Status: DC | PRN
  Administered 2023-11-28: 2 mg via INTRAVENOUS

## 2023-11-28 MED ORDER — ACETAMINOPHEN 500 MG PO TABS: 500.0000 mg | ORAL_TABLET | Freq: Four times a day (QID) | ORAL | 0 refills | Status: DC

## 2023-11-28 MED ORDER — LIDOCAINE-EPINEPHRINE 1 %-1:100000 IJ SOLN
INTRAMUSCULAR | Status: DC | PRN
  Administered 2023-11-28: 3 mL

## 2023-11-28 MED ORDER — ONDANSETRON HCL 4 MG/2ML IJ SOLN
INTRAMUSCULAR | Status: DC | PRN
  Administered 2023-11-28: 4 mg via INTRAVENOUS

## 2023-11-28 MED ORDER — LIDOCAINE 2% (20 MG/ML) 5 ML SYRINGE
INTRAMUSCULAR | Status: DC | PRN
  Administered 2023-11-28: 100 mg via INTRAVENOUS

## 2023-11-28 MED ORDER — HYDROMORPHONE HCL 1 MG/ML IJ SOLN
INTRAMUSCULAR | Status: AC
Start: 2023-11-28 — End: ?
  Filled 2023-11-28: qty 0.5

## 2023-11-28 MED ORDER — PROPOFOL 10 MG/ML IV BOLUS
INTRAVENOUS | Status: DC | PRN
  Administered 2023-11-28: 200 mg via INTRAVENOUS

## 2023-11-28 MED ORDER — OXYCODONE HCL 5 MG PO TABS: 5.0000 mg | ORAL_TABLET | Freq: Once | ORAL | Status: AC | PRN

## 2023-11-28 MED ORDER — MIDAZOLAM HCL 2 MG/2ML IJ SOLN
INTRAMUSCULAR | Status: AC
Start: 2023-11-28 — End: ?
  Filled 2023-11-28: qty 2

## 2023-11-28 MED ORDER — CEFAZOLIN SODIUM-DEXTROSE 1-4 GM/50ML-% IV SOLN
1.0000 g | Freq: Three times a day (TID) | INTRAVENOUS | Status: DC
  Administered 2023-11-28: 3 g via INTRAVENOUS

## 2023-11-28 MED ORDER — ACETAMINOPHEN 500 MG PO TABS
1000.0000 mg | ORAL_TABLET | Freq: Once | ORAL | Status: AC
  Administered 2023-11-28: 1000 mg via ORAL

## 2023-11-28 MED ORDER — ONDANSETRON HCL 4 MG/2ML IJ SOLN
INTRAMUSCULAR | Status: AC
  Filled 2023-11-28: qty 2

## 2023-11-28 MED ORDER — KETOROLAC TROMETHAMINE 30 MG/ML IJ SOLN
INTRAMUSCULAR | Status: AC
  Filled 2023-11-28: qty 1

## 2023-11-28 MED ORDER — DEXAMETHASONE SODIUM PHOSPHATE 10 MG/ML IJ SOLN
INTRAMUSCULAR | Status: DC | PRN
  Administered 2023-11-28: 10 mg via INTRAVENOUS

## 2023-11-28 MED ORDER — EPHEDRINE 5 MG/ML INJ
INTRAVENOUS | Status: AC
  Filled 2023-11-28: qty 5

## 2023-11-28 MED ORDER — PHENYLEPHRINE 80 MCG/ML (10ML) SYRINGE FOR IV PUSH (FOR BLOOD PRESSURE SUPPORT)
PREFILLED_SYRINGE | INTRAVENOUS | Status: AC
  Filled 2023-11-28: qty 10

## 2023-11-28 MED ORDER — 0.9 % SODIUM CHLORIDE (POUR BTL) OPTIME
TOPICAL | Status: DC | PRN
  Administered 2023-11-28: 1000 mL

## 2023-11-28 MED ORDER — ONDANSETRON HCL 4 MG/2ML IJ SOLN: 4.0000 mg | Freq: Once | INTRAMUSCULAR | Status: DC | PRN

## 2023-11-28 MED ORDER — PROPOFOL 10 MG/ML IV BOLUS
INTRAVENOUS | Status: AC
  Filled 2023-11-28: qty 20

## 2023-11-28 MED ORDER — ROCURONIUM BROMIDE 10 MG/ML (PF) SYRINGE
PREFILLED_SYRINGE | INTRAVENOUS | Status: AC
  Filled 2023-11-28: qty 10

## 2023-11-28 MED ORDER — SCOPOLAMINE 1 MG/3DAYS TD PT72
1.0000 | MEDICATED_PATCH | TRANSDERMAL | Status: DC
Start: 2023-11-28 — End: 2023-11-28
  Administered 2023-11-28: 1.5 mg via TRANSDERMAL

## 2023-11-28 MED ORDER — SUGAMMADEX SODIUM 200 MG/2ML IV SOLN
INTRAVENOUS | Status: DC | PRN
  Administered 2023-11-28: 200 mg via INTRAVENOUS

## 2023-11-28 MED ORDER — HYDROMORPHONE HCL 1 MG/ML IJ SOLN
0.2500 mg | INTRAMUSCULAR | Status: DC | PRN
  Administered 2023-11-28 (×2): 0.5 mg via INTRAVENOUS

## 2023-11-28 MED ORDER — IBUPROFEN 600 MG PO TABS: 600.0000 mg | ORAL_TABLET | Freq: Four times a day (QID) | ORAL | 0 refills | Status: DC

## 2023-11-28 SURGICAL SUPPLY — 74 items
BAG DECANTER FOR FLEXI CONT (MISCELLANEOUS) IMPLANT
BAND RUBBER #18 3X1/16 STRL (MISCELLANEOUS) IMPLANT
BENZOIN TINCTURE PRP APPL 2/3 (GAUZE/BANDAGES/DRESSINGS) IMPLANT
BLADE CLIPPER SURG (BLADE) IMPLANT
BLADE SURG 15 STRL LF DISP TIS (BLADE) IMPLANT
CANISTER SUCT 1200ML W/VALVE (MISCELLANEOUS) ×4 IMPLANT
CATH ROBINSON RED A/P 10FR (CATHETERS) IMPLANT
CATH ROBINSON RED A/P 12FR (CATHETERS) ×2 IMPLANT
CLEANER CAUTERY TIP PAD (MISCELLANEOUS) ×2 IMPLANT
COAGULATOR SUCT 8FR VV (MISCELLANEOUS) IMPLANT
COAGULATOR SUCT SWTCH 10FR 6 (ELECTROSURGICAL) ×2 IMPLANT
CORD BIPOLAR FORCEPS 12FT (ELECTRODE) IMPLANT
COVER BACK TABLE 60X90IN (DRAPES) ×4 IMPLANT
COVER MAYO STAND STRL (DRAPES) ×4 IMPLANT
DEFOGGER MIRROR 1QT (MISCELLANEOUS) ×2 IMPLANT
DERMABOND ADVANCED .7 DNX12 (GAUZE/BANDAGES/DRESSINGS) IMPLANT
DRAPE INCISE IOBAN 66X45 STRL (DRAPES) IMPLANT
DRAPE U-SHAPE 76X120 STRL (DRAPES) IMPLANT
DRSG TEGADERM 2-3/8X2-3/4 SM (GAUZE/BANDAGES/DRESSINGS) IMPLANT
ELECT COATED BLADE 2.86 ST (ELECTRODE) ×2 IMPLANT
ELECT NDL TIP 2.8 STRL (NEEDLE) IMPLANT
ELECT NEEDLE TIP 2.8 STRL (NEEDLE) IMPLANT
ELECTRODE PAIRED SUBDERMAL (MISCELLANEOUS) IMPLANT
ELECTRODE REM PT RETRN 9FT PED (ELECTROSURGICAL) IMPLANT
ELECTRODE REM PT RTRN 9FT ADLT (ELECTROSURGICAL) ×2 IMPLANT
FORCEPS BIPOLAR SPETZLER 8 1.0 (NEUROSURGERY SUPPLIES) IMPLANT
GAUZE 4X4 16PLY ~~LOC~~+RFID DBL (SPONGE) IMPLANT
GAUZE SPONGE 4X4 12PLY STRL (GAUZE/BANDAGES/DRESSINGS) IMPLANT
GAUZE SPONGE 4X4 12PLY STRL LF (GAUZE/BANDAGES/DRESSINGS) ×4 IMPLANT
GLOVE BIO SURGEON STRL SZ7.5 (GLOVE) IMPLANT
GLOVE SURG SS PI 6.0 STRL IVOR (GLOVE) ×4 IMPLANT
GOWN STRL REUS W/ TWL LRG LVL3 (GOWN DISPOSABLE) ×8 IMPLANT
HEMOSTAT ARISTA ABSORB 3G PWDR (HEMOSTASIS) IMPLANT
HEMOSTAT SURGICEL .5X2 ABSORB (HEMOSTASIS) IMPLANT
MANIFOLD NEPTUNE II (INSTRUMENTS) ×2 IMPLANT
MARKER SKIN DUAL TIP RULER LAB (MISCELLANEOUS) IMPLANT
NDL PRECISIONGLIDE 27X1.5 (NEEDLE) IMPLANT
NEEDLE PRECISIONGLIDE 27X1.5 (NEEDLE) ×2 IMPLANT
NS IRRIG 1000ML POUR BTL (IV SOLUTION) ×4 IMPLANT
PACK BASIN DAY SURGERY FS (CUSTOM PROCEDURE TRAY) IMPLANT
PENCIL SMOKE EVACUATOR (MISCELLANEOUS) ×2 IMPLANT
PROBE NERVBE PRASS .33 (MISCELLANEOUS) IMPLANT
SHEARS HARMONIC 9CM CVD (BLADE) IMPLANT
SHEET MEDIUM DRAPE 40X70 STRL (DRAPES) ×2 IMPLANT
SLEEVE SCD COMPRESS KNEE MED (STOCKING) ×2 IMPLANT
SPIKE FLUID TRANSFER (MISCELLANEOUS) IMPLANT
SPONGE INTESTINAL PEANUT (DISPOSABLE) IMPLANT
SPONGE TONSIL 1 RF SGL (DISPOSABLE) IMPLANT
SPONGE TONSIL 1.25 RF SGL STRG (GAUZE/BANDAGES/DRESSINGS) IMPLANT
STAPLER SKIN PROX WIDE 3.9 (STAPLE) IMPLANT
STRIP CLOSURE SKIN 1/2X4 (GAUZE/BANDAGES/DRESSINGS) IMPLANT
STRIP CLOSURE SKIN 1/4X4 (GAUZE/BANDAGES/DRESSINGS) IMPLANT
SUCTION TUBE FRAZIER 10FR DISP (SUCTIONS) IMPLANT
SUT MNCRL AB 4-0 PS2 18 (SUTURE) IMPLANT
SUT MON AB 4-0 PS1 27 (SUTURE) IMPLANT
SUT SILK 3 0 REEL (SUTURE) IMPLANT
SUT SILK 3-0 18XBRD TIE BLK (SUTURE) IMPLANT
SUT SILK 4 0 TIES 17X18 (SUTURE) IMPLANT
SUT VIC AB 3-0 FS2 27 (SUTURE) IMPLANT
SUT VIC AB 3-0 SH 27X BRD (SUTURE) IMPLANT
SUT VIC AB 4-0 PS2 18 (SUTURE) IMPLANT
SWAB COLLECTION DEVICE MRSA (MISCELLANEOUS) IMPLANT
SWAB CULTURE ESWAB REG 1ML (MISCELLANEOUS) IMPLANT
SYR BULB EAR ULCER 3OZ GRN STR (SYRINGE) ×2 IMPLANT
SYR CONTROL 10ML LL (SYRINGE) IMPLANT
TAPE CLOTH 3X10 WHT NS LF (GAUZE/BANDAGES/DRESSINGS) ×2 IMPLANT
TAPE PAPER 3X10 WHT MICROPORE (GAUZE/BANDAGES/DRESSINGS) IMPLANT
TOWEL GREEN STERILE FF (TOWEL DISPOSABLE) ×4 IMPLANT
TRAY DSU PREP LF (CUSTOM PROCEDURE TRAY) IMPLANT
TUBE CONNECTING 20X1/4 (TUBING) ×4 IMPLANT
TUBE SALEM SUMP 12FR 48 (TUBING) IMPLANT
TUBE SALEM SUMP 16F (TUBING) IMPLANT
WAND COBLATOR 70 EVAC XTRA (SURGICAL WAND) IMPLANT
YANKAUER SUCT BULB TIP NO VENT (SUCTIONS) ×2 IMPLANT

## 2023-11-28 NOTE — Transfer of Care (Signed)
 Immediate Anesthesia Transfer of Care Note  Patient: Chelsea Day  Procedure(s) Performed: TONSILLECTOMY (Bilateral: Throat) LYMPH NODE BIOPSY (Right: Neck)  Patient Location: PACU  Anesthesia Type:General  Level of Consciousness: drowsy  Airway & Oxygen Therapy: Patient Spontanous Breathing and Patient connected to face mask oxygen  Post-op Assessment: Report given to RN and Post -op Vital signs reviewed and stable  Post vital signs: Reviewed and stable  Last Vitals:  Vitals Value Taken Time  BP 103/62 11/28/23 1110  Temp    Pulse 93 11/28/23 1114  Resp 21 11/28/23 1114  SpO2 100 % 11/28/23 1114  Vitals shown include unfiled device data.  Last Pain:  Vitals:   11/28/23 0806  TempSrc:   PainSc: 0-No pain         Complications: No notable events documented.

## 2023-11-28 NOTE — Discharge Instructions (Addendum)
 Post-Operative Instructions (Tonsillectomy)  What to Expect: - It is common to have a sore throat for several days after surgery from the breathing tube. - You can eat and drink anything you normally do - there are no restrictions due to surgery alone.  If you have been recommended any other type of diet, such as an acid reflux diet, you should continue that.  You may want to eat light meals the day of anesthesia to make sure you don't get nauseated. - Take scheduled Tylenol  and Motrin  every 6 hrs staggered 3 hrs apart from each other. If your pain is still severe while doing that, and it will be the first 2-3 days, take Oxycodone  - It is very important that you stay well-hydrated and drink plenty of fluids during the recovery period.  Getting dehydrated tends to worsen post-operative pain. - After tonsillectomy, you should avoid any exercise or activity that gets your heart rate up for at least 10 days after surgery. - After tonsillectomy, you need to watch out for bleeding from the back of your mouth where the tonsils were; this can occur as a brief episode of bleeding that stops on its own, or as more serious or persistent bleeding.  If it is persistent, you should have someone drive you to the [facility] immediately and call the numbers above on the way to notify us .   - If the bleeding is brief and stops on its own, you can gargle cold water and call my team at the numbers above for further instructions. - You should avoid lifting anything heavier than a gallon of milk for 2 weeks. - Call the office if you experience any of the following: Fever higher than 101 F Difficulty breathing or swallowing Bleeding that occurs suddenly or briskly and won't stop, or any other concerning bleeding Sharp increase in pain - note that with tonsillectomy, a sharp increase in pain is normal around day 5-7 after surgery. It is also normal for pain to be much worse in the mornings when you wake up. Nausea and  Vomiting  Take Zofran  if you experience nausea. It is important to take pain medications with some food in your stomach to avoid nausea and vomiting  You should contact Dr Adelia Adolphus office at [phone] if you need to be seen or if you experience any of the above symptoms.   You can shower starting tomorrow. You can take down the dressing on the neck wound tomorrow. Do not scrub the incision. Gently pad it dry. Avoid strenuous activities until you post-op follow-up.    Post Anesthesia Home Care Instructions  Activity: Get plenty of rest for the remainder of the day. A responsible individual must stay with you for 24 hours following the procedure.  For the next 24 hours, DO NOT: -Drive a car -Advertising copywriter -Drink alcoholic beverages -Take any medication unless instructed by your physician -Make any legal decisions or sign important papers.  Meals: Start with liquid foods such as gelatin or soup. Progress to regular foods as tolerated. Avoid greasy, spicy, heavy foods. If nausea and/or vomiting occur, drink only clear liquids until the nausea and/or vomiting subsides. Call your physician if vomiting continues.  Special Instructions/Symptoms: Your throat may feel dry or sore from the anesthesia or the breathing tube placed in your throat during surgery. If this causes discomfort, gargle with warm salt water. The discomfort should disappear within 24 hours.  If you had a scopolamine  patch placed behind your ear for the  management of post- operative nausea and/or vomiting:  1. The medication in the patch is effective for 72 hours, after which it should be removed.  Wrap patch in a tissue and discard in the trash. Wash hands thoroughly with soap and water. 2. You may remove the patch earlier than 72 hours if you experience unpleasant side effects which may include dry mouth, dizziness or visual disturbances. 3. Avoid touching the patch. Wash your hands with soap and water after  contact with the patch.    Next dose of tylenol  at 2:00pm

## 2023-11-28 NOTE — Anesthesia Procedure Notes (Signed)
 Procedure Name: Intubation Date/Time: 11/28/2023 9:18 AM  Performed by: Adolphus Hoops, CRNAPre-anesthesia Checklist: Patient identified, Emergency Drugs available, Suction available and Patient being monitored Patient Re-evaluated:Patient Re-evaluated prior to induction Oxygen Delivery Method: Circle System Utilized Preoxygenation: Pre-oxygenation with 100% oxygen Induction Type: IV induction Ventilation: Mask ventilation without difficulty Laryngoscope Size: Mac and 3 Grade View: Grade I Tube type: Oral Tube size: 7.0 mm Number of attempts: 1 Airway Equipment and Method: Stylet and Oral airway Placement Confirmation: ETT inserted through vocal cords under direct vision, positive ETCO2 and breath sounds checked- equal and bilateral Secured at: 22 cm Tube secured with: Tape Dental Injury: Teeth and Oropharynx as per pre-operative assessment

## 2023-11-28 NOTE — Anesthesia Postprocedure Evaluation (Signed)
 Anesthesia Post Note  Patient: Persephanie Brimberry  Procedure(s) Performed: TONSILLECTOMY (Bilateral: Throat) LYMPH NODE BIOPSY (Right: Neck)     Patient location during evaluation: PACU Anesthesia Type: General Level of consciousness: awake and alert Pain management: pain level controlled Vital Signs Assessment: post-procedure vital signs reviewed and stable Respiratory status: spontaneous breathing, nonlabored ventilation, respiratory function stable and patient connected to nasal cannula oxygen Cardiovascular status: blood pressure returned to baseline and stable Postop Assessment: no apparent nausea or vomiting Anesthetic complications: no  No notable events documented.  Last Vitals:  Vitals:   11/28/23 1141 11/28/23 1158  BP: (!) 126/57 136/78  Pulse: 74 92  Resp: 12 20  Temp:  36.4 C  SpO2: 97% 96%    Last Pain:  Vitals:   11/28/23 1158  TempSrc: Temporal  PainSc:                  Rosalita Combe

## 2023-11-28 NOTE — Op Note (Signed)
 OPERATIVE REPORT   Preoperative Diagnosis:  Tonsillar Hypertrophy  Cervical Lymphadenopathy  PET/CT FDG avid tonsils and cervical lymph nodes   Postoperative Diagnosis: Recurrent Tonsillitis, Tonsillar Hypertrophy   Procedure: Tonsillectomy and excisional biopsy of the right level 2 cervical lymph nodes   Surgeon: Artice Last, MD   Assisstant: Lorane Rocker, PA-C   IVF: per anesthesia    Anesthesia: General  Specimens: Right cervical lymph nodes level 2  2.  Right tonsil  3.  Left tonsil      Preoperative Indication:  28 year-old female has been seen in the office for persistent cervical lymphadenopathy tonsillar hypertrophy and FDG avid uptake of both tonsils and cervical lymph nodes on a recent PET/CT. She was referred by Oncology to rule out malignancy. Risks and Benefits of surgery were discussed with the patient and she elected to proceed.    Operative Procedure: The patient was correctly identified and brought to the operating room and placed in supine position. An endotracheal tube was placed without difficulty and the head of the bed was rotated 90 degrees with respect to anestheisa. After a surgeon time out, we prepped and draped the patient in a standard fashion.   We first proceeded with the excisional lymph node biopsy of the right level 2 lymph nodes which appeared to be most prominent in size on CT neck (up to 2 cm in size). A 4 cm horizontal incision was made along the area where the nodes were palpated. After dividing skin and underlying tissues with a #15 lade, superior and inferior sub platysmal flaps were elevated. Next we exposed the anterior border of the sternocleidomastoid and using blunt dissection, identified two small cervical lymph nodes along the right jugular vein. Once the lymph nodes were identified, we carefully dissected around the lymph nodes and removed them. Two excised lymph nodes were sent for permanent histopathology and flow cytometry. We  then ensured good hemostasis with Bipolar cautery. A Valsalva maneuver was performed and there was no evidence of bleeding. A small piece of Surgicel was placed into the wound after it was thoroughly irrigated. We the closed the edges of the incision using 4-0 Vicryl sutures for deep and 5-0 Biopsy suture for skin approximation. A Dermabond was applied.   We then proceeded with tonsillectomy portion of the procedure A Crowe- Davis mouth gag was used to expose the oropharynx. A red rubber catheter was not placed due to patient's allergy to Latex. A curved Allis was used to grasp the right and left tonsils in an consecutive fashion. A Bovie was used to gently dissect the tonsils away from the surrounding muscular pillars. Hemostasis was obtained along the way. The mouth gag was let down for 3 minutes to evaluate for any bleeding. The oropharynx was copiously irrigated with saline and suctioned. The patient was returned to anesthesia and awoken in stable condition.  The advanced practice practitioner (APP) assisted throughout the case. The APP was essential in retraction and counter traction when needed to make the case progress smoothly. This retraction and assistance made it possible to see the tissue planes for the procedure. The assistance was needed for blood control, tissue re-approximation and assisted with closure of the incision site

## 2023-11-28 NOTE — Interval H&P Note (Signed)
 History and Physical Interval Note:  11/28/2023 8:40 AM  Chelsea Day  has presented today for surgery, with the diagnosis of Tonsillar hypertrophy.  The various methods of treatment have been discussed with the patient and family. After consideration of risks, benefits and other options for treatment, the patient has consented to  Procedure(s): TONSILLECTOMY (Bilateral) LYMPH NODE BIOPSY (N/A) as a surgical intervention.  The patient's history has been reviewed, patient examined, no change in status, stable for surgery.  I have reviewed the patient's chart and labs.  Questions were answered to the patient's satisfaction.     Becky Berberian

## 2023-11-29 ENCOUNTER — Encounter (HOSPITAL_BASED_OUTPATIENT_CLINIC_OR_DEPARTMENT_OTHER): Payer: Self-pay | Admitting: Otolaryngology

## 2023-11-30 ENCOUNTER — Encounter (HOSPITAL_COMMUNITY): Payer: Self-pay | Admitting: Physician Assistant

## 2023-12-01 ENCOUNTER — Encounter: Payer: Self-pay | Admitting: Physician Assistant

## 2023-12-01 DIAGNOSIS — R591 Generalized enlarged lymph nodes: Secondary | ICD-10-CM

## 2023-12-01 LAB — SURGICAL PATHOLOGY

## 2023-12-02 ENCOUNTER — Telehealth: Payer: Self-pay | Admitting: Physician Assistant

## 2023-12-02 ENCOUNTER — Telehealth (INDEPENDENT_AMBULATORY_CARE_PROVIDER_SITE_OTHER): Payer: Self-pay

## 2023-12-02 LAB — SURGICAL PATHOLOGY

## 2023-12-02 NOTE — Telephone Encounter (Signed)
 Late entry  Called patient yesterday at 1630 to discuss lymph node biopsy results. Patient is still hoarse from her tonsillectomy and preferred a MyChart message with results be sent.  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 Per discussion with Dr. Rosaline Coma we will refer patient to immunology and rheumatology for further evaluation. Work up with [facility] did not have any findings of malignancy. Referrals have been faxed to [facility] and [facility]. We will also fax work up from [facility] to PCP. Patient agreeable with plan and has no further questions.

## 2023-12-02 NOTE — Telephone Encounter (Signed)
 I left a message with the normal biopsy result

## 2023-12-02 NOTE — Telephone Encounter (Signed)
-----   Message from [place] sent at 12/02/2023 10:51 AM EDT ----- Surgical pathology results were negative for cancer or lymphoma - for both the lymph node biopsy and tonsillectomy - could you pls let her know? Thnx ----- Message ----- From: Interface, Lab In [place] Sent: 12/01/2023   1:48 PM EDT To: Artice Last, MD

## 2023-12-05 ENCOUNTER — Ambulatory Visit (INDEPENDENT_AMBULATORY_CARE_PROVIDER_SITE_OTHER): Admitting: Otolaryngology

## 2023-12-05 ENCOUNTER — Telehealth (INDEPENDENT_AMBULATORY_CARE_PROVIDER_SITE_OTHER): Payer: Self-pay

## 2023-12-05 ENCOUNTER — Encounter: Payer: Self-pay | Admitting: Medical Oncology

## 2023-12-05 ENCOUNTER — Encounter (INDEPENDENT_AMBULATORY_CARE_PROVIDER_SITE_OTHER): Payer: Self-pay | Admitting: Otolaryngology

## 2023-12-05 VITALS — BP 128/84 | HR 92

## 2023-12-05 DIAGNOSIS — R590 Localized enlarged lymph nodes: Secondary | ICD-10-CM

## 2023-12-05 DIAGNOSIS — Z9889 Other specified postprocedural states: Secondary | ICD-10-CM

## 2023-12-05 DIAGNOSIS — J351 Hypertrophy of tonsils: Secondary | ICD-10-CM

## 2023-12-05 MED ORDER — METHYLPREDNISOLONE 4 MG PO TBPK: ORAL_TABLET | ORAL | 1 refills | Status: DC

## 2023-12-05 NOTE — Progress Notes (Signed)
 [facility]) for Malignancy  Hand-off Note  12/05/23 2:34 PM  Chelsea Day 05/11/1996 [id number]  Cancer Care, Care Team: Jacquelyn Matt. MD Rosalind Columbia, [place] Jakes, [facility] Nurse Navigator  [name] was referred to Cancer Care on 11/01/2023 for evaluation of: lymphadenopathy.  The patient's diagnostic work-up included: Labs NM PET Image IR Bone Marrow Biopsy & Aspiration  The patient was found to not have malignancy at this time, as evaluated for the reason for referral stated above.  The recommended follow-up provided to the patient includes: referrals to Immunology and Rheumatology, both made on 12/02/2023 .  We thank you for allowing us  to assist in [facility] care.  The initial and most recent Progress Notes, labs, imaging, procedure(s), and/or consult notes have been routed to you through [facility] or faxed to your office for continuity of care.   Esperanza Hedges, RN, BSN, [facility] Nurse Navigator, [facility] 12/05/2023 2:36 PM

## 2023-12-05 NOTE — Progress Notes (Unsigned)
 ENT Progress Note:   Update 12/06/2023  Discussed the use of AI scribe software for clinical note transcription with the patient, who gave verbal consent to proceed.  History of Present Illness   Chelsea Day is a 28 year old female who presents with persistent swelling at the site of a previous surgery. She had tonsillectomy and right cervical lymph node excisional biopsy on 11/28/23. Final pathology for both tonsils and the lymph node was negative for lymphoma or malignancy.   She has persistent swelling at the site of right cervical lymph node excision, described as a ' hard ball' that has not decreased in size. There is a sensation of tightness in the area, but no significant pain or shortness of breath.  She experiences pain with swallowing since the tonsillectomy, although she is able to drink fluids and manage her secretions. No fever has been experienced, and she has not been on antibiotics or steroids previously.  Her lymph nodes have been a recurring issue, with previous bone marrow biopsy negative for mast cell activation sy. A family member has similar issues with lymph nodes that swell and resolve within a week.   Records Reviewed:  Initial Evaluation  Reason for Consult: neck pain and cervical lymphadenopathy   HPI: Discussed the use of AI scribe software for clinical note transcription with the patient, who gave verbal consent to proceed.  History of Present Illness Chelsea Day is a 28 year old female who presents with swollen lymph nodes and tonsils. She was referred by a screening clinic for concerning findings on imaging and exam.  She presents with swollen lymph nodes and tonsils, initially identified through imaging and exam at a screening clinic. An MRI revealed swollen lymph nodes and tonsils, and a PET scan had uptake in the R and L tonsil/mild uptake of the level 2 nodes. Blood work showed WBC 16, prompting further investigation.  She experienced pain in  her chest and the right side of her neck, radiating down her arm. The chest discomfort is located in the right upper chest, and the neck pain is on the right side. She describes feeling generally unwell, with pain particularly in her neck, and had spine MRI, with mildly enlarged b/l cervical nodes.   She has noticed a sensation of something in the back of her throat, which has persisted since the end of last year. She describes difficulty swallowing, likening it to the sensation of a pill being stuck. No sore throat and she has not had her tonsils removed. She has a rash and reports generalized fatigue.   Over the past couple of years, she has experienced intermittent skin flushing and, more recently, increased fatigue and night sweats. These symptoms have contributed to her feeling generally unwell.  She has tried antihistamines for her rash without relief. She has been seeing a cancer center for her workup related to mast cell activation sy and carcinoid is also considered. A bone marrow biopsy is scheduled in two weeks to investigate potential mast cell activation sy.  Records Reviewed:  Heme/Onc Office visit 11/03/23: Chelsea Day 28 y.o. female with medical history significant for preeclampsia, hypertension, anxiety, right salpingo oophorectomy.   On review of the previous records patient is being referred by PCP Darnelle Elders, PA-C. Patient presented to the emergency room on 10/19/2023 for acute onset of right sided neck pain with numbness and tingling down her right arm down to her 3rd, 4th and 5th digit. DVT study was negative. EDP's note includes concern for  cervical radiculopathy and recommend outpatient MRI.  Patient was started on gabapentin  and prednisone .  MRI of cervical spine performed on 10/22/2023 revealed mildly enlarged bilateral cervical lymph nodes and mild asymmetric enlargement of the right palatine tonsil. CT soft tissue neck was performed on 10/31/2023 and the result shows  symmetric rate enlarged and nonspecific bilateral level 2A nodes, 15 mm short axis and 2 to 3 cm long axis. Radiologist also commented on mild postinflammatory calcifications of the left palatine tonsil. CT chest was performed 11/01/23 for complaint of chest pressure and was negative for acute infectious process and no lymphadenopathy was seen. Per PCP note patient also had an x-ray done by her chiropractor and orthopedist prior to the original  ED visit.  Labs from PCP visit on 10/28/2023 showed elevated WBC 16.3, elevated neutrophil # 9.3 and lymphocytes  #5.7. Patient had completed prednisone  course x 2 days prior to lab work. Patient was also started on Xyzal  at recent PCP visit. Further chart review shows patient was worked up in 2022 for concern of mast cell syndrome by Dr. Rosaline Coma.  Labs were overall unremarkable including tryptase and IgE.  It was recommended that patient proceed with bone marrow biopsy. Patient did not proceed with bone marrow biopsy because of the cost. Dr. Les Rao office note states: She also was seen by allergist who found that she was negative for allergies to metals, foods, and other substances. GI evaluated her with concern for carcinoid syndrome was unable to find a clear etiology for her symptoms.    On exam today patient presents unaccompanied. She reports systemic symptoms including night sweats almost every night for the past two to three weeks and flushing episodes nearly ten times a day, affecting her chest, neck, face, arms, and stomach. The flushing is described as 'prickly' with no specific triggers. The flushing started after the birth of her second child and was "under control" and only happening once or twice per day for the last couple of years she states.She also experiences significant fatigue, feeling unwell, and a lack of appetite, leading to unintentional weight loss of approximately 20 pounds since January 2025. Patient adds that she has been consecutively ill  since the start of the year with COVID, Norovirus, and the Flu. Most recently had the flu in the beginning of February. Her PCP started her on Xzyal x 6 days ago. Patient also reports pain in her right clavicle that she describes as bone pain that radiates down her arm. She recent saw neurosurgery for this and is scheduled for an MRI of her elbow on 04/07/2.   She reports that her medical history is significant for her father having lymphoma, maternal aunt with  leukemia, maternal grandmother and great-grandmother with breast cancer and a paternal aunt with colon cancer. Patient admits to remote smoking history for 5 years, from age [age] with 2 packs/week. She currently vapes. She does drink alcohol socially. 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   #Cervical lymphadenopathy #recurrent flushing, bodyaches and fatigue - Reviewed CT neck and chest as well as MRI of cervical spine results with patient. - Labs collected today include CBC, CMP, ESR, CRP, LDH, flow cytometry, Hep B & C serologies, HIV serology. Will also check tryptase level, chromogranin A, metanephrines, and 24 Hour urine 5 HIAA.  - PET scan ordered and scheduled to check FDG activity.  - ENT consult ordered as well for consideration of LN biopsy   #Age related screenings - UTD with  pap smear. Otherwise N/A based on age   Past Medical History:  Diagnosis Date   Bilateral ovarian cysts    Endometriosis determined by laparoscopy    History of postpartum uterine bleeding    Irregular periods/menstrual cycles    Lymphadenopathy    Pregnancy induced hypertension     Past Surgical History:  Procedure Laterality Date   IR BONE MARROW BIOPSY & ASPIRATION  11/22/2023   LAPAROSCOPIC OVARIAN CYSTECTOMY Right 01/24/2015   Procedure: LAPAROSCOPIC LYSIS OF ADHESION, LAPROSCOPIC RIGHT SALPINGOOOPHORECTOMY, BIOPSY OF RIGHT BOARD LIGAMENT; LYSIS BIOPSY OF ADHESIONS, BIOPSY OF LEFT UTERO LIGAMENT;  Surgeon: Lula Sale, MD;  Location: [facility];  Service: Gynecology;  Laterality: Right;   LAPAROSCOPY  11/08/2011   Procedure: LAPAROSCOPY OPERATIVE;  Surgeon: Hamp Levine, MD;  Location: [facility];  Service: Gynecology;  Laterality: Right;   LYMPH NODE BIOPSY Right 11/28/2023   Procedure: LYMPH NODE BIOPSY;  Surgeon: Artice Last, MD;  Location: [facility];  Service: ENT;  Laterality: Right;   OVARIAN CYST REMOVAL  11/08/2011   Procedure: OVARIAN CYSTECTOMY;  Surgeon: Hamp Levine, MD;  Location: [facility];  Service: Gynecology;  Laterality: Right;   TONSILLECTOMY Bilateral 11/28/2023   Procedure: TONSILLECTOMY;  Surgeon: Artice Last, MD;  Location:  S[facility];  Service: ENT;  Laterality: Bilateral;   UNILATERAL SALPINGECTOMY Right 01/24/15    Family History  Problem Relation Age of Onset   Cancer Father        ???   Multiple sclerosis Mother    Colon cancer Maternal Grandmother    Colon cancer Maternal Aunt    Allergic rhinitis Neg Hx    Angioedema Neg Hx    Asthma Neg Hx    Eczema Neg Hx    Immunodeficiency Neg Hx    Urticaria Neg Hx     Social History:  reports that she quit smoking about 10 years ago. Her smoking use included cigarettes. She has never used smokeless tobacco. She reports that she does not drink alcohol and does not use drugs.  Allergies:  Allergies  Allergen Reactions   Avocado Anaphylaxis   Banana Anaphylaxis   Gadolinium Derivatives Anaphylaxis, Hives and Palpitations    Other Reaction(s): Flushing   Iodinated Contrast Media Anaphylaxis    Patient reported chest pressure and difficulty breathing after receiving contrast for PET scan  Other Reaction(s): chest pressure, trouble breathing, facial flushing   Kiwi Extract Anaphylaxis   Latex Rash    Medications: I have reviewed the patient's current medications.  The PMH, PSH, Medications, Allergies, and SH were reviewed and updated.  ROS: Constitutional: Negative for fever,  weight loss and weight gain. Cardiovascular: Negative for chest pain and dyspnea on exertion. Respiratory: Is not experiencing shortness of breath at rest. Gastrointestinal: Negative for nausea and vomiting. Neurological: Negative for headaches. Psychiatric: The patient is not nervous/anxious  Blood pressure 128/84, pulse 92, SpO2 98%. There is no height or weight on file to calculate BMI.  PHYSICAL EXAM:  Exam: General: Well-developed, well-nourished Communication and Voice: Clear pitch and clarity Respiratory Respiratory effort: Equal inspiration and expiration without stridor Cardiovascular Peripheral Vascular: Warm extremities with equal color/perfusion Eyes: No nystagmus with equal extraocular motion bilaterally Neuro/Psych/Balance: Patient oriented to person, place, and time; Appropriate mood and affect; Gait is intact with no imbalance; Cranial nerves I-XII are intact Head and Face Inspection: Normocephalic and atraumatic without mass or lesion Palpation: Facial skeleton intact without bony stepoffs Salivary Glands: No mass or tenderness Facial Strength:  Facial motility symmetric and full bilaterally ENT Pinna: External ear intact and fully developed External canal: Canal is patent with intact skin Tympanic Membrane: Clear and mobile External Nose: No scar or anatomic deformity Internal Nose: Septum is deviated to the left. No polyp, or purulence. Mucosal edema and erythema present.  Bilateral inferior turbinate hypertrophy.  Lips, Teeth, and gums: Mucosa and teeth intact and viable TMJ: No pain to palpation with full mobility Oral cavity/oropharynx: No erythema or exudate, s/p tonsillectomy healing as expected no blood or clot   Neck Neck and Trachea: Midline trachea without mass or lesion Thyroid : No mass or nodularity Lymphatics: excisional bx site R neck incision is c/d/I, no erythema or ecchymosis no fluctuance, above the incision there is a palpable lymph node ~ 1.5  cm in size   Studies Reviewed: CT neck 10/31/23 FINDINGS: Pharynx and larynx: Larynx and pharynx soft tissue contours are within normal limits. There are mild postinflammatory calcifications of the left palatine tonsil. Parapharyngeal and retropharyngeal spaces are negative.   Salivary glands: Negative sublingual space. Submandibular and parotid glands are within normal limits.   Thyroid : Negative.   Lymph nodes: Lymph   Enlarged right level 2A lymph node, 15 mm short axis, approximately 23 mm long axis on series 2, image 48 and sagittal image 45.   Slightly smaller contralateral left level 2A lymph node on series 2, image 47 and sagittal image 87, 12-15 mm short axis.   But other bilateral level 2 lymph nodes are normal, 6-7 mm short axis. No cystic or necrotic lymph node. Bilateral level 1, 3 through 5 nodes are within normal limits.   Vascular: Major vascular structures in the bilateral neck and at the skull base are enhancing and appear to be patent. Left IJ appears dominant. Right vertebral artery appears dominant.   Limited intracranial: Negative.   Visualized orbits: Negative.   Mastoids and visualized paranasal sinuses: Clear bilaterally.   Skeleton: No osseous abnormality identified.   Upper chest:  Negative.  See also chest CT performed following day.   IMPRESSION: Symmetrically enlarged and nonspecific bilateral level 2A nodes, 15 mm short axis and 2-3 cm long axis. Otherwise negative CT appearance of the bilateral neck. Recommend follow-up by clinical exam, with repeat Neck CT (IV contrast preferred) if any area further enlarges or becomes painful.    PET/CT 11/07/23 IMPRESSION: 1. No convincing evidence of lymphoma. 2. Asymmetric hypermetabolic activity in the RIGHT greater than LEFT palatine tonsil. Recommend direct visualization potential sampling. 3. Relatively mild metabolic activity associated with enlarged level 2 cervical lymph nodes. 4. No  evidence adenopathy in the chest abdomen pelvis. 5. Normal spleen and bone marrow.  CT chest 11/01/23 Narrative & Impression  CLINICAL DATA:  Chest pain and pressure, swollen lymph nodes   EXAM: CT CHEST WITHOUT CONTRAST   TECHNIQUE: Multidetector CT imaging of the chest was performed following the standard protocol without IV contrast.   RADIATION DOSE REDUCTION: This exam was performed according to the departmental dose-optimization program which includes automated exposure control, adjustment of the mA and/or kV according to patient size and/or use of iterative reconstruction technique.   COMPARISON:  CT abdomen 10/25/2020   FINDINGS: Cardiovascular: Heart size normal. No pericardial effusion. Central great vessels normal in caliber.   Mediastinum/Nodes: No mediastinal hematoma, mass, or adenopathy.   Lungs/Pleura: No pleural effusion. No pneumothorax. Lungs are clear.   Upper Abdomen: No acute abnormality.   Musculoskeletal: Minimal spurring in the lower thoracic spine. No acute findings.  IMPRESSION: Negative   Labs 7 days ago DIAGNOSIS:   - No abnormal B or T-cell population identified   GATING AND PHENOTYPIC ANALYSIS:   Gated population: Flow cytometric immunophenotyping is performed using  antibodies to the antigens listed in the table below. Electronic gates  are placed around a cell cluster displaying light scatter properties  corresponding to: lymphocytes      Latest Ref Rng & Units 11/22/2023    8:49 AM 11/03/2023   11:21 AM 07/06/2023    3:01 PM  CBC  WBC 4.0 - 10.5 K/uL 9.3  13.7  6.5   Hemoglobin 12.0 - 15.0 g/dL 65.7  84.6  96.2   Hematocrit 36.0 - 46.0 % 40.6  42.0  41.8   Platelets 150 - 400 K/uL 362  324  306    Surg path (tonsils and right cervical lymph node) FINAL MICROSCOPIC DIAGNOSIS:  A. LYMPH NODE, RIGHT LEVEL 2 CERVICAL, EXCISION: -Reactive lymphoid hyperplasia - See comment  COMMENT:  The sections show preservation of the  lymph nodal architecture with open sinuses.  The lymphoid tissue displays numerous variably sized lymphoid follicles with reactive appearing germinal centers.  The paracortex is primarily composed of small lymphoid cells without overt atypia.  No granulomata or metastatic malignancy identified.  Confirmatory flow cytometric analysis ([id number]) failed to show any monoclonal B-cell population or significant T-cell abnormalities.  The overall findings are nonspecific but consistent with reactive lymphoid hyperplasia. Clinical correlation is recommended  B. TONSIL, RIGHT, TONSILLECTOMY: -Reactive lymphoid hyperplasia - See comment  C. TONSIL, LEFT, TONSILLECTOMY: -Reactive lymphoid hyperplasia - See comment  COMMENT:  Both tonsils show preserved architecture with florid lymphoid hyperplasia.  Confirmatory flow cytometric analysis ([id number]) failed to show any monoclonal B-cell population or significant T-cell abnormalities.   Assessment/Plan: Encounter Diagnoses  Name Primary?   Tonsillar hypertrophy Yes   Cervical lymphadenopathy     Assessment & Plan Cervical lymphadenopathy and tonsillar hypertrophy MRI and PET scan show cervical lymphadenopathy and tonsillar hypertrophy with increased uptake in the right tonsil. Differential includes chronic inflammation/auto-immune, lymphoma, and reactive lymph nodes, vs malignancy. No evidence of oropharyngeal cancer on flexible scope exam and lymph nodes appear to be ~ 1.5 cm and bilateral on CT neck, mildly FDG avid, and tonsils had FDG uptake on PET/CT R > L.  Visual examination and clinical presentation do not suggest head and neck cancer. Tonsillectomy and excisional biopsy considered for tissue diagnosis. Surgery delayed until bone marrow biopsy results are available to determine if there is an alternate diagnosis that can be made based on results. We discussed doing tonsillectomy upfront vs combining it with L level 2 lymph node  excision at the same time - Schedule tonsillectomy with possible cervical node dissection for diagnostic purposes. - Discuss with oncology regarding lymph node biopsy necessity. - Delay surgery until post-bone marrow biopsy results (she is scheduled for bone marrow bx 4/22) - Discuss surgical risks and benefits, including pain, bleeding, and healing complications.  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 Bone marrow biopsy scheduled to investigate. Antihistamines ineffective. Condition may impact surgical healing. - Proceed with scheduled bone marrow biopsy.  Update 12/06/2023 Assessment and Plan Assessment & Plan  Postoperative swelling/lymph node following R cervical lymph node excisional lymph node bx Postoperative swelling and pain in the throat region following tonsillectomy. Swelling appears to be a palpable lymph node above the neck incision, ~ 1.5 cm in size, likely an immune response to surgical trauma or  having surgery vs enlargement of the lymph node she previously had following surgery.  - Prescribe 6-day taper of steroids to reduce pain and secretions and help with recovery, start next morning with food. - no evidence of seroma or hematoma on exam  Lymphadenopathy Lymphadenopathy post-surgery likely reactive lymph node due to immune response. Differential includes scar tissue or new lymph node. No lymphoma evidence on excisional biopsy results. Tonsils were also (+) benign lymphoid hyperplasia, flow cytometry negative  - monitor for now  - RTC as scheduled for post-op check 12/22/23  Artice Last, MD Otolaryngology [facility] Phone: [phone] Fax: [phone]    12/06/2023, 7:46 AM

## 2023-12-07 NOTE — Telephone Encounter (Signed)
 See visit from 12/05/23

## 2023-12-19 NOTE — Addendum Note (Signed)
 Addended by: WALISIEWICZ, Chelise Hanger E on: 12/19/2023 03:06 PM   Modules accepted: Orders

## 2023-12-21 ENCOUNTER — Telehealth (INDEPENDENT_AMBULATORY_CARE_PROVIDER_SITE_OTHER): Payer: Self-pay | Admitting: Otolaryngology

## 2023-12-21 NOTE — Telephone Encounter (Signed)
 Called and left Vm of location and time

## 2023-12-22 ENCOUNTER — Encounter (INDEPENDENT_AMBULATORY_CARE_PROVIDER_SITE_OTHER): Payer: Self-pay | Admitting: Otolaryngology

## 2023-12-22 ENCOUNTER — Ambulatory Visit (INDEPENDENT_AMBULATORY_CARE_PROVIDER_SITE_OTHER): Admitting: Otolaryngology

## 2023-12-22 VITALS — BP 138/82 | HR 89

## 2023-12-22 DIAGNOSIS — J351 Hypertrophy of tonsils: Secondary | ICD-10-CM

## 2023-12-22 DIAGNOSIS — Z9889 Other specified postprocedural states: Secondary | ICD-10-CM

## 2023-12-22 DIAGNOSIS — R590 Localized enlarged lymph nodes: Secondary | ICD-10-CM

## 2023-12-22 NOTE — Progress Notes (Signed)
 ENT Progress Note:  Update 12/22/23 Chelsea Day is a 28 year old female who presents for f/u after tonsillectomy and right cervical node excision.   She experiences persistent right-sided lymph node swelling, noticeable during swallowing, which causes a sensation of a foreign body. The swelling is non-painful but has become harder over time.  She has additional non-painful swollen lymph nodes elsewhere on her body. Typically, her lymph nodes are painful and throbbing during illness, but this is not the case currently.  She has undergone a tonsillectomy, and the removed tissue was tested for lymphoma and other concerns, with negative results.  Her father had lymphoma, which required multiple attempts to identify the problematic lymph node. She has upcoming appt for additional workup with immunology in a few months.   Update last OV  Discussed the use of AI scribe software for clinical note transcription with the patient, who gave verbal consent to proceed.  History of Present Illness   Chelsea Day is a 28 year old female who presents with persistent swelling at the site of a previous surgery. She had tonsillectomy and right cervical lymph node excisional biopsy on 11/28/23. Final pathology for both tonsils and the lymph node was negative for lymphoma or malignancy.   She has persistent swelling at the site of right cervical lymph node excision, described as a ' hard ball' that has not decreased in size. There is a sensation of tightness in the area, but no significant pain or shortness of breath.  She experiences pain with swallowing since the tonsillectomy, although she is able to drink fluids and manage her secretions. No fever has been experienced, and she has not been on antibiotics or steroids previously.  Her lymph nodes have been a recurring issue, with previous bone marrow biopsy negative for mast cell activation sy. A family member has similar issues with lymph nodes that  swell and resolve within a week.   Records Reviewed:  Initial Evaluation  Reason for Consult: neck pain and cervical lymphadenopathy   HPI: Discussed the use of AI scribe software for clinical note transcription with the patient, who gave verbal consent to proceed.  History of Present Illness Chelsea Day is a 28 year old female who presents with swollen lymph nodes and tonsils. She was referred by a screening clinic for concerning findings on imaging and exam.  She presents with swollen lymph nodes and tonsils, initially identified through imaging and exam at a screening clinic. An MRI revealed swollen lymph nodes and tonsils, and a PET scan had uptake in the R and L tonsil/mild uptake of the level 2 nodes. Blood work showed WBC 16, prompting further investigation.  She experienced pain in her chest and the right side of her neck, radiating down her arm. The chest discomfort is located in the right upper chest, and the neck pain is on the right side. She describes feeling generally unwell, with pain particularly in her neck, and had spine MRI, with mildly enlarged b/l cervical nodes.   She has noticed a sensation of something in the back of her throat, which has persisted since the end of last year. She describes difficulty swallowing, likening it to the sensation of a pill being stuck. No sore throat and she has not had her tonsils removed. She has a rash and reports generalized fatigue.   Over the past couple of years, she has experienced intermittent skin flushing and, more recently, increased fatigue and night sweats. These symptoms have contributed to her feeling generally  unwell.  She has tried antihistamines for her rash without relief. She has been seeing a cancer center for her workup related to mast cell activation sy and carcinoid is also considered. A bone marrow biopsy is scheduled in two weeks to investigate potential mast cell activation sy.    Records Reviewed:   Heme/Onc Office visit 11/03/23: Chelsea Day 28 y.o. female with medical history significant for preeclampsia, hypertension, anxiety, right salpingo oophorectomy.   On review of the previous records patient is being referred by PCP Darnelle Elders, PA-C. Patient presented to the emergency room on 10/19/2023 for acute onset of right sided neck pain with numbness and tingling down her right arm down to her 3rd, 4th and 5th digit. DVT study was negative. EDP's note includes concern for cervical radiculopathy and recommend outpatient MRI.  Patient was started on gabapentin  and prednisone .  MRI of cervical spine performed on 10/22/2023 revealed mildly enlarged bilateral cervical lymph nodes and mild asymmetric enlargement of the right palatine tonsil. CT soft tissue neck was performed on 10/31/2023 and the result shows symmetric rate enlarged and nonspecific bilateral level 2A nodes, 15 mm short axis and 2 to 3 cm long axis. Radiologist also commented on mild postinflammatory calcifications of the left palatine tonsil. CT chest was performed 11/01/23 for complaint of chest pressure and was negative for acute infectious process and no lymphadenopathy was seen. Per PCP note patient also had an x-ray done by her chiropractor and orthopedist prior to the original  ED visit.  Labs from PCP visit on 10/28/2023 showed elevated WBC 16.3, elevated neutrophil # 9.3 and lymphocytes  #5.7. Patient had completed prednisone  course x 2 days prior to lab work. Patient was also started on Xyzal  at recent PCP visit. Further chart review shows patient was worked up in 2022 for concern of mast cell syndrome by Dr. Rosaline Coma.  Labs were overall unremarkable including tryptase and IgE.  It was recommended that patient proceed with bone marrow biopsy. Patient did not proceed with bone marrow biopsy because of the cost. Dr. Les Rao office note states: She also was seen by allergist who found that she was negative for allergies to metals,  foods, and other substances. GI evaluated her with concern for carcinoid syndrome was unable to find a clear etiology for her symptoms.    On exam today patient presents unaccompanied. She reports systemic symptoms including night sweats almost every night for the past two to three weeks and flushing episodes nearly ten times a day, affecting her chest, neck, face, arms, and stomach. The flushing is described as 'prickly' with no specific triggers. The flushing started after the birth of her second child and was "under control" and only happening once or twice per day for the last couple of years she states.She also experiences significant fatigue, feeling unwell, and a lack of appetite, leading to unintentional weight loss of approximately 20 pounds since January 2025. Patient adds that she has been consecutively ill since the start of the year with COVID, Norovirus, and the Flu. Most recently had the flu in the beginning of February. Her PCP started her on Xzyal x 6 days ago. Patient also reports pain in her right clavicle that she describes as bone pain that radiates down her arm. She recent saw neurosurgery for this and is scheduled for an MRI of her elbow on 04/07/2.   She reports that her medical history is significant for her father having lymphoma, maternal aunt with  leukemia, maternal grandmother and great-grandmother with  breast cancer and a paternal aunt with colon cancer. Patient admits to remote smoking history for 5 years, from age [age] with 2 packs/week. She currently vapes. She does drink alcohol socially. 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   #Cervical lymphadenopathy #recurrent flushing, bodyaches and fatigue - Reviewed CT neck and chest as well as MRI of cervical spine results with patient. - Labs collected today include CBC, CMP, ESR, CRP, LDH, flow cytometry, Hep B & C serologies, HIV serology. Will also check tryptase level, chromogranin A, metanephrines,  and 24 Hour urine 5 HIAA.  - PET scan ordered and scheduled to check FDG activity.  - ENT consult ordered as well for consideration of LN biopsy   #Age related screenings - UTD with pap smear. Otherwise N/A based on age   Past Medical History:  Diagnosis Date   Bilateral ovarian cysts    Endometriosis determined by laparoscopy    History of postpartum uterine bleeding    Irregular periods/menstrual cycles    Lymphadenopathy    Pregnancy induced hypertension     Past Surgical History:  Procedure Laterality Date   IR BONE MARROW BIOPSY & ASPIRATION  11/22/2023   LAPAROSCOPIC OVARIAN CYSTECTOMY Right 01/24/2015   Procedure: LAPAROSCOPIC LYSIS OF ADHESION, LAPROSCOPIC RIGHT SALPINGOOOPHORECTOMY, BIOPSY OF RIGHT BOARD LIGAMENT; LYSIS BIOPSY OF ADHESIONS, BIOPSY OF LEFT UTERO LIGAMENT;  Surgeon: Lula Sale, MD;  Location: [facility];  Service: Gynecology;  Laterality: Right;   LAPAROSCOPY  11/08/2011   Procedure: LAPAROSCOPY OPERATIVE;  Surgeon: Hamp Levine, MD;  Location: [facility];  Service: Gynecology;  Laterality: Right;   LYMPH NODE BIOPSY Right 11/28/2023   Procedure: LYMPH NODE BIOPSY;  Surgeon: Artice Last, MD;  Location: [facility];  Service: ENT;  Laterality: Right;   OVARIAN CYST REMOVAL  11/08/2011   Procedure: OVARIAN CYSTECTOMY;  Surgeon: Hamp Levine, MD;  Location: [facility];  Service: Gynecology;  Laterality: Right;   TONSILLECTOMY Bilateral 11/28/2023   Procedure: TONSILLECTOMY;  Surgeon: Artice Last, MD;  Location: [facility];  Service: ENT;  Laterality: Bilateral;   UNILATERAL SALPINGECTOMY Right 01/24/15    Family History  Problem Relation Age of Onset   Cancer Father        ???   Multiple sclerosis Mother    Colon cancer Maternal Grandmother    Colon cancer Maternal Aunt    Allergic rhinitis Neg Hx    Angioedema Neg Hx    Asthma Neg Hx    Eczema Neg Hx    Immunodeficiency Neg Hx    Urticaria Neg Hx     Social History:   reports that she quit smoking about 10 years ago. Her smoking use included cigarettes. She has never used smokeless tobacco. She reports that she does not drink alcohol and does not use drugs.  Allergies:  Allergies  Allergen Reactions   Avocado Anaphylaxis   Banana Anaphylaxis   Gadolinium Derivatives Anaphylaxis, Hives and Palpitations    Other Reaction(s): Flushing   Iodinated Contrast Media Anaphylaxis    Patient reported chest pressure and difficulty breathing after receiving contrast for PET scan  Other Reaction(s): chest pressure, trouble breathing, facial flushing   Kiwi Extract Anaphylaxis   Latex Rash    Medications: I have reviewed the patient's current medications.  The PMH, PSH, Medications, Allergies, and SH were reviewed and updated.  ROS: Constitutional: Negative for fever, weight loss and weight gain. Cardiovascular: Negative for chest pain and dyspnea on exertion. Respiratory:  Is not experiencing shortness of breath at rest. Gastrointestinal: Negative for nausea and vomiting. Neurological: Negative for headaches. Psychiatric: The patient is not nervous/anxious  Blood pressure 138/82, pulse 89, SpO2 98%. There is no height or weight on file to calculate BMI.  PHYSICAL EXAM:  Exam: General: Well-developed, well-nourished Communication and Voice: Clear pitch and clarity Respiratory Respiratory effort: Equal inspiration and expiration without stridor Cardiovascular Peripheral Vascular: Warm extremities with equal color/perfusion Eyes: No nystagmus with equal extraocular motion bilaterally Neuro/Psych/Balance: Patient oriented to person, place, and time; Appropriate mood and affect; Gait is intact with no imbalance; Cranial nerves I-XII are intact Head and Face Inspection: Normocephalic and atraumatic without mass or lesion Palpation: Facial skeleton intact without bony stepoffs Salivary Glands: No mass or tenderness Facial Strength: Facial motility  symmetric and full bilaterally ENT Pinna: External ear intact and fully developed External canal: Canal is patent with intact skin Tympanic Membrane: Clear and mobile External Nose: No scar or anatomic deformity Internal Nose: Septum is deviated to the left. No polyp, or purulence. Mucosal edema and erythema present.  Bilateral inferior turbinate hypertrophy.  Lips, Teeth, and gums: Mucosa and teeth intact and viable TMJ: No pain to palpation with full mobility Oral cavity/oropharynx: No erythema or exudate, s/p tonsillectomy healing as expected no blood or clot   Neck Neck and Trachea: Midline trachea without mass or lesion Thyroid : No mass or nodularity Lymphatics: excisional bx site R neck incision is c/d/I, no erythema or ecchymosis no fluctuance, above the incision there is a palpable lymph node ~ 1.0 cm (slightly softer today relative to last exam  Studies Reviewed: CT neck 10/31/23 FINDINGS: Pharynx and larynx: Larynx and pharynx soft tissue contours are within normal limits. There are mild postinflammatory calcifications of the left palatine tonsil. Parapharyngeal and retropharyngeal spaces are negative.   Salivary glands: Negative sublingual space. Submandibular and parotid glands are within normal limits.   Thyroid : Negative.   Lymph nodes: Lymph   Enlarged right level 2A lymph node, 15 mm short axis, approximately 23 mm long axis on series 2, image 48 and sagittal image 45.   Slightly smaller contralateral left level 2A lymph node on series 2, image 47 and sagittal image 87, 12-15 mm short axis.   But other bilateral level 2 lymph nodes are normal, 6-7 mm short axis. No cystic or necrotic lymph node. Bilateral level 1, 3 through 5 nodes are within normal limits.   Vascular: Major vascular structures in the bilateral neck and at the skull base are enhancing and appear to be patent. Left IJ appears dominant. Right vertebral artery appears dominant.   Limited  intracranial: Negative.   Visualized orbits: Negative.   Mastoids and visualized paranasal sinuses: Clear bilaterally.   Skeleton: No osseous abnormality identified.   Upper chest:  Negative.  See also chest CT performed following day.   IMPRESSION: Symmetrically enlarged and nonspecific bilateral level 2A nodes, 15 mm short axis and 2-3 cm long axis. Otherwise negative CT appearance of the bilateral neck. Recommend follow-up by clinical exam, with repeat Neck CT (IV contrast preferred) if any area further enlarges or becomes painful.    PET/CT 11/07/23 IMPRESSION: 1. No convincing evidence of lymphoma. 2. Asymmetric hypermetabolic activity in the RIGHT greater than LEFT palatine tonsil. Recommend direct visualization potential sampling. 3. Relatively mild metabolic activity associated with enlarged level 2 cervical lymph nodes. 4. No evidence adenopathy in the chest abdomen pelvis. 5. Normal spleen and bone marrow.  CT chest 11/01/23 Narrative & Impression  CLINICAL DATA:  Chest pain and pressure, swollen lymph nodes   EXAM: CT CHEST WITHOUT CONTRAST   TECHNIQUE: Multidetector CT imaging of the chest was performed following the standard protocol without IV contrast.   RADIATION DOSE REDUCTION: This exam was performed according to the departmental dose-optimization program which includes automated exposure control, adjustment of the mA and/or kV according to patient size and/or use of iterative reconstruction technique.   COMPARISON:  CT abdomen 10/25/2020   FINDINGS: Cardiovascular: Heart size normal. No pericardial effusion. Central great vessels normal in caliber.   Mediastinum/Nodes: No mediastinal hematoma, mass, or adenopathy.   Lungs/Pleura: No pleural effusion. No pneumothorax. Lungs are clear.   Upper Abdomen: No acute abnormality.   Musculoskeletal: Minimal spurring in the lower thoracic spine. No acute findings.   IMPRESSION: Negative   Labs 7 days  ago DIAGNOSIS:   - No abnormal B or T-cell population identified   GATING AND PHENOTYPIC ANALYSIS:   Gated population: Flow cytometric immunophenotyping is performed using  antibodies to the antigens listed in the table below. Electronic gates  are placed around a cell cluster displaying light scatter properties  corresponding to: lymphocytes      Latest Ref Rng & Units 11/22/2023    8:49 AM 11/03/2023   11:21 AM 07/06/2023    3:01 PM  CBC  WBC 4.0 - 10.5 K/uL 9.3  13.7  6.5   Hemoglobin 12.0 - 15.0 g/dL 16.1  09.6  04.5   Hematocrit 36.0 - 46.0 % 40.6  42.0  41.8   Platelets 150 - 400 K/uL 362  324  306    Surg path (tonsils and right cervical lymph node) FINAL MICROSCOPIC DIAGNOSIS:  A. LYMPH NODE, RIGHT LEVEL 2 CERVICAL, EXCISION: -Reactive lymphoid hyperplasia - See comment  COMMENT:  The sections show preservation of the lymph nodal architecture with open sinuses.  The lymphoid tissue displays numerous variably sized lymphoid follicles with reactive appearing germinal centers.  The paracortex is primarily composed of small lymphoid cells without overt atypia.  No granulomata or metastatic malignancy identified.  Confirmatory flow cytometric analysis ([id number]) failed to show any monoclonal B-cell population or significant T-cell abnormalities.  The overall findings are nonspecific but consistent with reactive lymphoid hyperplasia. Clinical correlation is recommended  B. TONSIL, RIGHT, TONSILLECTOMY: -Reactive lymphoid hyperplasia - See comment  C. TONSIL, LEFT, TONSILLECTOMY: -Reactive lymphoid hyperplasia - See comment  COMMENT:  Both tonsils show preserved architecture with florid lymphoid hyperplasia.  Confirmatory flow cytometric analysis ([id number]) failed to show any monoclonal B-cell population or significant T-cell abnormalities.   Assessment/Plan: Encounter Diagnoses  Name Primary?   Tonsillar hypertrophy Yes   Cervical lymphadenopathy       Assessment & Plan Cervical lymphadenopathy and tonsillar hypertrophy MRI and PET scan show cervical lymphadenopathy and tonsillar hypertrophy with increased uptake in the right tonsil. Differential includes chronic inflammation/auto-immune, lymphoma, and reactive lymph nodes, vs malignancy. No evidence of oropharyngeal cancer on flexible scope exam and lymph nodes appear to be ~ 1.5 cm and bilateral on CT neck, mildly FDG avid, and tonsils had FDG uptake on PET/CT R > L.  Visual examination and clinical presentation do not suggest head and neck cancer. Tonsillectomy and excisional biopsy considered for tissue diagnosis. Surgery delayed until bone marrow biopsy results are available to determine if there is an alternate diagnosis that can be made based on results. We discussed doing tonsillectomy upfront vs combining it with L level 2 lymph node excision at the same time - Schedule tonsillectomy  with possible cervical node dissection for diagnostic purposes. - Discuss with oncology regarding lymph node biopsy necessity. - Delay surgery until post-bone marrow biopsy results (she is scheduled for bone marrow bx 4/22) - Discuss surgical risks and benefits, including pain, bleeding, and healing complications.  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 Bone marrow biopsy scheduled to investigate. Antihistamines ineffective. Condition may impact surgical healing. - Proceed with scheduled bone marrow biopsy.   Enlarged lymph node Persistent enlarged lymph node near the larynx, less firm than before, suggesting improvement. Previous tests negative for malignancy, likely chronic inflammation or autoimmune. Awaiting immunology evaluation. - Follow up with immunology. - Monitor lymph node for changes. - Consider intervention if immunology indicates concerns.  Tonsillectomy Post-tonsillectomy healing ongoing, not fully restored. - Monitor  healing progress. - Reassess if symptoms persist or worsen.  Update 12/22/23 Assessment and Plan Assessment & Plan  Postoperative swelling/lymph node following R cervical lymph node excisional lymph node bx, appears to be slightly smaller today - likely reactive following excisional node biopsy and tonsillectomy  - will monitor for now   Cervical Lymphadenopathy  No lymphoma evidence on excisional biopsy results. Tonsils were also (+) benign lymphoid hyperplasia, flow cytometry negative  - proceed with immunology consultation     Artice Last, MD Otolaryngology [facility] Phone: [phone] Fax: [phone]    12/22/2023, 11:18 AM

## 2024-01-26 ENCOUNTER — Ambulatory Visit: Admitting: Allergy

## 2024-01-26 ENCOUNTER — Other Ambulatory Visit: Payer: Self-pay | Admitting: Allergy

## 2024-01-26 ENCOUNTER — Encounter: Payer: Self-pay | Admitting: Allergy

## 2024-01-26 ENCOUNTER — Other Ambulatory Visit: Payer: Self-pay

## 2024-01-26 VITALS — BP 118/66 | HR 80 | Temp 98.7°F | Ht 71.0 in | Wt 275.5 lb

## 2024-01-26 DIAGNOSIS — K9049 Malabsorption due to intolerance, not elsewhere classified: Secondary | ICD-10-CM

## 2024-01-26 DIAGNOSIS — R5383 Other fatigue: Secondary | ICD-10-CM | POA: Diagnosis not present

## 2024-01-26 DIAGNOSIS — R197 Diarrhea, unspecified: Secondary | ICD-10-CM | POA: Diagnosis not present

## 2024-01-26 DIAGNOSIS — Z91040 Latex allergy status: Secondary | ICD-10-CM

## 2024-01-26 DIAGNOSIS — R232 Flushing: Secondary | ICD-10-CM | POA: Diagnosis not present

## 2024-01-26 MED ORDER — MONTELUKAST SODIUM 10 MG PO TABS: 10.0000 mg | ORAL_TABLET | Freq: Every day | ORAL | 5 refills | Status: DC

## 2024-01-26 MED ORDER — EPINEPHRINE 0.3 MG/0.3ML IJ SOAJ: 0.3000 mg | INTRAMUSCULAR | 1 refills | Status: AC | PRN

## 2024-01-26 MED ORDER — CROMOLYN SODIUM 100 MG/5ML PO CONC: 100.0000 mg | Freq: Three times a day (TID) | ORAL | 5 refills | Status: DC

## 2024-01-26 MED ORDER — LEVOCETIRIZINE DIHYDROCHLORIDE 5 MG PO TABS: 5.0000 mg | ORAL_TABLET | Freq: Four times a day (QID) | ORAL | 5 refills | Status: DC | PRN

## 2024-01-26 MED ORDER — FAMOTIDINE 20 MG PO TABS: 20.0000 mg | ORAL_TABLET | Freq: Two times a day (BID) | ORAL | 5 refills | Status: DC | PRN

## 2024-01-26 NOTE — Patient Instructions (Addendum)
 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  For symptom control recommend the following: - Take Xyzal  2 tab twice a day.  (Max daily dosing is 4 tabs a day)  - Take Pepcid  twice daily. - Take Singulair 10mg  daily.  This is an antileukotriene that can help with both allergy and asthma symptom control.  If you notice any change in mood/behavior/sleep after starting Singulair then stop this medication and let us  know.  Symptoms resolve after stopping the medication.  - Take Cromolyn 100 mg prior to meals 3 times a day for gastrointestinal symptoms. - Provided EpiPen  and emergency action plan pathologies of allergic reaction. - Consider referral to mast cell specialist (this would involve traveling likely outside of the state) if symptoms persist.  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 - Avoid latex exposure.  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 - Avoid foods that trigger symptoms- banana, avocado, kiwi, fried foods with oil - Will place referral to dietitian for dietary management.  Follow-up in 3 months or sooner if needed

## 2024-01-26 NOTE — Progress Notes (Addendum)
 New Patient Note  RE: Chelsea Day MRN: [id number] DOB: 24-Oct-1995 Date of Office Visit: 01/26/2024  Primary care provider: Katina Pfeiffer, PA-C  Chief Complaint: flushing,  food allergy  History of present illness: Chelsea Day is a 28 y.o. female presenting today for evaluation of flushing, food allergy. She is a former pt of the practice last seeing Dr Green on 02/13/20 for allergic reaction, diarrhea, urticaria.  At that visit she had negative/normal testing for food allergens, chronic hive index, thyroid  studies, tryptase, ANA, inflammatory markers.   Discussed the use of AI scribe software for clinical note transcription with the patient, who gave verbal consent to proceed.  She states she is still having similar symptoms that she had when she initially saw Dr. Green.  Symptoms seem to be worsening and becoming more frequent. She experiences chest pain radiating to her elbow, along with swollen lymph nodes in her arm, chest, and neck. She describes severe fatigue and episodes of flushing in her chest, neck, and face, which have worsened over the past few years. She has intermittent diarrhea after eating as well.  Initially occurring once or twice, these episodes now happen 10 to 15 times a day, accompanied by a rapid heart rate and brain fog. The flushing is not itchy.  MRI and CT scans confirmed the presence of swollen lymph nodes. She underwent a tonsillectomy and lymph node biopsy, which were negative for lymphoma.  She has been seeing hematology oncology provider for the lymphadenopathy.  She has had an extensive workup for this (see lab results below).  She also underwent a bone marrow biopsy that was negative for mast cell proliferation.  The tonsillar pathology showed no abnormal findings as well.   When she saw Dr. Green previously he treated her with  a histamine regimen including Xyzal  and Pepcid , which did not alleviate her symptoms.  Certain foods, such as fried  foods cooked in oils, alcohol, banana, avocado, kiwi, chocolate, soy, and whey, exacerbate her symptoms, particularly the flushing and gastrointestinal issues. She notes a possible cross-reactivity with latex, as she experiences itching with latex exposure.  She has had testing done in the past that she states has been negative for these foods.  She has not been on Singulair or cromolyn before, and she has not used an EpiPen . Her symptoms have been persistent and worsening, impacting her daily life and ability to function.  She avoids eating at restaurants due to severe reactions, including vomiting and diarrhea.  No issues with blood pressure to her knowledge however she is not monitoring at home but notes fluctuations in heart rate and occasional wheezing during flushing episodes.     Upon her research she has noted carcinoid as a potential diagnosis that has not been tested for at this time.  Upon looking at her lab orders a urinary test for carcinoid was ordered back in April however it was never collected.  She states at the time she was quite overwhelmed with everything going on with her health and forgot to go pick up the collection jug.  Review of systems: 10pt ROS negative unless noted above in [facility]  Past medical history: Past Medical History:  Diagnosis Date   Bilateral ovarian cysts    Endometriosis determined by laparoscopy    History of postpartum uterine bleeding    Irregular periods/menstrual cycles    Lymphadenopathy    Pregnancy induced hypertension     Past surgical history: Past Surgical History:  Procedure Laterality Date  IR BONE MARROW BIOPSY & ASPIRATION  11/22/2023   LAPAROSCOPIC OVARIAN CYSTECTOMY Right 01/24/2015   Procedure: LAPAROSCOPIC LYSIS OF ADHESION, LAPROSCOPIC RIGHT SALPINGOOOPHORECTOMY, BIOPSY OF RIGHT BOARD LIGAMENT; LYSIS BIOPSY OF ADHESIONS, BIOPSY OF LEFT UTERO LIGAMENT;  Surgeon: Rome Rigg, MD;  Location: [facility];  Service: Gynecology;   Laterality: Right;   LAPAROSCOPY  11/08/2011   Procedure: LAPAROSCOPY OPERATIVE;  Surgeon: Oneil FORBES Piety, MD;  Location: [facility];  Service: Gynecology;  Laterality: Right;   LYMPH NODE BIOPSY Right 11/28/2023   Procedure: LYMPH NODE BIOPSY;  Surgeon: Okey Burns, MD;  Location: [facility];  Service: ENT;  Laterality: Right;   OVARIAN CYST REMOVAL  11/08/2011   Procedure: OVARIAN CYSTECTOMY;  Surgeon: Oneil FORBES Piety, MD;  Location: [facility];  Service: Gynecology;  Laterality: Right;   TONSILLECTOMY Bilateral 11/28/2023   Procedure: TONSILLECTOMY;  Surgeon: Okey Burns, MD;  Location: [facility];  Service: ENT;  Laterality: Bilateral;   TONSILLECTOMY     UNILATERAL SALPINGECTOMY Right 01/24/2015    Family history:  Family History  Problem Relation Age of Onset   Cancer Father        ???   Multiple sclerosis Mother    Colon cancer Maternal Grandmother    Colon cancer Maternal Aunt    Allergic rhinitis Neg Hx    Angioedema Neg Hx    Asthma Neg Hx    Eczema Neg Hx    Immunodeficiency Neg Hx    Urticaria Neg Hx     Social history: Lives in a home with carpeting in the bedroom with electric heating and central cooling.  Dog in the home.  No concern for water damage, mildew or roaches in the home.  She does vape.  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   Tobacco Use   Smoking status: Former    Current packs/day: 0.00    Types: Cigarettes    Quit date: 11/30/2013    Years since quitting: 10.1   Smokeless tobacco: Never  Vaping Use   Vaping status: Every Day   Substances: Nicotine, Flavoring    Medication List: Current Outpatient Medications  Medication Sig Dispense Refill   cromolyn (GASTROCROM) 100 MG/5ML solution Take 5 mLs (100 mg total) by mouth 3 (three) times daily before meals. 5 mL 5   EPINEPHrine  0.3 mg/0.3 mL IJ SOAJ injection Inject 0.3 mg into the muscle as needed for anaphylaxis. 0.3 mL 1    etonogestrel (NEXPLANON) 68 MG IMPL implant 1 each by Subdermal route once.     famotidine  (PEPCID ) 20 MG tablet Take 1 tablet (20 mg total) by mouth 2 (two) times daily as needed for heartburn or indigestion. 60 tablet 5   levocetirizine (XYZAL ) 5 MG tablet Take 1 tablet (5 mg total) by mouth 4 (four) times daily as needed for allergies. 120 tablet 5   montelukast (SINGULAIR) 10 MG tablet Take 1 tablet (10 mg total) by mouth at bedtime. 30 tablet 5   acetaminophen  (TYLENOL ) 500 MG tablet Take 1 tablet (500 mg total) by mouth every 6 (six) hours. Take every 6 hrs x 2-3 days then take every 6 hrs as needed (Patient not taking: Reported on 01/26/2024) 30 tablet 0   ibuprofen  (ADVIL ) 600 MG tablet Take 1 tablet (600 mg total) by mouth every 6 (six) hours. Please take every 6 hrs, and stagger this medication 3 hrs apart from Tylenol  (Patient not taking: Reported on 01/26/2024) 30 tablet  0   No current facility-administered medications for this visit.    Known medication allergies: Allergies  Allergen Reactions   Avocado Anaphylaxis   Banana Anaphylaxis   Gadolinium Derivatives Anaphylaxis, Hives and Palpitations    Other Reaction(s): Flushing   Iodinated Contrast Media Anaphylaxis    Patient reported chest pressure and difficulty breathing after receiving contrast for PET scan  Other Reaction(s): chest pressure, trouble breathing, facial flushing   Kiwi Extract Anaphylaxis   Latex Rash     Physical examination: Blood pressure 118/66, pulse 80, temperature 98.7 F (37.1 C), temperature source Temporal, height 5' 11 (1.803 m), weight 275 lb 8 oz (125 kg), SpO2 97%.  General: Alert, interactive, in no acute distress. HEENT: PERRLA, TMs pearly gray, turbinates non-edematous without discharge, post-pharynx non erythematous. Neck: Supple without lymphadenopathy. Lungs: Clear to auscultation without wheezing, rhonchi or rales. {no increased work of breathing. CV: Normal S1, S2 without  murmurs. Abdomen: Nondistended, nontender. Skin: Erythematous. Extremities:  No clubbing, cyanosis or edema. Neuro:   Grossly intact.  Diagnostics/Labs: Labs:  Component     Latest Ref Rng 11/03/2023 11/22/2023  WBC     4.0 - 10.5 K/uL 13.7 (H)  9.3   RBC     3.87 - 5.11 MIL/uL 4.88  4.64   Hemoglobin     12.0 - 15.0 g/dL 85.9  86.7   HCT     63.9 - 46.0 % 42.0  40.6   MCV     80.0 - 100.0 fL 86.1  87.5   MCH     26.0 - 34.0 pg 28.7  28.4   MCHC     30.0 - 36.0 g/dL 66.6  67.4   RDW     88.4 - 15.5 % 13.8  13.3   Platelets     150 - 400 K/uL 324  362   nRBC     0.0 - 0.2 % 0.0  0.0   Neutrophils     % 62  51   NEUT#     1.7 - 7.7 K/uL 8.7 (H)  4.7   Lymphocytes     % 30  38   Lymphs Abs     0.7 - 4.0 K/uL 4.1 (H)  3.6   Monocytes Relative     % 6  6   Monocyte #     0.1 - 1.0 K/uL 0.8  0.6   Eosinophil     % 1  4   Eosinophils Absolute     0.0 - 0.5 K/uL 0.1  0.4   Basophil     % 1  1   Basophils Absolute     0.0 - 0.1 K/uL 0.1  0.1   Immature Granulocytes     % 0  0   Abs Immature Granulocytes     0.00 - 0.07 K/uL 0.04  0.02   Sodium     135 - 145 mmol/L 138    Potassium     3.5 - 5.1 mmol/L 3.9    Chloride     98 - 111 mmol/L 107    CO2     22 - 32 mmol/L 25    Glucose     70 - 99 mg/dL 898 (H)    BUN     6 - 20 mg/dL 10    Creatinine     9.55 - 1.00 mg/dL 9.21    Calcium      8.9 - 10.3 mg/dL 9.5    Total Protein  6.5 - 8.1 g/dL 7.6    Albumin     3.5 - 5.0 g/dL 4.5    AST     15 - 41 U/L 13 (L)    ALT     0 - 44 U/L 21    Alkaline Phosphatase     38 - 126 U/L 65    Total Bilirubin     0.0 - 1.2 mg/dL 0.6    GFR, Est Non African American     >60 mL/min >60    Anion gap     5 - 15  6    Normetanephrine, Pl     0.0 - 210.1 pg/mL 69.7    Metanephrine, Pl     0.0 - 88.0 pg/mL <25.0    CRP     <1.0 mg/dL 0.7    Flow Cytometry SEE SEPARATE REPORT (C)   HEP B CORE AB     Negative  Negative    Hep B S Ab     NON REACTIVE  NON  REACTIVE    Hepatitis B Surface Ag     NON REACTIVE  NON REACTIVE    HCV Ab     NON REACTIVE  NON REACTIVE    LDH     98 - 192 U/L 166    Sed Rate     0 - 22 mm/hr 7    HIV Screen 4th Generation wRfx     Non Reactive  Non Reactive    Chromogranin A (ng/mL)     0.0 - 101.8 ng/mL 37.2    Tryptase     2.2 - 13.2 ug/L 3.2      Surgical pathology 11/28/23-  Lymph node, cervical, excision-reactive lymphoid hyperplasia Tonsil right, tonsillectomy-reactive lymphoid hyperplasia  Flow cytometry Path report 11/28/2023-no monoclonal B-cell population no significant T-cell abnormalities identified  Bone marrow biopsy 11/22/2023-essentially normocellular bone marrow with otherwise orderly trilineage hematopoiesis  Assessment and plan: Flushing, diarrhea, fatigue 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  For symptom control recommend the following: - Take Xyzal  2 tab twice a day.  (Max daily dosing is 4 tabs a day)  - Take Pepcid  twice daily. - Take Singulair 10mg  daily.  This is an antileukotriene that can help with both allergy and asthma symptom control.  If you notice any change in mood/behavior/sleep after starting Singulair then stop this medication and let us  know.  Symptoms resolve after stopping the medication.  - Take Cromolyn 100 mg prior to meals 3 times a day for gastrointestinal symptoms. - Provided EpiPen  and emergency action plan pathologies of allergic reaction. - Consider referral to mast cell specialist (this would involve traveling likely outside of the state) if symptoms persist.  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 - Avoid latex exposure.  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 - Avoid foods that trigger symptoms- banana, avocado, kiwi, fried  foods with oil - Will place referral to dietitian for dietary management.  Follow-up in 3 months or sooner if needed  I appreciate the opportunity to take part in Miguel's care. Please do not hesitate to contact me with questions.  Sincerely,   Danita Brain, MD Allergy/Immunology [facility]

## 2024-01-27 ENCOUNTER — Telehealth: Payer: Self-pay | Admitting: Allergy

## 2024-01-27 NOTE — Telephone Encounter (Signed)
 Blanca has been referred to [place].  Per the providers' request, I emailed the referral and all corresponding notes to her.  She will reach out to the patient and email me with updates.

## 2024-01-30 MED ORDER — CROMOLYN SODIUM 100 MG/5ML PO CONC
100.0000 mg | Freq: Three times a day (TID) | ORAL | 5 refills | Status: DC
Start: 2024-01-30 — End: 2024-06-12

## 2024-01-30 NOTE — Telephone Encounter (Signed)
 Refill for cromolyn  solution x 1 (450 mL- 30 day supply) with 5 refills sent to [facility].

## 2024-01-30 NOTE — Addendum Note (Signed)
 Addended by: Rayley Gao J on: 01/30/2024 10:00 AM   Modules accepted: Orders

## 2024-02-02 ENCOUNTER — Other Ambulatory Visit: Payer: Self-pay | Admitting: *Deleted

## 2024-02-02 ENCOUNTER — Inpatient Hospital Stay: Attending: Physician Assistant

## 2024-02-02 DIAGNOSIS — R591 Generalized enlarged lymph nodes: Secondary | ICD-10-CM

## 2024-02-08 LAB — 5 HIAA, QUANTITATIVE, URINE, 24 HOUR
5-HIAA, Ur: 3.1 mg/L
5-HIAA,Quant.,24 Hr Urine: 5 mg/(24.h) (ref 0.0–14.9)
Total Volume: 1600

## 2024-02-20 NOTE — Telephone Encounter (Signed)
 I called the patient to get authorization for the change, no answer left a vm to give the office a call back in regards to her medication.

## 2024-02-24 ENCOUNTER — Other Ambulatory Visit: Payer: Self-pay | Admitting: Allergy

## 2024-02-24 MED ORDER — FAMOTIDINE 20 MG PO TABS: 20.0000 mg | ORAL_TABLET | Freq: Two times a day (BID) | ORAL | 5 refills | Status: DC

## 2024-02-24 MED ORDER — FAMOTIDINE 40 MG/5ML PO SUSR: 20.0000 mg | Freq: Two times a day (BID) | ORAL | 0 refills | Status: DC

## 2024-02-24 NOTE — Addendum Note (Signed)
 Addended by: MARCINE ISAIAH CROME on: 02/24/2024 11:35 AM   Modules accepted: Orders

## 2024-03-30 ENCOUNTER — Ambulatory Visit: Admitting: Allergy

## 2024-03-30 DIAGNOSIS — J309 Allergic rhinitis, unspecified: Secondary | ICD-10-CM

## 2024-05-06 NOTE — Progress Notes (Deleted)
 Office Visit Note  Patient: Chelsea Day             Date of Birth: [birth year]           MRN: [id number]             PCP: Katina Pfeiffer, PA-C Referring: Walisiewicz, Kaitlyn E,* Visit Date: 05/07/2024 Occupation: Data Unavailable  Subjective:  No chief complaint on file.   History of Present Illness: Chelsea Day is a 28 y.o. female ***     Activities of Daily Living:  Patient reports morning stiffness for *** {minute/hour:19697}.   Patient {ACTIONS;DENIES/REPORTS:21021675::Denies} nocturnal pain.  Difficulty dressing/grooming: {ACTIONS;DENIES/REPORTS:21021675::Denies} Difficulty climbing stairs: {ACTIONS;DENIES/REPORTS:21021675::Denies} Difficulty getting out of chair: {ACTIONS;DENIES/REPORTS:21021675::Denies} Difficulty using hands for taps, buttons, cutlery, and/or writing: {ACTIONS;DENIES/REPORTS:21021675::Denies}  No Rheumatology ROS completed.   PMFS History:  Patient Active Problem List   Diagnosis Date Noted   Chronic tonsillitis 11/28/2023   Tonsillar hypertrophy 11/28/2023   Cervical lymphadenopathy 11/28/2023   Abdominal pain 05/21/2020   Rash 05/21/2020   Allergic reaction 02/13/2020   Diarrhea 02/13/2020   [redacted] weeks gestation of pregnancy    RUQ pain    Preeclampsia, severe, third trimester 08/27/2019   Postpartum hemorrhage, third stage, postpartum condition 08/13/2016   Vaginal delivery 08/13/2016   Hypertension in pregnancy, preeclampsia, severe, third trimester 08/11/2016   Group beta Strep positive 08/11/2016   History of right salpingo-oophorectomy 08/11/2016   Need for vaccination for rubella 08/11/2016    Past Medical History:  Diagnosis Date   Bilateral ovarian cysts    Endometriosis determined by laparoscopy    History of postpartum uterine bleeding    Irregular periods/menstrual cycles    Lymphadenopathy    Pregnancy induced hypertension     Family History  Problem Relation Age of Onset   Cancer Father        ???    Multiple sclerosis Mother    Colon cancer Maternal Grandmother    Colon cancer Maternal Aunt    Allergic rhinitis Neg Hx    Angioedema Neg Hx    Asthma Neg Hx    Eczema Neg Hx    Immunodeficiency Neg Hx    Urticaria Neg Hx    Past Surgical History:  Procedure Laterality Date   IR BONE MARROW BIOPSY & ASPIRATION  11/22/2023   LAPAROSCOPIC OVARIAN CYSTECTOMY Right 01/24/2015   Procedure: LAPAROSCOPIC LYSIS OF ADHESION, LAPROSCOPIC RIGHT SALPINGOOOPHORECTOMY, BIOPSY OF RIGHT BOARD LIGAMENT; LYSIS BIOPSY OF ADHESIONS, BIOPSY OF LEFT UTERO LIGAMENT;  Surgeon: Rome Rigg, MD;  Location: [facility];  Service: Gynecology;  Laterality: Right;   LAPAROSCOPY  11/08/2011   Procedure: LAPAROSCOPY OPERATIVE;  Surgeon: Oneil FORBES Piety, MD;  Location: [facility];  Service: Gynecology;  Laterality: Right;   LYMPH NODE BIOPSY Right 11/28/2023   Procedure: LYMPH NODE BIOPSY;  Surgeon: Okey Burns, MD;  Location: [facility];  Service: ENT;  Laterality: Right;   OVARIAN CYST REMOVAL  11/08/2011   Procedure: OVARIAN CYSTECTOMY;  Surgeon: Oneil FORBES Piety, MD;  Location: [facility];  Service: Gynecology;  Laterality: Right;   TONSILLECTOMY Bilateral 11/28/2023   Procedure: TONSILLECTOMY;  Surgeon: Okey Burns, MD;  Location: [facility];  Service: ENT;  Laterality: Bilateral;   TONSILLECTOMY     UNILATERAL SALPINGECTOMY Right 01/24/2015   Social History   Tobacco Use   Smoking status: Former    Current packs/day: 0.00    Types: Cigarettes    Quit date: 11/30/2013    Years since quitting: 10.4  Smokeless tobacco: Never  Vaping Use   Vaping status: Every Day   Substances: Nicotine, Flavoring  Substance Use Topics   Alcohol use: Yes    Comment: social, not while pregnant   Drug use: No   Social History   Social History Narrative   Not on file     Immunization History  Administered Date(s) Administered   Influenza,inj,Quad PF,6+ Mos 04/25/2019   MMR  09/03/2019   PPD Test 11/13/2009   Tdap 09/15/2015     Objective: Vital Signs: There were no vitals taken for this visit.   Physical Exam   Musculoskeletal Exam: ***  CDAI Exam: CDAI Score: -- Patient Global: --; Provider Global: -- Swollen: --; Tender: -- Joint Exam 05/07/2024   No joint exam has been documented for this visit   There is currently no information documented on the homunculus. Go to the Rheumatology activity and complete the homunculus joint exam.  Investigation: No additional findings.  Imaging: No results found.  Recent Labs: Lab Results  Component Value Date   WBC 9.3 11/22/2023   HGB 13.2 11/22/2023   PLT 362 11/22/2023   NA 138 11/03/2023   K 3.9 11/03/2023   CL 107 11/03/2023   CO2 25 11/03/2023   GLUCOSE 101 (H) 11/03/2023   BUN 10 11/03/2023   CREATININE 0.78 11/03/2023   BILITOT 0.6 11/03/2023   ALKPHOS 65 11/03/2023   AST 13 (L) 11/03/2023   ALT 21 11/03/2023   PROT 7.6 11/03/2023   ALBUMIN 4.5 11/03/2023   CALCIUM  9.5 11/03/2023   GFRAA 136 02/13/2020    Speciality Comments: No specialty comments available.  Procedures:  No procedures performed Allergies: Avocado, Banana, Gadolinium derivatives, Iodinated contrast media, Kiwi extract, and Latex   Assessment / Plan:     Visit Diagnoses: No diagnosis found.  Orders: No orders of the defined types were placed in this encounter.  No orders of the defined types were placed in this encounter.   Face-to-face time spent with patient was *** minutes. Greater than 50% of time was spent in counseling and coordination of care.  Follow-Up Instructions: No follow-ups on file.   Lonni LELON Ester, MD  Note - This record has been created using [employer].  Chart creation errors have been sought, but may not always  have been located. Such creation errors do not reflect on  the standard of medical care.

## 2024-05-07 ENCOUNTER — Encounter: Admitting: Internal Medicine

## 2024-06-06 ENCOUNTER — Encounter (HOSPITAL_COMMUNITY): Payer: Self-pay | Admitting: Obstetrics and Gynecology

## 2024-06-06 ENCOUNTER — Encounter (HOSPITAL_COMMUNITY): Payer: Self-pay

## 2024-06-06 NOTE — Progress Notes (Signed)
 Spoke w/ via phone for pre-op interview--- pt Lab needs dos---- cbc/ t&s/ upt        Lab results------ no COVID test -----patient states asymptomatic no test needed Arrive at ------- 0530 on 06-11-2024 NPO after MN w/ exception sips of water w/ meds Pre-Surgery Ensure or G2: n/a  Med rec completed Medications to take morning of surgery ----- orilissa/ if needed xyzal  Diabetic medication ----- n/a  GLP1 agonist last dose: n/a GLP1 instructions:  Patient instructed no nail polish to be worn day of surgery Patient instructed to bring photo id and insurance card day of surgery Patient aware to have Driver (ride ) / caregiver    for 24 hours after surgery - husband, Chelsea Day Patient Special Instructions ----- sent surgical instructions to patient MyChart account in epic. Pt verbalized understanding without further questions and will get hibiclens soap at any pharmacy. Pre-Op special Instructions -----  n/a  Patient verbalized understanding of instructions that were given at this phone interview. Patient denies chest pain, sob, fever, cough at the interview.

## 2024-06-07 ENCOUNTER — Encounter (HOSPITAL_COMMUNITY): Payer: Self-pay

## 2024-06-08 NOTE — H&P (Signed)
 Gynecology History and Physical Pre-procedure H&P for scheduled LAVH, LS, possible conversion to open.  Chelsea Day is a 28 y.o. female [phone] with a longstanding history of pelvic pain and abnormal uterine bleeding. She has tried multiple hormonal therapies, most recently Nexplanon which was no longer effective and is currently on Orilissa to bridge to hysterectomy.   She has a relevant surgical history of laparoscopic right ovarian cystectomy in 2013, diagnostic laparoscopy, lysis of adhesions, RSO in 2016, and laparoscopic appendectomy in 2023.  Her mother was recently noted to be BRCA-1 positive, Chelsea Day was tested and negative ([facility], no clinically significant mutations identified).  She has also been seen recently for ongoing hematology and allergist workup for ongoing flushing, diarrhea, food allergies. There has been suspicion of mast cell activation syndrome, however bone marrow biopsy for definitive diagnosis was negative. She has also had lymph node biopsies (which were enlarged on imaging) that were negative. She has tolerated anesthesia during the aforementioned procedures and biopsies.    OB History     Gravida  3   Para  2   Term  1   Preterm  1   AB  1   Living  2      SAB  1   IAB      Ectopic      Multiple  0   Live Births  2          Past Medical History:  Diagnosis Date   Abnormal uterine bleeding (AUB)    Chronic pelvic pain in female    Dyspareunia in female    Endometriosis determined by laparoscopy    History of allergic urticaria    History of postpartum uterine bleeding 2018   History of pre-eclampsia 2018   and  2021   Irregular periods/menstrual cycles    Lymphadenopathy    pt had extensive work-up done by oncology-- dr federico, all normal including bone marrow bx   Wears glasses    Past Surgical History:  Procedure Laterality Date   IR BONE MARROW BIOPSY & ASPIRATION  11/22/2023   LAPAROSCOPIC APPENDECTOMY  01/07/2022    @[facility]  by Dr CHRISTELLA. Arafeh   LAPAROSCOPIC OVARIAN CYSTECTOMY Right 01/24/2015   Procedure: LAPAROSCOPIC LYSIS OF ADHESION, LAPROSCOPIC RIGHT SALPINGOOOPHORECTOMY, BIOPSY OF RIGHT BOARD LIGAMENT; LYSIS BIOPSY OF ADHESIONS, BIOPSY OF LEFT UTERO LIGAMENT;  Surgeon: Rome Rigg, MD;  Location: [facility];  Service: Gynecology;  Laterality: Right;   LAPAROSCOPY  11/08/2011   Procedure: LAPAROSCOPY OPERATIVE;  Surgeon: Oneil FORBES Piety, MD;  Location: [facility];  Service: Gynecology;  Laterality: Right;   LYMPH NODE BIOPSY Right 11/28/2023   Procedure: LYMPH NODE BIOPSY;  Surgeon: Okey Burns, MD;  Location: [facility];  Service: ENT;  Laterality: Right;   OVARIAN CYST REMOVAL  11/08/2011   Procedure: OVARIAN CYSTECTOMY;  Surgeon: Oneil FORBES Piety, MD;  Location: [facility];  Service: Gynecology;  Laterality: Right;   TONSILLECTOMY Bilateral 11/28/2023   Procedure: TONSILLECTOMY;  Surgeon: Okey Burns, MD;  Location: [facility];  Service: ENT;  Laterality: Bilateral;    AND THREE LYMPH NODES REMOVED   Family History: family history includes Cancer in her father; Colon cancer in her maternal aunt and maternal grandmother; Multiple sclerosis in her mother. Social History:  reports that she quit smoking about 10 years ago. Her smoking use included cigarettes. She has never used smokeless tobacco. She reports current alcohol use. She reports that she does not use drugs.   Review of Systems -  Patient denies fever, chills, SOB, CP, N/V/D.  History   Height 5' 10 (1.778 m), weight 124.7 kg. Exam Physical Exam   Gen: alert, well appearing, no distress Chest: nonlabored breathing CV: no peripheral edema Abdomen: soft, nontender Ext: no evidence of DVT    Assessment/Plan: Admit for planned procedure: LAVH LS, possible open She has exhausted medical hormonal options. Chelsea Day has been more helpful, but incomplete and not optimal for long-term therapy. I discussed our  approach would include LAVH and left salpingectomy (she is s/p RSO). There a likelihood of conversion to open approach pending adhesive disease. Risks also include but are not limited to bleeding, infection, damage to nearby organs including bowel and bladder.  Will reiterate these risks, as well as recovery, on day on surgery.  Chelsea Day 06/08/2024, 8:16 AM

## 2024-06-10 NOTE — Anesthesia Preprocedure Evaluation (Signed)
 Anesthesia Evaluation  Patient identified by MRN, date of birth, ID band Patient awake    Reviewed: Allergy & Precautions, NPO status , Patient's Chart, lab work & pertinent test results  History of Anesthesia Complications Negative for: history of anesthetic complications  Airway Mallampati: II  TM Distance: >3 FB Neck ROM: Full   Comment: Previous grade I view with MAC 3, easy mask Dental  (+) Dental Advisory Given   Pulmonary neg shortness of breath, neg sleep apnea, neg COPD, neg recent URI, Patient abstained from smoking., former smoker   Pulmonary exam normal breath sounds clear to auscultation       Cardiovascular (-) hypertension(-) angina (-) Past MI, (-) Cardiac Stents and (-) CABG negative cardio ROS (-) dysrhythmias  Rhythm:Regular Rate:Normal     Neuro/Psych negative neurological ROS     GI/Hepatic negative GI ROS, Neg liver ROS,,,  Endo/Other  negative endocrine ROS    Renal/GU negative Renal ROS     Musculoskeletal   Abdominal  (+) + obese  Peds  Hematology negative hematology ROS (+)   Anesthesia Other Findings   Reproductive/Obstetrics AUB, chronic pelvic pain, endometriosis                               Anesthesia Physical Anesthesia Plan  ASA: 2  Anesthesia Plan: General   Post-op Pain Management: Tylenol  PO (pre-op)* and Ketamine IV*   Induction: Intravenous  PONV Risk Score and Plan: 3 and Ondansetron , Dexamethasone , Midazolam , Scopolamine  patch - Pre-op and Treatment may vary due to age or medical condition  Airway Management Planned: Oral ETT  Additional Equipment:   Intra-op Plan:   Post-operative Plan: Extubation in OR  Informed Consent: I have reviewed the patients History and Physical, chart, labs and discussed the procedure including the risks, benefits and alternatives for the proposed anesthesia with the patient or authorized representative who  has indicated his/her understanding and acceptance.     Dental advisory given  Plan Discussed with: CRNA and Anesthesiologist  Anesthesia Plan Comments: (Risks of general anesthesia discussed including, but not limited to, sore throat, hoarse voice, chipped/damaged teeth, injury to vocal cords, nausea and vomiting, allergic reactions, lung infection, heart attack, stroke, and death. All questions answered. )         Anesthesia Quick Evaluation

## 2024-06-11 ENCOUNTER — Observation Stay (HOSPITAL_COMMUNITY): Payer: Self-pay | Admitting: Anesthesiology

## 2024-06-11 ENCOUNTER — Encounter (HOSPITAL_COMMUNITY): Payer: Self-pay | Admitting: Obstetrics and Gynecology

## 2024-06-11 ENCOUNTER — Observation Stay (HOSPITAL_COMMUNITY)
Admission: RE | Admit: 2024-06-11 | Discharge: 2024-06-12 | Disposition: A | Discharge status: Home / Self Care | Attending: Obstetrics and Gynecology | Admitting: Obstetrics and Gynecology

## 2024-06-11 ENCOUNTER — Encounter (HOSPITAL_COMMUNITY)
Admission: RE | Disposition: A | Discharge status: Home / Self Care | Payer: Self-pay | Source: Home / Self Care | Attending: Obstetrics and Gynecology

## 2024-06-11 ENCOUNTER — Other Ambulatory Visit: Payer: Self-pay

## 2024-06-11 DIAGNOSIS — R102 Pelvic and perineal pain unspecified side: Secondary | ICD-10-CM | POA: Diagnosis present

## 2024-06-11 DIAGNOSIS — Z87891 Personal history of nicotine dependence: Secondary | ICD-10-CM | POA: Diagnosis not present

## 2024-06-11 DIAGNOSIS — N939 Abnormal uterine and vaginal bleeding, unspecified: Principal | ICD-10-CM | POA: Diagnosis present

## 2024-06-11 DIAGNOSIS — Z01818 Encounter for other preprocedural examination: Secondary | ICD-10-CM

## 2024-06-11 DIAGNOSIS — N888 Other specified noninflammatory disorders of cervix uteri: Secondary | ICD-10-CM | POA: Insufficient documentation

## 2024-06-11 HISTORY — PX: CYSTOSCOPY: SHX5120

## 2024-06-11 HISTORY — DX: Other chronic pain: G89.29

## 2024-06-11 HISTORY — DX: Personal history of diseases of the skin and subcutaneous tissue: Z87.2

## 2024-06-11 HISTORY — PX: LAPAROSCOPIC VAGINAL HYSTERECTOMY WITH SALPINGECTOMY: SHX6680

## 2024-06-11 HISTORY — DX: Pelvic and perineal pain unspecified side: R10.20

## 2024-06-11 HISTORY — DX: Presence of spectacles and contact lenses: Z97.3

## 2024-06-11 HISTORY — DX: Abnormal uterine and vaginal bleeding, unspecified: N93.9

## 2024-06-11 HISTORY — DX: Unspecified dyspareunia: N94.10

## 2024-06-11 LAB — CBC
HCT: 38.7 % (ref 36.0–46.0)
Hemoglobin: 12.8 g/dL (ref 12.0–15.0)
MCH: 28.3 pg (ref 26.0–34.0)
MCHC: 33.1 g/dL (ref 30.0–36.0)
MCV: 85.4 fL (ref 80.0–100.0)
Platelets: 346 K/uL (ref 150–400)
RBC: 4.53 MIL/uL (ref 3.87–5.11)
RDW: 13.4 % (ref 11.5–15.5)
WBC: 11.5 K/uL — ABNORMAL HIGH (ref 4.0–10.5)
nRBC: 0 % (ref 0.0–0.2)

## 2024-06-11 LAB — TYPE AND SCREEN
ABO/RH(D): A POS
Antibody Screen: NEGATIVE

## 2024-06-11 LAB — POCT PREGNANCY, URINE: Preg Test, Ur: NEGATIVE

## 2024-06-11 SURGERY — HYSTERECTOMY, VAGINAL, LAPAROSCOPY-ASSISTED, WITH SALPINGECTOMY
Anesthesia: General

## 2024-06-11 MED ORDER — CHLORHEXIDINE GLUCONATE 0.12 % MT SOLN
OROMUCOSAL | Status: AC
Start: 2024-06-11 — End: 2024-06-11
  Filled 2024-06-11: qty 15

## 2024-06-11 MED ORDER — AMISULPRIDE (ANTIEMETIC) 5 MG/2ML IV SOLN: 10.0000 mg | Freq: Once | INTRAVENOUS | Status: DC | PRN

## 2024-06-11 MED ORDER — SIMETHICONE 80 MG PO CHEW
80.0000 mg | CHEWABLE_TABLET | Freq: Four times a day (QID) | ORAL | Status: DC | PRN
Start: 2024-06-11 — End: 2024-06-12

## 2024-06-11 MED ORDER — ACETAMINOPHEN 500 MG PO TABS
ORAL_TABLET | ORAL | Status: AC
  Filled 2024-06-11: qty 2

## 2024-06-11 MED ORDER — PROPOFOL 10 MG/ML IV BOLUS
INTRAVENOUS | Status: AC
Start: 2024-06-11 — End: 2024-06-11
  Filled 2024-06-11: qty 20

## 2024-06-11 MED ORDER — DEXTROSE-SODIUM CHLORIDE 5-0.45 % IV SOLN: INTRAVENOUS | Status: DC

## 2024-06-11 MED ORDER — ZOLPIDEM TARTRATE 5 MG PO TABS: 5.0000 mg | ORAL_TABLET | Freq: Every evening | ORAL | Status: DC | PRN

## 2024-06-11 MED ORDER — OXYCODONE HCL 5 MG PO TABS
5.0000 mg | ORAL_TABLET | ORAL | Status: DC | PRN
  Administered 2024-06-11 – 2024-06-12 (×3): 10 mg via ORAL
  Filled 2024-06-11 (×3): qty 2

## 2024-06-11 MED ORDER — IBUPROFEN 600 MG PO TABS: 600.0000 mg | ORAL_TABLET | Freq: Four times a day (QID) | ORAL | 0 refills | Status: AC

## 2024-06-11 MED ORDER — CEFAZOLIN SODIUM-DEXTROSE 3-4 GM/150ML-% IV SOLN
3.0000 g | INTRAVENOUS | Status: AC
  Administered 2024-06-11: 3 g via INTRAVENOUS

## 2024-06-11 MED ORDER — SENNOSIDES-DOCUSATE SODIUM 8.6-50 MG PO TABS: 1.0000 | ORAL_TABLET | Freq: Every evening | ORAL | Status: DC | PRN

## 2024-06-11 MED ORDER — BUPIVACAINE HCL (PF) 0.5 % IJ SOLN
INTRAMUSCULAR | Status: DC | PRN
  Administered 2024-06-11: 4 mL

## 2024-06-11 MED ORDER — FENTANYL CITRATE (PF) 100 MCG/2ML IJ SOLN
INTRAMUSCULAR | Status: AC
  Filled 2024-06-11: qty 2

## 2024-06-11 MED ORDER — HYDROMORPHONE HCL 1 MG/ML IJ SOLN
INTRAMUSCULAR | Status: DC | PRN
  Administered 2024-06-11 (×2): .5 mg via INTRAVENOUS

## 2024-06-11 MED ORDER — DEXMEDETOMIDINE HCL IN NACL 80 MCG/20ML IV SOLN
INTRAVENOUS | Status: DC | PRN
  Administered 2024-06-11: 4 ug via INTRAVENOUS
  Administered 2024-06-11: 8 ug via INTRAVENOUS

## 2024-06-11 MED ORDER — ALUM & MAG HYDROXIDE-SIMETH 200-200-20 MG/5ML PO SUSP: 30.0000 mL | ORAL | Status: DC | PRN

## 2024-06-11 MED ORDER — SCOPOLAMINE 1 MG/3DAYS TD PT72
1.0000 | MEDICATED_PATCH | TRANSDERMAL | Status: DC
  Administered 2024-06-11: 1 mg via TRANSDERMAL

## 2024-06-11 MED ORDER — ACETAMINOPHEN 325 MG PO TABS: 650.0000 mg | ORAL_TABLET | ORAL | 0 refills | Status: DC | PRN

## 2024-06-11 MED ORDER — HYDROMORPHONE HCL 1 MG/ML IJ SOLN
INTRAMUSCULAR | Status: AC
  Filled 2024-06-11: qty 0.5

## 2024-06-11 MED ORDER — SUGAMMADEX SODIUM 200 MG/2ML IV SOLN
INTRAVENOUS | Status: DC | PRN
  Administered 2024-06-11: 300 mg via INTRAVENOUS

## 2024-06-11 MED ORDER — IBUPROFEN 600 MG PO TABS
600.0000 mg | ORAL_TABLET | Freq: Four times a day (QID) | ORAL | Status: DC
  Administered 2024-06-11 – 2024-06-12 (×4): 600 mg via ORAL
  Filled 2024-06-11 (×4): qty 1

## 2024-06-11 MED ORDER — ORAL CARE MOUTH RINSE: 15.0000 mL | Freq: Once | OROMUCOSAL | Status: AC

## 2024-06-11 MED ORDER — CEFAZOLIN SODIUM-DEXTROSE 1-4 GM/50ML-% IV SOLN
INTRAVENOUS | Status: AC
  Filled 2024-06-11: qty 50

## 2024-06-11 MED ORDER — HEMOSTATIC AGENTS (NO CHARGE) OPTIME
TOPICAL | Status: DC | PRN
  Administered 2024-06-11: 1 via TOPICAL

## 2024-06-11 MED ORDER — HYDROMORPHONE HCL 1 MG/ML IJ SOLN
INTRAMUSCULAR | Status: AC
  Filled 2024-06-11: qty 1

## 2024-06-11 MED ORDER — OXYCODONE HCL 5 MG PO TABS: 5.0000 mg | ORAL_TABLET | Freq: Once | ORAL | Status: DC | PRN

## 2024-06-11 MED ORDER — LACTATED RINGERS IV SOLN: INTRAVENOUS | Status: DC

## 2024-06-11 MED ORDER — HYDROMORPHONE HCL 1 MG/ML IJ SOLN
0.2500 mg | INTRAMUSCULAR | Status: DC | PRN
  Administered 2024-06-11 (×3): 0.5 mg via INTRAVENOUS

## 2024-06-11 MED ORDER — TRANEXAMIC ACID 1000 MG/10ML IV SOLN
INTRAVENOUS | Status: DC | PRN
  Administered 2024-06-11: 1000 mg via INTRAVENOUS

## 2024-06-11 MED ORDER — LACTATED RINGERS IV SOLN
INTRAVENOUS | Status: DC | PRN
Start: 2024-06-11 — End: 2024-06-11

## 2024-06-11 MED ORDER — POVIDONE-IODINE 10 % EX SWAB: 2.0000 | Freq: Once | CUTANEOUS | Status: DC

## 2024-06-11 MED ORDER — ONDANSETRON HCL 4 MG/2ML IJ SOLN: 4.0000 mg | Freq: Four times a day (QID) | INTRAMUSCULAR | Status: DC | PRN

## 2024-06-11 MED ORDER — PROPOFOL 10 MG/ML IV BOLUS
INTRAVENOUS | Status: DC | PRN
  Administered 2024-06-11: 200 mg via INTRAVENOUS

## 2024-06-11 MED ORDER — GABAPENTIN 300 MG PO CAPS
300.0000 mg | ORAL_CAPSULE | Freq: Two times a day (BID) | ORAL | Status: DC
  Administered 2024-06-11: 300 mg via ORAL
  Filled 2024-06-11: qty 1

## 2024-06-11 MED ORDER — CHLORHEXIDINE GLUCONATE 0.12 % MT SOLN
15.0000 mL | Freq: Once | OROMUCOSAL | Status: AC
  Administered 2024-06-11: 15 mL via OROMUCOSAL

## 2024-06-11 MED ORDER — TRANEXAMIC ACID-NACL 1000-0.7 MG/100ML-% IV SOLN
INTRAVENOUS | Status: AC
Start: 2024-06-11 — End: 2024-06-11
  Filled 2024-06-11: qty 100

## 2024-06-11 MED ORDER — MIDAZOLAM HCL (PF) 2 MG/2ML IJ SOLN
INTRAMUSCULAR | Status: DC | PRN
  Administered 2024-06-11: 2 mg via INTRAVENOUS

## 2024-06-11 MED ORDER — ROCURONIUM BROMIDE 10 MG/ML (PF) SYRINGE
PREFILLED_SYRINGE | INTRAVENOUS | Status: DC | PRN
  Administered 2024-06-11: 40 mg via INTRAVENOUS
  Administered 2024-06-11: 60 mg via INTRAVENOUS
  Administered 2024-06-11: 20 mg via INTRAVENOUS

## 2024-06-11 MED ORDER — ONDANSETRON HCL 4 MG/2ML IJ SOLN
INTRAMUSCULAR | Status: DC | PRN
  Administered 2024-06-11: 4 mg via INTRAVENOUS

## 2024-06-11 MED ORDER — SODIUM CHLORIDE 0.9 % IR SOLN
Status: DC | PRN
  Administered 2024-06-11: 1000 mL
  Administered 2024-06-11: 1000 mL via INTRAVESICAL

## 2024-06-11 MED ORDER — ACETAMINOPHEN 500 MG PO TABS
1000.0000 mg | ORAL_TABLET | Freq: Once | ORAL | Status: AC
  Administered 2024-06-11: 1000 mg via ORAL

## 2024-06-11 MED ORDER — 0.9 % SODIUM CHLORIDE (POUR BTL) OPTIME
TOPICAL | Status: DC | PRN
  Administered 2024-06-11: 1000 mL

## 2024-06-11 MED ORDER — MENTHOL 3 MG MT LOZG: 1.0000 | LOZENGE | OROMUCOSAL | Status: DC | PRN

## 2024-06-11 MED ORDER — SCOPOLAMINE 1 MG/3DAYS TD PT72
MEDICATED_PATCH | TRANSDERMAL | Status: AC
  Filled 2024-06-11: qty 1

## 2024-06-11 MED ORDER — BUPIVACAINE HCL (PF) 0.5 % IJ SOLN
INTRAMUSCULAR | Status: AC
  Filled 2024-06-11: qty 30

## 2024-06-11 MED ORDER — LIDOCAINE 2% (20 MG/ML) 5 ML SYRINGE
INTRAMUSCULAR | Status: DC | PRN
  Administered 2024-06-11: 100 mg via INTRAVENOUS

## 2024-06-11 MED ORDER — CEFAZOLIN SODIUM-DEXTROSE 2-4 GM/100ML-% IV SOLN
INTRAVENOUS | Status: AC
  Filled 2024-06-11: qty 100

## 2024-06-11 MED ORDER — FENTANYL CITRATE (PF) 250 MCG/5ML IJ SOLN
INTRAMUSCULAR | Status: DC | PRN
  Administered 2024-06-11 (×2): 100 ug via INTRAVENOUS

## 2024-06-11 MED ORDER — PROPOFOL 10 MG/ML IV BOLUS
INTRAVENOUS | Status: AC
  Filled 2024-06-11: qty 20

## 2024-06-11 MED ORDER — DEXAMETHASONE SOD PHOSPHATE PF 10 MG/ML IJ SOLN
INTRAMUSCULAR | Status: DC | PRN
  Administered 2024-06-11: 10 mg via INTRAVENOUS

## 2024-06-11 MED ORDER — ONDANSETRON HCL 4 MG PO TABS: 4.0000 mg | ORAL_TABLET | Freq: Four times a day (QID) | ORAL | Status: DC | PRN

## 2024-06-11 MED ORDER — OXYCODONE HCL 5 MG PO TABS: 5.0000 mg | ORAL_TABLET | ORAL | 0 refills | Status: AC | PRN

## 2024-06-11 MED ORDER — ACETAMINOPHEN 325 MG PO TABS
650.0000 mg | ORAL_TABLET | ORAL | Status: DC | PRN
  Administered 2024-06-11 (×2): 650 mg via ORAL
  Filled 2024-06-11 (×2): qty 2

## 2024-06-11 MED ORDER — OXYCODONE HCL 5 MG/5ML PO SOLN: 5.0000 mg | Freq: Once | ORAL | Status: DC | PRN

## 2024-06-11 MED ORDER — MIDAZOLAM HCL 2 MG/2ML IJ SOLN
INTRAMUSCULAR | Status: AC
Start: 2024-06-11 — End: 2024-06-11
  Filled 2024-06-11: qty 2

## 2024-06-11 SURGICAL SUPPLY — 41 items
ANCHOR CATH FOLEY SECURE (MISCELLANEOUS) IMPLANT
APPLICATOR ARISTA FLEXITIP XL (MISCELLANEOUS) IMPLANT
COVER BACK TABLE 60X90IN (DRAPES) ×2 IMPLANT
COVER MAYO STAND STRL (DRAPES) ×4 IMPLANT
DERMABOND ADVANCED .7 DNX12 (GAUZE/BANDAGES/DRESSINGS) ×2 IMPLANT
DRAPE SURG IRRIG POUCH 19X23 (DRAPES) ×2 IMPLANT
DRSG OPSITE POSTOP 3X4 (GAUZE/BANDAGES/DRESSINGS) ×2 IMPLANT
DURAPREP 26ML APPLICATOR (WOUND CARE) ×2 IMPLANT
ELECTRODE REM PT RTRN 9FT ADLT (ELECTROSURGICAL) IMPLANT
GAUZE 4X4 16PLY ~~LOC~~+RFID DBL (SPONGE) IMPLANT
GLOVE SURG UNDER POLY LF SZ7 (GLOVE) ×4 IMPLANT
GOWN STRL REUS W/ TWL XL LVL3 (GOWN DISPOSABLE) ×4 IMPLANT
HEMOSTAT ARISTA ABSORB 3G PWDR (HEMOSTASIS) IMPLANT
IRRIGATION SUCT STRKRFLW 2 WTP (MISCELLANEOUS) IMPLANT
IV 0.9% NACL 1000 ML (IV SOLUTION) IMPLANT
KIT PINK PAD W/HEAD ARM REST (MISCELLANEOUS) ×2 IMPLANT
KIT TURNOVER KIT B (KITS) ×2 IMPLANT
LEGGING LITHOTOMY PAIR STRL (DRAPES) ×2 IMPLANT
LIGASURE BLUNT TIP 5 LONG 44CM (ELECTROSURGICAL) IMPLANT
LIGASURE IMPACT 36 18CM CVD LR (INSTRUMENTS) IMPLANT
LIGASURE VESSEL 5MM BLUNT TIP (ELECTROSURGICAL) ×2 IMPLANT
PACK LAVH (CUSTOM PROCEDURE TRAY) ×2 IMPLANT
SCISSORS LAP 5X45 EPIX DISP (ENDOMECHANICALS) IMPLANT
SET CYSTO IRRIGATION (SET/KITS/TRAYS/PACK) IMPLANT
SET TUBE SMOKE EVAC HIGH FLOW (TUBING) ×2 IMPLANT
SLEEVE Z-THREAD 5X100MM (TROCAR) IMPLANT
SOLN 0.9% NACL POUR BTL 1000ML (IV SOLUTION) ×2 IMPLANT
SPECIMEN JAR MEDIUM (MISCELLANEOUS) ×2 IMPLANT
SUT MNCRL 0 MO-4 VIOLET 18 CR (SUTURE) ×2 IMPLANT
SUT MNCRL 0 VIOLET 6X18 (SUTURE) ×2 IMPLANT
SUT MON AB 4-0 PS1 27 (SUTURE) ×2 IMPLANT
SUT VIC AB 0 CT1 27XCR 8 STRN (SUTURE) IMPLANT
SUT VIC AB 0 CT2 27 (SUTURE) IMPLANT
SUT VICRYL 0 UR6 27IN ABS (SUTURE) ×2 IMPLANT
SYR 10ML LL (SYRINGE) IMPLANT
TOWEL GREEN STERILE FF (TOWEL DISPOSABLE) ×4 IMPLANT
TRAY FOLEY W/BAG SLVR 14FR (SET/KITS/TRAYS/PACK) ×2 IMPLANT
TROCAR 11X100 Z THREAD (TROCAR) ×2 IMPLANT
TROCAR XCEL NON-BLD 5MMX100MML (ENDOMECHANICALS) ×2 IMPLANT
UNDERPAD 30X36 HEAVY ABSORB (UNDERPADS AND DIAPERS) ×2 IMPLANT
WARMER LAPAROSCOPE (MISCELLANEOUS) ×2 IMPLANT

## 2024-06-11 NOTE — H&P (Signed)
 Gynecology H&P  No updates to above H&P  Patient feels well this morning. [name] has been partially helpful in reduction of pain, but with significant and disruptive vasomotor and mood-related symptoms.   BP 123/71   Pulse 68   Temp 98.2 F (36.8 C) (Oral)   Resp 16   Ht 5' 10 (1.778 m)   Wt 122.5 kg   LMP 05/28/2024 (Approximate)   SpO2 98%   BMI 38.74 kg/m   Assessment/Plan: Admit for planned procedure: LAVH LS, possible open She has exhausted medical hormonal options. [name] has been more helpful, but incomplete and not optimal for long-term therapy. I discussed our approach would include LAVH and left salpingectomy (she is s/p RSO). There a likelihood of conversion to open approach pending adhesive disease. Risks also include but are not limited to bleeding, infection, damage to nearby organs including bowel and bladder.

## 2024-06-11 NOTE — Transfer of Care (Signed)
 Immediate Anesthesia Transfer of Care Note  Patient: Chelsea Day  Procedure(s) Performed: HYSTERECTOMY, VAGINAL, LAPAROSCOPY-ASSISTED, WITH LEFT SALPINGECTOMY CYSTOSCOPY  Patient Location: PACU  Anesthesia Type:General  Level of Consciousness: awake, alert , and oriented  Airway & Oxygen Therapy: Patient Spontanous Breathing and Patient connected to nasal cannula oxygen  Post-op Assessment: Report given to RN and Post -op Vital signs reviewed and stable  Post vital signs: Reviewed and stable  Last Vitals:  Vitals Value Taken Time  BP 140/65 06/11/24 10:34  Temp 98 1035  Pulse 93 06/11/24 10:35  Resp 16 06/11/24 10:35  SpO2 98 % 06/11/24 10:35  Vitals shown include unfiled device data.  Last Pain:  Vitals:   06/11/24 0605  TempSrc: Oral  PainSc: 3       Patients Stated Pain Goal: 3 (06/11/24 9394)  Complications: No notable events documented.

## 2024-06-11 NOTE — Anesthesia Procedure Notes (Signed)
 Procedure Name: Intubation Date/Time: 06/11/2024 7:35 AM  Performed by: Scherrie Mast, CRNAPre-anesthesia Checklist: Patient identified, Emergency Drugs available, Suction available and Patient being monitored Patient Re-evaluated:Patient Re-evaluated prior to induction Oxygen Delivery Method: Circle System Utilized Preoxygenation: Pre-oxygenation with 100% oxygen Induction Type: IV induction Ventilation: Mask ventilation without difficulty Laryngoscope Size: Mac and 3 Grade View: Grade I Tube type: Oral Tube size: 7.0 mm Number of attempts: 1 Airway Equipment and Method: Stylet and Oral airway Placement Confirmation: ETT inserted through vocal cords under direct vision, positive ETCO2 and breath sounds checked- equal and bilateral Secured at: 22 cm Tube secured with: Tape Dental Injury: Teeth and Oropharynx as per pre-operative assessment

## 2024-06-11 NOTE — Anesthesia Postprocedure Evaluation (Signed)
 Anesthesia Post Note  Patient: Chelsea Day  Procedure(s) Performed: HYSTERECTOMY, VAGINAL, LAPAROSCOPY-ASSISTED, WITH LEFT SALPINGECTOMY CYSTOSCOPY     Patient location during evaluation: PACU Anesthesia Type: General Level of consciousness: awake Pain management: pain level controlled Vital Signs Assessment: post-procedure vital signs reviewed and stable Respiratory status: spontaneous breathing, nonlabored ventilation and respiratory function stable Cardiovascular status: blood pressure returned to baseline and stable Postop Assessment: no apparent nausea or vomiting Anesthetic complications: no   There were no known notable events for this encounter.  Last Vitals:  Vitals:   06/11/24 1159 06/11/24 1237  BP: 135/64 (!) 141/69  Pulse: 81 75  Resp: 18 16  Temp: 36.7 C 36.7 C  SpO2: 98% 96%    Last Pain:  Vitals:   06/11/24 1237  TempSrc: Oral  PainSc:                  Delon Aisha Arch

## 2024-06-11 NOTE — Op Note (Signed)
 Operative Note  PREOPERATIVE DIAGNOSES: 1. Pelvic Pain 2. Abnormal Uterine bleeding  POSTOPERATIVE DIAGNOSES: 1. Same, suspect pelvic congestion  PROCEDURE PERFORMED: Laparoscopic-assisted total vaginal hysterectomy with left salpingectomy, cystoscopy  SURGEON: Dr. Massie Smiles ASSISTANT: Dr. Lynwood Clubs  ANESTHESIA: General   ESTIMATED BLOOD LOSS: 500 cc.  URINE OUTPUT: clear urine at the end of the procedure.  FLUIDS: Per anesthesia record  COMPLICATIONS: None   TUBES: None.  DRAINS: Foley to gravity  PATHOLOGY: Uterus, cervix, left fallopian tube, were sent to pathology for review.  FINDINGS: On exam, under anesthesia, normal appearing vulva and vagina, a normal sized uterus.  Operative findings demonstrated a normal sized uterus. s/p right salpingo-oophorectomy, s/p appendectomy. The bowel and omentum were normal appearing.  Minimal adhesive disease noted. No obvious endometriosis implants.  Procedure: General anesthesia was induced and the patient was placed in the dirsal lithotomy position. The abdomen, perineum, and vagina were prepped and draped in the usual fashion. A foley catheter inserted into the bladder and attached to straight drainage. After the initial preparation, the procedure commenced at the vagina. With a speculum in place to visualize the cervix, the cervix was grasped and a Hulka tenaculum was placed within the uterine cavity for manipulation purposes being careful not to puncture the uterus. Attention was then turned to the abdomen.    Following infiltration with local anesthetic, an infraumbilical incision was made to accommodate a size 11 mm trocar and placed with direct visual entry. Once peritoneal cavity was entered, CO2 gas was introduced to insufflate the abdomen. Visualization of the peritoneal cavity was then obtained and a brief inspection did not reveal any signs of complications from entry. Under direct observation, 5mm suprapubic port was  placed taking care to respect anatomical landmarks and vessels. Once the placement of the port was complete, the laparoscopic procedure began.   Salpingectomy was completed on the left side with grasping the tube at the fimbriae and removed with ligasure ligation along the mesosalpinx.  Ureters bilaterally were identified.   The round ligament was then ligated and cut. Following this, the anterior leaf of the broad ligament was then taken down on the left side, dissecting down towards the peritoneal reflection at the base of the bladder and adjacent to the cervix. The same process was then repeated on the left side such that both sides met and the anterior leaflet had been appropriately skeletonized.  The left utero-ovarian ligament was sequentially ligated with ligasure and taken down on the left side, dissecting down towards the peritoneal reflection at the base of the bladder and adjacent to the cervix. The same process was then repeated on the right side such that both sides met and the anterior leaflet had been appropriately skeletonized. The right side was s/p RSO.  Attention was then turned to the vagina. The cervix was quite high in the vagina. Tenaculum was removed.  A weighted speculum was placed in the posterior vaginal vault. The cervix was grasped with a teneculum on both its anterior and posterior lips. With downward traction, we made a circumferential incision of the vaginal mucosa. This allowed dissection and entrance into the posterior cul-de-sac. The posterior peritoneum was sutured to the posterior vaginal cuff. At this time we visualized the uterosacral ligaments. These were clamped and ligated with #0 Monocryl suture. The cervicovesical space was then created by both blunt and sharp dissection, allowing us  to see the cardinal ligaments. These were clamped and ligated with #0 Monocryl suture as well. Once in the cervicovesical  space, we were then able to completely ligate the  uterosacral-cardinal ligament complex with #0 Monocryl suture. The anterior cul-de-sac was then entered sharply. Omentum and intestines were then visualized through the anterior cul-de-sac window and we began removal of the uterine body. The uterine arteries were then clamped and ligated with #0 Monocryl suture and carried upward toward the uterus until complete removal was obtained. Number 0 Monocryl suture was used on all major pedicles with Heaney clamping until the uterus was completely removed with left fallopian tube and cervix. We then removed the weighted duckbill speculum and placed a regular speculum in the vaginal vault and visualized the entire area. We inspected for hemostasis, and this was secured. A modified McCall's culdoplasty was performed by placing a stitch through the uterosacral ligament on the left, purstringing the posterior peritoneum and then placing a stitch through the right uterosacral ligament and tying it all together. Hemostasis was again noted. The vaginal cuff was closed with #0 vicryl in an interrupted fashion.  A Foley catheter was then removed after noting clear urine.   Cystoscopy was performed and bilateral ureteral jets of yellow urine was easily noted.   Attention then turned to the abdomen.  There was slight bleeding from the left pedicle, this was superficially cauterized with tip of the Ligasure. TXA was also given and arista was placed along the cuff and noted to be hemostatic and cuff intact from above. Hemostasis was again watched and confirmed on low flow. All gas removed from abdomen and all instruments were removed. Fascia was closed with 0' vicryl and skin closed with 4'0 vicryl. The sponge and lap counts were correct times 2 at this time.   The patient's procedure was terminated. We then awakened her. She was sent to the Recovery Room in good condition.   Massie Smiles MD

## 2024-06-12 ENCOUNTER — Encounter (HOSPITAL_COMMUNITY): Payer: Self-pay | Admitting: Obstetrics and Gynecology

## 2024-06-12 DIAGNOSIS — N939 Abnormal uterine and vaginal bleeding, unspecified: Secondary | ICD-10-CM | POA: Diagnosis not present

## 2024-06-12 LAB — CBC
HCT: 31.7 % — ABNORMAL LOW (ref 36.0–46.0)
Hemoglobin: 10.4 g/dL — ABNORMAL LOW (ref 12.0–15.0)
MCH: 29 pg (ref 26.0–34.0)
MCHC: 32.8 g/dL (ref 30.0–36.0)
MCV: 88.3 fL (ref 80.0–100.0)
Platelets: 296 K/uL (ref 150–400)
RBC: 3.59 MIL/uL — ABNORMAL LOW (ref 3.87–5.11)
RDW: 13.5 % (ref 11.5–15.5)
WBC: 14.4 K/uL — ABNORMAL HIGH (ref 4.0–10.5)
nRBC: 0 % (ref 0.0–0.2)

## 2024-06-12 LAB — SURGICAL PATHOLOGY

## 2024-06-12 NOTE — Progress Notes (Signed)
 Gynecology Progress Note  Chelsea Day is POD#1 s/p LAVH, LS, Cysto.  Subjective:  Patient reports no overnight events. Yesterday, she was unable to void following foley removal and underwent straight cath. Currently, she reports well controlled pain and did not require PRN overnight. She is  ambulating without difficulty, voiding spontaneously now (500 cc void last night), tolerating PO.  She reports Positive flatus, Negative BM.  Vaginal bleeding is absent.  Objective: Blood pressure (!) 100/38, pulse 63, temperature 98.1 F (36.7 C), temperature source Oral, resp. rate 16, height 5' 10 (1.778 m), weight 122.5 kg, last menstrual period 05/28/2024, SpO2 100%.  Physical Exam:  Gen: alert, well appearing, no distress Chest: nonlabored breathing CV: no peripheral edema Abdomen: soft, nondistended, ATTP Incision: clean/dry/intact Ext: No evidence of DVT  Recent Labs    06/11/24 0624 06/12/24 0439  HGB 12.8 10.4*  HCT 38.7 31.7*    Assessment/Plan: Postoperative day #1, s/p LAVH, LS, Cysto Urinary retention likely from foley and cystoscopy.  Now voiding spontaneously, discussed return of bladder function and precautions for urinary retention. Pain control improved since yesterday Appropriate drop in HGB with EBL and IVF hemodilution Doing well, continue routine care. Anticipate discharge home today.  Discussed activity restrictions and postoperative care. She is scheduled in office for post op visit in 1 week.   LOS: 0 days   Chelsea Day 06/12/2024, 6:48 AM

## 2024-06-14 NOTE — Discharge Summary (Signed)
 Gynecology Discharge Summary  Chelsea Day is a 28 y.o. female that presented on 06/11/2024 for scheduled LAVH, LS for acute on chronic pelvic pain not responsive to medical therapy.  She underwent her procedure as planned, with addition of cystoscopy and postoperative course was uncomplicated except for urinary retention requiring straight catheterization once.  On POD#1, she reported well controlled pain, ambulating without difficulty, voiding spontaneously.  She was discharged home in stable condition with plans for in-office follow up.   Hemoglobin  Date Value Ref Range Status  06/12/2024 10.4 (L) 12.0 - 15.0 g/dL Final  95/96/7974 85.9 12.0 - 15.0 g/dL Final  92/85/7978 86.8 11.1 - 15.9 g/dL Final   HCT  Date Value Ref Range Status  06/12/2024 31.7 (L) 36.0 - 46.0 % Final   Hematocrit  Date Value Ref Range Status  02/13/2020 40.8 34.0 - 46.6 % Final    Physical Exam:  Gen: alert, well appearing, no distress Chest: nonlabored breathing CV: no peripheral edema Abdomen: soft, nondistended, ATTP Ext: no evidence of DVT  Discharge Diagnoses: s/p LAVH, LS, cystoscopy  Discharge Information: Date: 06/14/2024 Activity: Pelvic rest, as tolerated Diet: routine Medications: Tylenol , motrin , oxycodone  Condition: stable Instructions: Discussed prior to discharge.  Discharge to: Home  Follow-up Information     [place] Follow up.   Why: Please follow up for a postoperative visit on 06/18/24. Contact information: [address] [place] [address] [phone]                  [name] 06/14/2024, 11:11 AM

## 2024-06-20 ENCOUNTER — Encounter: Payer: Self-pay | Admitting: Oncology

## 2024-07-05 ENCOUNTER — Emergency Department (HOSPITAL_COMMUNITY)

## 2024-07-05 ENCOUNTER — Encounter (HOSPITAL_COMMUNITY): Payer: Self-pay | Admitting: Obstetrics and Gynecology

## 2024-07-05 ENCOUNTER — Observation Stay (HOSPITAL_COMMUNITY)
Admission: EM | Admit: 2024-07-05 | Discharge: 2024-07-07 | DRG: 948 | Disposition: A | Source: Ambulatory Visit | Attending: Emergency Medicine | Admitting: Emergency Medicine

## 2024-07-05 ENCOUNTER — Other Ambulatory Visit: Payer: Self-pay

## 2024-07-05 DIAGNOSIS — Z87891 Personal history of nicotine dependence: Secondary | ICD-10-CM

## 2024-07-05 DIAGNOSIS — N760 Acute vaginitis: Secondary | ICD-10-CM | POA: Diagnosis present

## 2024-07-05 DIAGNOSIS — N730 Acute parametritis and pelvic cellulitis: Secondary | ICD-10-CM | POA: Diagnosis present

## 2024-07-05 DIAGNOSIS — T8140XA Infection following a procedure, unspecified, initial encounter: Principal | ICD-10-CM | POA: Diagnosis present

## 2024-07-05 DIAGNOSIS — R519 Headache, unspecified: Secondary | ICD-10-CM | POA: Diagnosis present

## 2024-07-05 DIAGNOSIS — Y836 Removal of other organ (partial) (total) as the cause of abnormal reaction of the patient, or of later complication, without mention of misadventure at the time of the procedure: Secondary | ICD-10-CM | POA: Diagnosis present

## 2024-07-05 DIAGNOSIS — G8918 Other acute postprocedural pain: Principal | ICD-10-CM | POA: Diagnosis present

## 2024-07-05 LAB — CBC WITH DIFFERENTIAL/PLATELET
Abs Immature Granulocytes: 0.04 K/uL (ref 0.00–0.07)
Basophils Absolute: 0.1 K/uL (ref 0.0–0.1)
Basophils Relative: 1 %
Eosinophils Absolute: 0.1 K/uL (ref 0.0–0.5)
Eosinophils Relative: 1 %
HCT: 41.4 % (ref 36.0–46.0)
Hemoglobin: 13.4 g/dL (ref 12.0–15.0)
Immature Granulocytes: 0 %
Lymphocytes Relative: 32 %
Lymphs Abs: 4.2 K/uL — ABNORMAL HIGH (ref 0.7–4.0)
MCH: 28.2 pg (ref 26.0–34.0)
MCHC: 32.4 g/dL (ref 30.0–36.0)
MCV: 87.2 fL (ref 80.0–100.0)
Monocytes Absolute: 0.9 K/uL (ref 0.1–1.0)
Monocytes Relative: 7 %
Neutro Abs: 7.9 K/uL — ABNORMAL HIGH (ref 1.7–7.7)
Neutrophils Relative %: 59 %
Platelets: 402 K/uL — ABNORMAL HIGH (ref 150–400)
RBC: 4.75 MIL/uL (ref 3.87–5.11)
RDW: 13.3 % (ref 11.5–15.5)
WBC: 13.2 K/uL — ABNORMAL HIGH (ref 4.0–10.5)
nRBC: 0 % (ref 0.0–0.2)

## 2024-07-05 LAB — I-STAT CHEM 8, ED
BUN: 12 mg/dL (ref 6–20)
Calcium, Ion: 1.21 mmol/L (ref 1.15–1.40)
Chloride: 108 mmol/L (ref 98–111)
Creatinine, Ser: 0.9 mg/dL (ref 0.44–1.00)
Glucose, Bld: 91 mg/dL (ref 70–99)
HCT: 43 % (ref 36.0–46.0)
Hemoglobin: 14.6 g/dL (ref 12.0–15.0)
Potassium: 4.1 mmol/L (ref 3.5–5.1)
Sodium: 141 mmol/L (ref 135–145)
TCO2: 21 mmol/L — ABNORMAL LOW (ref 22–32)

## 2024-07-05 LAB — COMPREHENSIVE METABOLIC PANEL WITH GFR
ALT: 34 U/L (ref 0–44)
AST: 24 U/L (ref 15–41)
Albumin: 4.8 g/dL (ref 3.5–5.0)
Alkaline Phosphatase: 93 U/L (ref 38–126)
Anion gap: 14 (ref 5–15)
BUN: 12 mg/dL (ref 6–20)
CO2: 22 mmol/L (ref 22–32)
Calcium: 10.1 mg/dL (ref 8.9–10.3)
Chloride: 106 mmol/L (ref 98–111)
Creatinine, Ser: 0.85 mg/dL (ref 0.44–1.00)
GFR, Estimated: >60 mL/min (ref >60)
Glucose, Bld: 83 mg/dL (ref 70–99)
Potassium: 4 mmol/L (ref 3.5–5.1)
Sodium: 142 mmol/L (ref 135–145)
Total Bilirubin: 0.4 mg/dL (ref 0.0–1.2)
Total Protein: 8.4 g/dL — ABNORMAL HIGH (ref 6.5–8.1)

## 2024-07-05 LAB — URINALYSIS, W/ REFLEX TO CULTURE (INFECTION SUSPECTED)
Bilirubin Urine: NEGATIVE
Glucose, UA: NEGATIVE mg/dL
Ketones, ur: NEGATIVE mg/dL
Nitrite: NEGATIVE
Protein, ur: NEGATIVE mg/dL
Specific Gravity, Urine: 1.024 (ref 1.005–1.030)
WBC, UA: >50 WBC/hpf (ref 0–5)
pH: 5 (ref 5.0–8.0)

## 2024-07-05 LAB — I-STAT CG4 LACTIC ACID, ED: Lactic Acid, Venous: 1.6 mmol/L (ref 0.5–1.9)

## 2024-07-05 MED ORDER — GUAIFENESIN 100 MG/5ML PO LIQD: 15.0000 mL | ORAL | Status: DC | PRN

## 2024-07-05 MED ORDER — LACTATED RINGERS IV SOLN: INTRAVENOUS | Status: DC

## 2024-07-05 MED ORDER — MENTHOL 3 MG MT LOZG
1.0000 | LOZENGE | OROMUCOSAL | Status: DC | PRN
  Administered 2024-07-06: 3 mg via ORAL
  Filled 2024-07-05: qty 9

## 2024-07-05 MED ORDER — PRENATAL MULTIVITAMIN CH
1.0000 | ORAL_TABLET | Freq: Every day | ORAL | Status: DC
  Administered 2024-07-05 – 2024-07-07 (×3): 1 via ORAL
  Filled 2024-07-05 (×3): qty 1

## 2024-07-05 MED ORDER — IBUPROFEN 400 MG PO TABS
600.0000 mg | ORAL_TABLET | Freq: Four times a day (QID) | ORAL | Status: DC
  Administered 2024-07-05 – 2024-07-07 (×7): 600 mg via ORAL
  Filled 2024-07-05: qty 1
  Filled 2024-07-05: qty 3
  Filled 2024-07-05 (×5): qty 1

## 2024-07-05 MED ORDER — PIPERACILLIN-TAZOBACTAM 3.375 G IVPB 30 MIN: 3.3750 g | Freq: Four times a day (QID) | INTRAVENOUS | Status: DC

## 2024-07-05 MED ORDER — ACETAMINOPHEN 325 MG PO TABS
650.0000 mg | ORAL_TABLET | ORAL | Status: DC | PRN
  Administered 2024-07-05: 650 mg via ORAL
  Filled 2024-07-05: qty 2

## 2024-07-05 MED ORDER — DOCUSATE SODIUM 100 MG PO CAPS: 100.0000 mg | ORAL_CAPSULE | Freq: Every day | ORAL | Status: DC | PRN

## 2024-07-05 MED ORDER — MORPHINE SULFATE (PF) 4 MG/ML IV SOLN
8.0000 mg | Freq: Once | INTRAVENOUS | Status: AC
  Administered 2024-07-05: 8 mg via INTRAVENOUS
  Filled 2024-07-05: qty 2

## 2024-07-05 MED ORDER — EPINEPHRINE 0.3 MG/0.3ML IJ SOAJ: 0.3000 mg | INTRAMUSCULAR | Status: DC | PRN

## 2024-07-05 MED ORDER — OXYCODONE HCL 5 MG PO TABS
5.0000 mg | ORAL_TABLET | ORAL | Status: DC | PRN
  Administered 2024-07-05 – 2024-07-07 (×6): 5 mg via ORAL
  Filled 2024-07-05 (×7): qty 1

## 2024-07-05 MED ORDER — HYDROMORPHONE HCL 1 MG/ML IJ SOLN
0.5000 mg | INTRAMUSCULAR | Status: DC | PRN
  Administered 2024-07-05 – 2024-07-07 (×7): 0.5 mg via INTRAVENOUS
  Filled 2024-07-05 (×7): qty 0.5

## 2024-07-05 MED ORDER — PIPERACILLIN-TAZOBACTAM 3.375 G IVPB
3.3750 g | Freq: Three times a day (TID) | INTRAVENOUS | Status: DC
  Administered 2024-07-06 – 2024-07-07 (×5): 3.375 g via INTRAVENOUS
  Filled 2024-07-05 (×5): qty 50

## 2024-07-05 MED ORDER — PIPERACILLIN-TAZOBACTAM 3.375 G IVPB 30 MIN
3.3750 g | Freq: Once | INTRAVENOUS | Status: AC
  Administered 2024-07-05: 3.375 g via INTRAVENOUS
  Filled 2024-07-05: qty 50

## 2024-07-05 MED ORDER — PIPERACILLIN-TAZOBACTAM 3.375 G IVPB
3.3750 g | Freq: Three times a day (TID) | INTRAVENOUS | Status: DC
  Administered 2024-07-05: 3.375 g via INTRAVENOUS
  Filled 2024-07-05 (×2): qty 50

## 2024-07-05 MED ORDER — LACTATED RINGERS IV BOLUS
1000.0000 mL | Freq: Once | INTRAVENOUS | Status: AC
  Administered 2024-07-05: 1000 mL via INTRAVENOUS

## 2024-07-05 NOTE — ED Triage Notes (Signed)
 Pt states that she had hysterectomy on 11/10 and has been having bloody discharge since then. Pt states that the discharge has become yellow/green and having a foul odor. Pt states that today she began running a fever and was directed to ED by [facility]

## 2024-07-05 NOTE — Plan of Care (Signed)
   Problem: Activity: Goal: Risk for activity intolerance will decrease Outcome: Progressing

## 2024-07-05 NOTE — ED Provider Notes (Signed)
 [facility] AT [facility] Provider Note   CSN: [id number] Arrival date & time: 07/05/24  1029     Patient presents with: Fever and Post-op Problem   Chelsea Day is a 28 y.o. female.   28 year old female presents with lower pelvic pain.  Patient had hysterectomy done approximately 3 weeks ago.  Patient states she has had scant vaginal bleeding but over the last few days she started to develop a green-yellowish discharge that had a foul order.  Saw her OB/GYN today who told her to come to the ED.  She has had nausea but no vomiting.  Endorses chills and rigors.  Denies any urine or URI symptoms.       Prior to Admission medications   Medication Sig Start Date End Date Taking? Authorizing Provider  acetaminophen  (TYLENOL ) 325 MG tablet Take 2 tablets (650 mg total) by mouth every 4 (four) hours as needed for mild pain (pain score 1-3) or fever (temperature > 101.5.). 06/11/24   Lequita Evalene LABOR, MD  ascorbic acid (VITAMIN C) 500 MG tablet Take 500 mg by mouth daily.    [provider]  ELDERBERRY PO Take 1 capsule by mouth daily.    [provider]  EPINEPHrine  0.3 mg/0.3 mL IJ SOAJ injection Inject 0.3 mg into the muscle as needed for anaphylaxis. 01/26/24   Jeneal Danita Macintosh, MD  ibuprofen  (ADVIL ) 600 MG tablet Take 1 tablet (600 mg total) by mouth every 6 (six) hours. 06/11/24   Lequita Evalene LABOR, MD  levocetirizine (XYZAL ) 5 MG tablet Take 1 tablet (5 mg total) by mouth 4 (four) times daily as needed for allergies. Patient taking differently: Take 5 mg by mouth 4 (four) times daily as needed for allergies. 01/26/24   Jeneal Danita Macintosh, MD  Multiple Vitamins-Minerals (MULTIVITAMIN GUMMIES ADULTS PO) Take 2 each by mouth daily.    [provider]  OVER THE COUNTER MEDICATION Take 1 capsule by mouth daily. Tumeric and Ginger    [provider]  sertraline  (ZOLOFT ) 50 MG tablet Take 1 tablet (50  mg total) by mouth daily. Patient not taking: Reported on 06/12/2020 09/03/19 10/25/20  Timmie Norris, MD    Allergies: Avocado, Banana, Gadolinium derivatives, Iodinated contrast media, Kiwi extract, and Latex    Review of Systems  All other systems reviewed and are negative.   Updated Vital Signs BP (!) 165/86   Pulse (!) 140   Temp 98 F (36.7 C) (Oral)   Resp (!) 22   LMP 05/28/2024 (Approximate)   SpO2 100%   Physical Exam Vitals and nursing note reviewed.  Constitutional:      General: She is not in acute distress.    Appearance: Normal appearance. She is well-developed. She is not toxic-appearing.  HENT:     Head: Normocephalic and atraumatic.  Eyes:     General: Lids are normal.     Conjunctiva/sclera: Conjunctivae normal.     Pupils: Pupils are equal, round, and reactive to light.  Neck:     Thyroid : No thyroid  mass.     Trachea: No tracheal deviation.  Cardiovascular:     Rate and Rhythm: Regular rhythm. Tachycardia present.     Heart sounds: Normal heart sounds. No murmur heard.    No gallop.  Pulmonary:     Effort: Pulmonary effort is normal. No respiratory distress.     Breath sounds: Normal breath sounds. No stridor. No decreased breath sounds, wheezing, rhonchi or rales.  Abdominal:  General: There is no distension.     Palpations: Abdomen is soft.     Tenderness: There is abdominal tenderness in the right lower quadrant, suprapubic area and left lower quadrant. There is guarding. There is no rebound.  Musculoskeletal:        General: No tenderness. Normal range of motion.     Cervical back: Normal range of motion and neck supple.  Skin:    General: Skin is warm and dry.     Findings: No abrasion or rash.  Neurological:     Mental Status: She is alert and oriented to person, place, and time. Mental status is at baseline.     GCS: GCS eye subscore is 4. GCS verbal subscore is 5. GCS motor subscore is 6.     Cranial Nerves: No cranial nerve deficit.      Sensory: No sensory deficit.     Motor: Motor function is intact.  Psychiatric:        Attention and Perception: Attention normal.        Speech: Speech normal.        Behavior: Behavior normal.   Pelvic exam deferred  (all labs ordered are listed, but only abnormal results are displayed) Labs Reviewed  CULTURE, BLOOD (ROUTINE X 2)  CULTURE, BLOOD (ROUTINE X 2)  CBC WITH DIFFERENTIAL/PLATELET  COMPREHENSIVE METABOLIC PANEL WITH GFR  I-STAT CHEM 8, ED  I-STAT CG4 LACTIC ACID, ED    EKG: None  Radiology: No results found.   Procedures   Medications Ordered in the ED  lactated ringers  bolus 1,000 mL (has no administration in time range)  lactated ringers  infusion (has no administration in time range)                                    Medical Decision Making Amount and/or Complexity of Data Reviewed Labs: ordered. Radiology: ordered.  Risk Prescription drug management. Decision regarding hospitalization.   Patient treated with IV pain medication as well as started on Zosyn .  Abdominal CT showed no obvious large abscess.  Case discussed with her GYN doctor who admit the patient.     Final diagnoses:  None    ED Discharge Orders     None          Dasie Faden, MD 07/05/24 1306

## 2024-07-05 NOTE — H&P (Signed)
 Gynecology History and Physical   Chelsea Day is a 28 y.o. female that underwent LAVH, LS, cystoscopy on 06/11/24 (POD#24).  She was seen today in office for increased pain and vaginal discharge.  Throughout her postoperative course, she has had intermittent pelvic pain and intermittent suboptimal control with oral pain medications. At her first postoperative visit, augmentin and flagyl  were prescribed for possible BV and/or cuff cellulitis.   She has since been able to attend travel plans to [place], and adhered to activity restrictions and rode a scooter during her stay.   She has not had vaginal bleeding, but vaginal discharge increased overnight and had new foul odor. She reports fever, chills, and temp of 101 measured at home.   She has been eating appropriately, denies vomiting but has mild nausea with pain exacerbation.  She denies change in urinary habits, but can compound her lower pelvic pain.    OB History     Gravida  3   Para  2   Term  1   Preterm  1   AB  1   Living  2      SAB  1   IAB      Ectopic      Multiple  0   Live Births  2          Past Medical History:  Diagnosis Date   Abnormal uterine bleeding (AUB)    Chronic pelvic pain in female    Dyspareunia in female    Endometriosis determined by laparoscopy    History of allergic urticaria    History of postpartum uterine bleeding 2018   History of pre-eclampsia 2018   and  2021   Irregular periods/menstrual cycles    Lymphadenopathy    pt had extensive work-up done by oncology-- dr federico, all normal including bone marrow bx   Wears glasses    Past Surgical History:  Procedure Laterality Date   CYSTOSCOPY  06/11/2024   Procedure: CYSTOSCOPY;  Surgeon: Lequita Chelsea LABOR, MD;  Location: [facility] OR;  Service: Gynecology;;   IR BONE MARROW BIOPSY & ASPIRATION  11/22/2023   LAPAROSCOPIC APPENDECTOMY  01/07/2022   @[facility]  by Dr CHRISTELLA. Arafeh   LAPAROSCOPIC OVARIAN CYSTECTOMY Right  01/24/2015   Procedure: LAPAROSCOPIC LYSIS OF ADHESION, LAPROSCOPIC RIGHT SALPINGOOOPHORECTOMY, BIOPSY OF RIGHT BOARD LIGAMENT; LYSIS BIOPSY OF ADHESIONS, BIOPSY OF LEFT UTERO LIGAMENT;  Surgeon: Rome Rigg, MD;  Location: [facility] ORS;  Service: Gynecology;  Laterality: Right;   LAPAROSCOPIC VAGINAL HYSTERECTOMY WITH SALPINGECTOMY N/A 06/11/2024   Procedure: HYSTERECTOMY, VAGINAL, LAPAROSCOPY-ASSISTED, WITH LEFT SALPINGECTOMY;  Surgeon: Lequita Chelsea LABOR, MD;  Location: [facility] OR;  Service: Gynecology;  Laterality: N/A;   LAPAROSCOPY  11/08/2011   Procedure: LAPAROSCOPY OPERATIVE;  Surgeon: Oneil FORBES Piety, MD;  Location: [facility] ORS;  Service: Gynecology;  Laterality: Right;   LYMPH NODE BIOPSY Right 11/28/2023   Procedure: LYMPH NODE BIOPSY;  Surgeon: Okey Burns, MD;  Location: [facility];  Service: ENT;  Laterality: Right;   OVARIAN CYST REMOVAL  11/08/2011   Procedure: OVARIAN CYSTECTOMY;  Surgeon: Oneil FORBES Piety, MD;  Location: [facility];  Service: Gynecology;  Laterality: Right;   TONSILLECTOMY Bilateral 11/28/2023   Procedure: TONSILLECTOMY;  Surgeon: Okey Burns, MD;  Location: [facility];  Service: ENT;  Laterality: Bilateral;    AND THREE LYMPH NODES REMOVED   Family History: family history includes Cancer in her father; Colon cancer in her maternal aunt and maternal grandmother; Multiple sclerosis in her  mother. Social History:  reports that she quit smoking about 10 years ago. Her smoking use included cigarettes. She has never used smokeless tobacco. She reports current alcohol use. She reports that she does not use drugs.   Review of Systems - Patient denies fever, chills, SOB, CP, N/V/D.  History   Blood pressure (!) 142/89, pulse (!) 101, temperature 98.2 F (36.8 C), temperature source Oral, resp. rate 18, height 5' 10 (1.778 m), weight 125.2 kg, last menstrual period 05/28/2024, SpO2 99%. Exam Physical Exam   Gen: alert, well appearing, no  distress Chest: nonlabored breathing CV: no peripheral edema Abdomen: soft, TTP in bilateral lower quadrants, mild guarding Ext: no evidence of DVT Pelvic exam in office today: No vulvar lesions, no erythema. No vaginal lesions, thin yellow discharge. Vaginal cuff high in pelvis and visually and digitally intact. No mass felt. Tolerance of exam was worse today than prior postoperative visit, and thus slightly more limited  CTAP 12/4:  IMPRESSION: 1. Hysterectomy with mild postoperative changes - minimal stranding at the vaginal cuff. No fluid collection or other adverse features. 2. No other acute or inflammatory process identified in the non-contrast abdomen or pelvis.  Assessment/Plan: Admit for postoperative pain exacerbation and possible infection, suspect cuff cellulitis.  CTAP 12/4 with reassuring findings of minimal stranding, no fluid collection or abscess. No bowel abnormalities or free air.  Discussed that increased discharge may have been cuff abscess that drained spontaneously.  Labs: CMP wnl, WBC with mild leukocytosis at 13.2, elevated neutrophils.  HGB 13.4. Elevated temperature measured at home, but afebrile at ED. Pain control: Tylenol , motrin , oxycodone . Dilaudid  available for breakthrough pain. DVT Ppx: ambulation and SCDs Diet: regular A/P: Possible developing pelvic infection. No abscess seen on CT, suspect cuff cellulitis.  Will continue IV Zosyn, consider transition to oral antibiotics if 24 hrs afebrile and improvement in WBC.  Culture swab was collected in office.   Chelsea Day 07/05/2024, 5:52 PM

## 2024-07-06 DIAGNOSIS — G8918 Other acute postprocedural pain: Secondary | ICD-10-CM | POA: Diagnosis present

## 2024-07-06 DIAGNOSIS — R519 Headache, unspecified: Secondary | ICD-10-CM | POA: Diagnosis present

## 2024-07-06 DIAGNOSIS — N760 Acute vaginitis: Secondary | ICD-10-CM | POA: Diagnosis present

## 2024-07-06 DIAGNOSIS — N730 Acute parametritis and pelvic cellulitis: Secondary | ICD-10-CM | POA: Diagnosis present

## 2024-07-06 DIAGNOSIS — Z87891 Personal history of nicotine dependence: Secondary | ICD-10-CM | POA: Diagnosis not present

## 2024-07-06 DIAGNOSIS — Y836 Removal of other organ (partial) (total) as the cause of abnormal reaction of the patient, or of later complication, without mention of misadventure at the time of the procedure: Secondary | ICD-10-CM | POA: Diagnosis present

## 2024-07-06 DIAGNOSIS — T8140XA Infection following a procedure, unspecified, initial encounter: Secondary | ICD-10-CM | POA: Diagnosis present

## 2024-07-06 LAB — CBC WITH DIFFERENTIAL/PLATELET
Abs Immature Granulocytes: 0.02 K/uL (ref 0.00–0.07)
Basophils Absolute: 0.1 K/uL (ref 0.0–0.1)
Basophils Relative: 1 %
Eosinophils Absolute: 0.3 K/uL (ref 0.0–0.5)
Eosinophils Relative: 3 %
HCT: 33.8 % — ABNORMAL LOW (ref 36.0–46.0)
Hemoglobin: 10.8 g/dL — ABNORMAL LOW (ref 12.0–15.0)
Immature Granulocytes: 0 %
Lymphocytes Relative: 49 %
Lymphs Abs: 4.9 K/uL — ABNORMAL HIGH (ref 0.7–4.0)
MCH: 28.8 pg (ref 26.0–34.0)
MCHC: 32 g/dL (ref 30.0–36.0)
MCV: 90.1 fL (ref 80.0–100.0)
Monocytes Absolute: 0.6 K/uL (ref 0.1–1.0)
Monocytes Relative: 6 %
Neutro Abs: 4.1 K/uL (ref 1.7–7.7)
Neutrophils Relative %: 41 %
Platelets: 301 K/uL (ref 150–400)
RBC: 3.75 MIL/uL — ABNORMAL LOW (ref 3.87–5.11)
RDW: 13.4 % (ref 11.5–15.5)
WBC: 9.9 K/uL (ref 4.0–10.5)
nRBC: 0 % (ref 0.0–0.2)

## 2024-07-06 LAB — URINE CULTURE: Culture: <10000 — AB

## 2024-07-06 MED ORDER — GABAPENTIN 100 MG PO CAPS
300.0000 mg | ORAL_CAPSULE | Freq: Two times a day (BID) | ORAL | Status: DC
  Administered 2024-07-06 – 2024-07-07 (×3): 300 mg via ORAL
  Filled 2024-07-06 (×3): qty 3

## 2024-07-06 MED ORDER — ONDANSETRON HCL 4 MG/2ML IJ SOLN
4.0000 mg | Freq: Four times a day (QID) | INTRAMUSCULAR | Status: DC | PRN
  Administered 2024-07-06 (×3): 4 mg via INTRAVENOUS
  Filled 2024-07-06 (×4): qty 2

## 2024-07-06 NOTE — Plan of Care (Signed)
  Problem: Clinical Measurements: Goal: Will remain free from infection Outcome: Progressing   Problem: Coping: Goal: Level of anxiety will decrease Outcome: Progressing   Problem: Pain Managment: Goal: General experience of comfort will improve and/or be controlled Outcome: Progressing

## 2024-07-06 NOTE — Progress Notes (Signed)
 Gynecology Progress Note  Chelsea Day is HD#2 for abdominal pain, posthysterectomy cuff cellulitis following LAVH LS, cystoscopy on 06/11/24 (POD#25).  Subjective:  Patient reports improved pain in general, but did have an exacerbation around 3 am. This was accompanied by a wave of nausea without emesis. Otherwise, she is voiding well, tolerating PO, ambulating.  Objective: Blood pressure (!) 145/93, pulse 87, temperature 98.1 F (36.7 C), temperature source Oral, resp. rate 15, height 5' 10 (1.778 m), weight 125.2 kg, last menstrual period 05/28/2024, SpO2 97%.  Physical Exam:  Gen: alert, well appearing, no distress Chest: nonlabored breathing CV: no peripheral edema Abdomen: soft, nondistended, mild TTP in lower quadrants. Improved from yesterday. Ext: No evidence of DVT  Recent Labs    07/05/24 1116 07/06/24 0458  HGB 14.6 10.8*  HCT 43.0 33.8*    Assessment/Plan: Admit for postoperative pain exacerbation and possible infection, suspect cuff cellulitis.  CTAP 12/4 with reassuring findings of minimal stranding, no fluid collection or abscess. No bowel abnormalities or free air.  Discussed that increased discharge may have been cuff abscess that drained spontaneously.  Labs: Likely dilutional effect of CBC this AM, however improvement in WBC from 13.2 > 9.9 this AM. Abs neutrophils normalized. Elevated temperature measured at home, but afebrile since presentation.  Tachycardia resolved.  Pain control: Tylenol , motrin , oxycodone . Dilaudid  available for breakthrough pain. Will add gabapentin . DVT Ppx: ambulation and SCDs Diet: regular A/P: Possible developing pelvic infection.  No abscess seen on CT, suspect cuff cellulitis. Culture swab was collected in office.  Patient has been afebrile since presentation, and mild leukocytosis has resolved, tachycardia resolved. Pain control issues persist, and therefore will continue IV Zosyn , consider transition to oral  antibiotics with symptomatic improvement    LOS: 0 days   [name] 07/06/2024, 8:31 AM

## 2024-07-07 ENCOUNTER — Other Ambulatory Visit (HOSPITAL_COMMUNITY): Payer: Self-pay

## 2024-07-07 LAB — CBC WITH DIFFERENTIAL/PLATELET
Abs Immature Granulocytes: 0.03 K/uL (ref 0.00–0.07)
Basophils Absolute: 0.1 K/uL (ref 0.0–0.1)
Basophils Relative: 1 %
Eosinophils Absolute: 0.4 K/uL (ref 0.0–0.5)
Eosinophils Relative: 4 %
HCT: 32.2 % — ABNORMAL LOW (ref 36.0–46.0)
Hemoglobin: 10.1 g/dL — ABNORMAL LOW (ref 12.0–15.0)
Immature Granulocytes: 0 %
Lymphocytes Relative: 52 %
Lymphs Abs: 4.7 K/uL — ABNORMAL HIGH (ref 0.7–4.0)
MCH: 28.5 pg (ref 26.0–34.0)
MCHC: 31.4 g/dL (ref 30.0–36.0)
MCV: 90.7 fL (ref 80.0–100.0)
Monocytes Absolute: 0.6 K/uL (ref 0.1–1.0)
Monocytes Relative: 6 %
Neutro Abs: 3.4 K/uL (ref 1.7–7.7)
Neutrophils Relative %: 37 %
Platelets: 268 K/uL (ref 150–400)
RBC: 3.55 MIL/uL — ABNORMAL LOW (ref 3.87–5.11)
RDW: 13.4 % (ref 11.5–15.5)
WBC: 9.2 K/uL (ref 4.0–10.5)
nRBC: 0 % (ref 0.0–0.2)

## 2024-07-07 MED ORDER — METRONIDAZOLE 500 MG PO TABS
500.0000 mg | ORAL_TABLET | Freq: Two times a day (BID) | ORAL | 0 refills | Status: AC
  Filled 2024-07-07: qty 28, 14d supply, fill #0

## 2024-07-07 MED ORDER — SULFAMETHOXAZOLE-TRIMETHOPRIM 800-160 MG PO TABS
1.0000 | ORAL_TABLET | Freq: Two times a day (BID) | ORAL | 0 refills | Status: AC
  Filled 2024-07-07: qty 28, 14d supply, fill #0

## 2024-07-07 MED ORDER — METRONIDAZOLE 500 MG PO TABS: 500.0000 mg | ORAL_TABLET | Freq: Two times a day (BID) | ORAL | Status: DC

## 2024-07-07 MED ORDER — DOCUSATE SODIUM 100 MG PO CAPS
100.0000 mg | ORAL_CAPSULE | Freq: Every day | ORAL | 0 refills | Status: AC | PRN
  Filled 2024-07-07: qty 10, 10d supply, fill #0

## 2024-07-07 MED ORDER — ACETAMINOPHEN 325 MG PO TABS
650.0000 mg | ORAL_TABLET | ORAL | 0 refills | Status: AC | PRN
  Filled 2024-07-07: qty 30, 3d supply, fill #0

## 2024-07-07 MED ORDER — LORATADINE 10 MG PO TABS
10.0000 mg | ORAL_TABLET | Freq: Every day | ORAL | Status: DC
  Administered 2024-07-07: 10 mg via ORAL
  Filled 2024-07-07: qty 1

## 2024-07-07 MED ORDER — OXYCODONE HCL 5 MG PO TABS
5.0000 mg | ORAL_TABLET | ORAL | 0 refills | Status: AC | PRN
  Filled 2024-07-07: qty 12, 2d supply, fill #0

## 2024-07-07 MED ORDER — METOCLOPRAMIDE HCL 10 MG PO TABS
10.0000 mg | ORAL_TABLET | Freq: Three times a day (TID) | ORAL | Status: DC | PRN
  Administered 2024-07-07: 10 mg via ORAL
  Filled 2024-07-07: qty 1

## 2024-07-07 MED ORDER — METOCLOPRAMIDE HCL 10 MG PO TABS
10.0000 mg | ORAL_TABLET | Freq: Three times a day (TID) | ORAL | 0 refills | Status: AC | PRN
  Filled 2024-07-07: qty 20, 7d supply, fill #0

## 2024-07-07 MED ORDER — FAMOTIDINE 20 MG PO TABS
20.0000 mg | ORAL_TABLET | Freq: Two times a day (BID) | ORAL | Status: DC
  Administered 2024-07-07: 20 mg via ORAL
  Filled 2024-07-07: qty 1

## 2024-07-07 MED ORDER — FAMOTIDINE 20 MG PO TABS
20.0000 mg | ORAL_TABLET | Freq: Two times a day (BID) | ORAL | 0 refills | Status: DC
  Filled 2024-07-07: qty 20, 10d supply, fill #0

## 2024-07-07 MED ORDER — ONDANSETRON 4 MG PO TBDP
4.0000 mg | ORAL_TABLET | Freq: Three times a day (TID) | ORAL | 0 refills | Status: AC | PRN
  Filled 2024-07-07: qty 20, 7d supply, fill #0

## 2024-07-07 MED ORDER — SULFAMETHOXAZOLE-TRIMETHOPRIM 800-160 MG PO TABS
1.0000 | ORAL_TABLET | Freq: Two times a day (BID) | ORAL | Status: DC
  Filled 2024-07-07: qty 1

## 2024-07-07 NOTE — Progress Notes (Signed)
 Gynecology Progress Note  Chelsea Day is HD#3 for abdominal pain, posthysterectomy cuff cellulitis following LAVH LS, cystoscopy on 06/11/24 (POD#26).  Subjective:  Patient onset of nausea yesterday, and had emesis with breakfast and dinner due to decreased appetite.  She reports burning vaginal pain is improved, but has back pain and headache as well.   Objective: Blood pressure (!) 113/53, pulse 78, temperature 98.8 F (37.1 C), temperature source Oral, resp. rate 16, height 5' 10 (1.778 m), weight 125.2 kg, last menstrual period 05/28/2024, SpO2 96%.  Physical Exam:  Gen: alert, well appearing, no distress Chest: nonlabored breathing CV: no peripheral edema Abdomen: soft, nondistended, mild TTP in lower quadrants. Improved from yesterday. Ext: No evidence of DVT  Recent Labs    07/06/24 0458 07/07/24 0531  HGB 10.8* 10.1*  HCT 33.8* 32.2*    Assessment/Plan: Admit for postoperative pain exacerbation and possible infection, suspect cuff cellulitis.  CTAP 12/4 with reassuring findings of minimal stranding, no fluid collection or abscess. No bowel abnormalities or free air.  Discussed that increased discharge may have been cuff abscess that drained spontaneously.  Labs: Likely dilutional effect of CBC this AM, however improvement in WBC from 13.2 > 9.9 > 9.2 this AM. Abs neutrophils normalized. Blood cultures WNL prelim Urine culture without significant growth Elevated temperature measured at home, but afebrile since presentation.  Tachycardia resolved.  Pain control: Tylenol , motrin , oxycodone . Dilaudid  available for breakthrough pain. Gabapentin  added yesterday. Will discontinue Motrin  for nausea and add pepcid .  DVT Ppx: ambulation and SCDs Diet: regular A/P: Possible developing pelvic infection.  No abscess seen on CT, suspect cuff cellulitis. Culture swab was collected in office.  Patient has been afebrile since presentation, and mild leukocytosis has  resolved, tachycardia resolved. Objective measures are quite reassuring, including lab and imaging, however her symptoms persist and she does not feel ready for de-escalation or homegoing.  I discussed goals today of optimizing symptoms, then will prioritize transition to PO-only regimen.     LOS: 1 day   Chelsea Day 07/07/2024, 7:42 AM

## 2024-07-07 NOTE — Progress Notes (Signed)
 Discharge meds in a secure bag delivered to patient in room by this RN

## 2024-07-07 NOTE — Progress Notes (Signed)
 I spoke with patient to check in.  She has a lingering headache but was able to keep down her most recent meal.  She has not received any antiemetics today because no emesis has been observed or confirmed after the fact by RN staff. This was told to me yesterday as well.   I discussed our current treatment for suspected cuff cellulitis and pain control.   Since admission, her objective workup has been quite reassuring, including always afebrile and with now two days of normal WBC, which was a max of 13 on admission. Urine culture and blood cultures reassuring.  IV antibiotics were done as a conservative measure, however I believe that oral therapy is appropriate moving forward.  I discussed the possibility of discharge home today, as she has not been febrile during admission and with no impressive leukocytosis. I understand that there are also patients in ED waiting for beds, and continued inpatient management for Chelsea Day is not clinically necessary.  She is agreeable to going home and admits she may prefer to be in her own environment and sleeping in her own bed.  I will have our office recheck patient on Monday, and will work in as needed.

## 2024-07-07 NOTE — Progress Notes (Signed)
   07/07/24 1350  TOC Brief Assessment  Insurance and Status Reviewed  Patient has primary care physician Yes  Home environment has been reviewed Home with spouse  Prior level of function: Independent  Prior/Current Home Services No current home services  Social Drivers of Health Review SDOH reviewed no interventions necessary  Readmission risk has been reviewed Yes  Transition of care needs no transition of care needs at this time

## 2024-07-09 NOTE — Discharge Summary (Signed)
 Gynecology Discharge Summary  Chelsea Day is a 28 y.o. female that presented on 07/05/2024 for pelvic pain and vaginal discharge on POD#24 following LAVH, LS, cystoscopy on 06/11/2024. She also reported subjective chills.  She was sent to ED for possible pelvic abscess or infection, imaging, and lab evaluation.   On evaluation, she was afebrile and with mild leukocytosis at 13. CT AP was with reassuring findings of minimal stranding, no fluid collection or abscess. No bowel abnormalities or free air was noted. Although objective workup was reassuring, she continued to report increased pain, and was therefore admitted to observation and treatment for cuff cellulitis. She received IV antibiotics. The following morning, WBC count had normalized.   During her stay, she had a headache that was suspected to be from gabapentin  adverse effect. She also had nausea that improved. She was transitioned to oral antibiotics.   On HD#3, it was determined that outpatient management was appropriate. She was discharged home in stable condition.     Hemoglobin  Date Value Ref Range Status  07/07/2024 10.1 (L) 12.0 - 15.0 g/dL Final  95/96/7974 85.9 12.0 - 15.0 g/dL Final  92/85/7978 86.8 11.1 - 15.9 g/dL Final   HCT  Date Value Ref Range Status  07/07/2024 32.2 (L) 36.0 - 46.0 % Final   Hematocrit  Date Value Ref Range Status  02/13/2020 40.8 34.0 - 46.6 % Final    Physical Exam:  Gen: alert, well appearing, no distress Chest: nonlabored breathing CV: no peripheral edema Abdomen: soft, nondistended Ext: no evidence of DVT  Discharge Diagnoses: Postoperative state, pelvic pain  Discharge Information: Date: 07/09/2024 Activity: Pelvic rest, as tolerated Diet: routine Medications: Tylenol , motrin , oxycodone , bactrim , flagyl , reglan  Condition: stable Instructions: Discussed prior to discharge.  Discharge to: Home  Follow-up Information     [place] Follow up.    Contact information: [address] [place] [address] [phone]                  Chelsea Day 07/09/2024, 12:16 PM

## 2024-07-10 LAB — CULTURE, BLOOD (ROUTINE X 2)
Culture: NO GROWTH
Culture: NO GROWTH
Special Requests: ADEQUATE
Special Requests: ADEQUATE

## 2024-07-19 ENCOUNTER — Emergency Department (HOSPITAL_BASED_OUTPATIENT_CLINIC_OR_DEPARTMENT_OTHER)
Admission: EM | Admit: 2024-07-19 | Discharge: 2024-07-19 | Disposition: A | Discharge status: Home / Self Care | Attending: Emergency Medicine | Admitting: Emergency Medicine

## 2024-07-19 ENCOUNTER — Other Ambulatory Visit: Payer: Self-pay

## 2024-07-19 DIAGNOSIS — T782XXA Anaphylactic shock, unspecified, initial encounter: Secondary | ICD-10-CM | POA: Insufficient documentation

## 2024-07-19 DIAGNOSIS — Z91040 Latex allergy status: Secondary | ICD-10-CM | POA: Insufficient documentation

## 2024-07-19 DIAGNOSIS — Z91010 Allergy to peanuts: Secondary | ICD-10-CM | POA: Diagnosis not present

## 2024-07-19 DIAGNOSIS — T7840XA Allergy, unspecified, initial encounter: Secondary | ICD-10-CM | POA: Diagnosis present

## 2024-07-19 MED ORDER — FAMOTIDINE 20 MG PO TABS: 20.0000 mg | ORAL_TABLET | Freq: Two times a day (BID) | ORAL | 0 refills | Status: AC

## 2024-07-19 MED ORDER — EPINEPHRINE 0.3 MG/0.3ML IJ SOAJ: 0.3000 mg | INTRAMUSCULAR | 2 refills | Status: AC | PRN

## 2024-07-19 MED ORDER — PREDNISONE 20 MG PO TABS: ORAL_TABLET | ORAL | 0 refills | Status: AC

## 2024-07-19 MED ORDER — FAMOTIDINE IN NACL 20-0.9 MG/50ML-% IV SOLN
20.0000 mg | Freq: Once | INTRAVENOUS | Status: AC
  Administered 2024-07-19: 10:00:00 20 mg via INTRAVENOUS
  Filled 2024-07-19: qty 50

## 2024-07-19 MED ORDER — DIPHENHYDRAMINE HCL 50 MG/ML IJ SOLN
25.0000 mg | Freq: Once | INTRAMUSCULAR | Status: AC
  Administered 2024-07-19: 10:00:00 25 mg via INTRAVENOUS
  Filled 2024-07-19: qty 1

## 2024-07-19 MED ORDER — METHYLPREDNISOLONE SODIUM SUCC 125 MG IJ SOLR
125.0000 mg | Freq: Once | INTRAMUSCULAR | Status: AC
  Administered 2024-07-19: 10:00:00 125 mg via INTRAVENOUS
  Filled 2024-07-19: qty 2

## 2024-07-19 NOTE — Discharge Instructions (Signed)
 1.  It sounds like you had a severe reaction this morning that would likely qualify as an anaphylactic reaction to a minimal exposure to food products likely containing nuts.  Keep your EpiPen  with you at all times.  Avoid any nut products at all and also any products that you know you have allergies to.  Schedule a repeat visit to your allergist. 2.  Take prednisone  starting today for the next 4 days.  Take Pepcid  twice a day for the next 5 to 7 days.  Take Benadryl  25 mg 3 times a day for the next day and then if needed. Return to the emergency department immediately if you have rebound of symptoms.

## 2024-07-19 NOTE — ED Triage Notes (Signed)
 Presents to ED for use of Epipen  this AM. Pt states she was not exposed to any of her allergens but started to have lip swelling, difficulty breathing, generalized itchiness. Epipen  administered at 0725. States some symptoms have resolved, but still has itchiness and feels congested.

## 2024-07-19 NOTE — ED Notes (Signed)
 DC paperwork given and verbally understood.

## 2024-07-19 NOTE — ED Provider Notes (Signed)
 [facility] EMERGENCY DEPARTMENT AT [facility] Provider Note   CSN: [id number] Arrival date & time: 07/19/24  9186     Patient presents with: Allergic Reaction   Chelsea Day is a 28 y.o. female.   HPI Reports that she has a history of 2 anaphylactic reactions.  1 was to IV contrast material the other 1 was when she was 28 years old and was attributed to a nut allergy.  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  She does have an EpiPen  on hand.  This morning she got out of bed feeling normal.  She emptied her daughter's lunch box which contained a wrapper for a [employer].  Within minutes patient reports that she started to get extremely swollen lips, felt difficult to swallow and breathe and started turning red all over.  She was able to call for her family member who got her EpiPen  and administered it for her.  Symptoms started to improve after the EpiPen  was given.  At this time she reports she still feels kind of itchy and feels a bit of a lump when she swallows but is much better than previously.  Patient reports that she was cleared by her allergist of nut allergies by testing several years ago and she has been eating nuts without allergic reaction.  She reports she had not eaten anything today and felt fine until she emptied the lunch box and had touched the wrappers that were in it.    Prior to Admission medications  Medication Sig Start Date End Date Taking? Authorizing Provider  EPINEPHrine  0.3 mg/0.3 mL IJ SOAJ injection Inject 0.3 mg into the muscle as needed for anaphylaxis. 07/19/24  Yes Armenta Canning, MD  famotidine  (PEPCID ) 20 MG tablet Take 1 tablet (20 mg total) by mouth 2 (two) times daily. 07/19/24  Yes Armenta Canning, MD  predniSONE  (DELTASONE ) 20 MG tablet 2 tabs po daily x 4 days 07/19/24  Yes Armenta Canning, MD  acetaminophen  (TYLENOL ) 325 MG tablet Take 2 tablets (650 mg total)  by mouth every 4 (four) hours as needed for mild pain (pain score 1-3) or fever (temperature > 101.5.). 07/07/24   Lequita Evalene LABOR, MD  ascorbic acid (VITAMIN C) 500 MG tablet Take 500 mg by mouth daily.    [provider]  docusate sodium  (COLACE) 100 MG capsule Take 1 capsule (100 mg total) by mouth daily as needed for mild constipation. 07/07/24   Lequita Evalene LABOR, MD  ELDERBERRY PO Take 1 capsule by mouth daily.    [provider]  EPINEPHrine  0.3 mg/0.3 mL IJ SOAJ injection Inject 0.3 mg into the muscle as needed for anaphylaxis. 01/26/24   Jeneal Danita Macintosh, MD  ibuprofen  (ADVIL ) 600 MG tablet Take 1 tablet (600 mg total) by mouth every 6 (six) hours. Patient taking differently: Take 600 mg by mouth every 6 (six) hours as needed for mild pain (pain score 1-3) or fever (or headaches). 06/11/24   Lequita Evalene LABOR, MD  metoCLOPramide  (REGLAN ) 10 MG tablet Take 1 tablet (10 mg total) by mouth every 8 (eight) hours as needed for nausea (and headache). 07/07/24   Lequita Evalene LABOR, MD  metroNIDAZOLE  (FLAGYL ) 500 MG tablet Take 1 tablet (500 mg total) by mouth every 12 (twelve) hours for 14 days. 07/07/24 07/21/24  Lequita Evalene LABOR, MD  Multiple Vitamins-Minerals (MULTIVITAMIN GUMMIES ADULTS PO) Take 2 Pieces by mouth See admin instructions. Chew 2 gummies  by mouth once a day    [provider]  ondansetron  (ZOFRAN -ODT) 4 MG disintegrating tablet Take 1 tablet (4 mg total) by mouth every 8 (eight) hours as needed for nausea or vomiting. 07/07/24   Lequita Evalene LABOR, MD  oxyCODONE  (OXY IR/ROXICODONE ) 5 MG immediate release tablet Take 1 tablet (5 mg total) by mouth every 4 (four) hours as needed for moderate pain (pain score 4-6) or severe pain (pain score 7-10). 07/07/24   Lequita Evalene LABOR, MD  sulfamethoxazole -trimethoprim  (BACTRIM  DS) 800-160 MG tablet Take 1 tablet by mouth every 12 (twelve) hours for 14 days. 07/07/24 07/21/24  Lequita Evalene LABOR, MD   TURMERIC-GINGER PO Take 2 capsules by mouth daily.    [provider]  sertraline  (ZOLOFT ) 50 MG tablet Take 1 tablet (50 mg total) by mouth daily. Patient not taking: Reported on 06/12/2020 09/03/19 10/25/20  Timmie Norris, MD    Allergies: Avocado, Banana, Gadolinium derivatives, Iodinated contrast media, Kiwi extract, Peanut-containing drug products, and Latex    Review of Systems  Updated Vital Signs BP (!) 108/55   Pulse 77   Temp 98.1 F (36.7 C) (Oral)   Resp 19   Ht 5' 10 (1.778 m)   Wt 125 kg   SpO2 100%   BMI 39.54 kg/m   Physical Exam Constitutional:      Comments: Alert nontoxic speaking in full sentences no respiratory distress.  HENT:     Mouth/Throat:     Pharynx: Oropharynx is clear.     Comments: Posterior airway clearance no stridor Eyes:     Extraocular Movements: Extraocular movements intact.  Cardiovascular:     Rate and Rhythm: Normal rate and regular rhythm.  Pulmonary:     Effort: Pulmonary effort is normal.     Breath sounds: Normal breath sounds.  Abdominal:     General: There is no distension.     Palpations: Abdomen is soft.     Tenderness: There is no abdominal tenderness. There is no guarding.  Musculoskeletal:        General: No swelling. Normal range of motion.     Cervical back: Neck supple.     Right lower leg: No edema.     Left lower leg: No edema.  Skin:    General: Skin is warm and dry.     Comments: Patient has mild facial flushing and mild flushing on the chest.  No visible urticaria.  Neurological:     General: No focal deficit present.     Mental Status: She is oriented to person, place, and time.  Psychiatric:        Mood and Affect: Mood normal.     (all labs ordered are listed, but only abnormal results are displayed) Labs Reviewed - No data to display  EKG: EKG Interpretation Date/Time:  Thursday July 19 2024 08:43:37 EST Ventricular Rate:  98 PR Interval:  156 QRS Duration:  88 QT  Interval:  350 QTC Calculation: 446 R Axis:   85  Text Interpretation: Normal sinus rhythm Nonspecific ST abnormality Abnormal ECG When compared with ECG of 19-Oct-2023 18:25, PREVIOUS ECG IS PRESENT no sig change from previous Confirmed by Armenta Canning [phone]) on 07/19/2024 8:48:50 AM  Radiology: No results found.   Procedures   Medications Ordered in the ED  methylPREDNISolone  sodium succinate (SOLU-MEDROL ) 125 mg/2 mL injection 125 mg (125 mg Intravenous Given 07/19/24 0930)  diphenhydrAMINE  (BENADRYL ) injection 25 mg (25 mg Intravenous Given 07/19/24 0931)  famotidine  (PEPCID ) IVPB 20  mg premix (0 mg Intravenous Stopped 07/19/24 1004)                                    Medical Decision Making  By description, patient had an anaphylactic reaction at home.  Her father who administered EpiPen  states that her lips look like she had just gotten collagen injections and the patient was having trouble swallowing and breathing with generalized erythema.  On arrival she has minimal associated symptoms and has improved significantly but still has some visible flushing and subjective dysphagia.  Will proceed with IV Solu-Medrol  Benadryl  and Pepcid .  Will continue to observe for 2 more hours.  Patient had her EpiPen  approximately 2 hours prior to the assessment.  Reassessment 10: 25.  Patient reports she feels completely better.  Flushing has resolved.  She has no longer any globus sensation or shortness of breath.  Given the rapid onset of symptoms with tactile contact with wrapper likely containing nut products, patient is counseled against any contact with nut products and having EpiPen  on hand at all times.  She will follow-up for the allergist for repeat evaluation.  Patient is aware of how to use EpiPen  and immediate return precautions.     Final diagnoses:  Allergic reaction, initial encounter  Anaphylaxis, initial encounter    ED Discharge Orders          Ordered    predniSONE   (DELTASONE ) 20 MG tablet        07/19/24 1030    famotidine  (PEPCID ) 20 MG tablet  2 times daily        07/19/24 1030    EPINEPHrine  0.3 mg/0.3 mL IJ SOAJ injection  As needed        07/19/24 1030               Armenta Canning, MD 07/19/24 1036
